# Patient Record
Sex: Female | Born: 1945 | Race: White | Hispanic: No | Marital: Married | Smoking: Former smoker
Health system: Southern US, Community
[De-identification: ages and names within clinical notes are randomized; demographics above are authoritative.]

## PROBLEM LIST (undated history)

## (undated) DIAGNOSIS — I2510 Atherosclerotic heart disease of native coronary artery without angina pectoris: Secondary | ICD-10-CM

## (undated) DIAGNOSIS — J449 Chronic obstructive pulmonary disease, unspecified: Secondary | ICD-10-CM

## (undated) DIAGNOSIS — M1990 Unspecified osteoarthritis, unspecified site: Secondary | ICD-10-CM

## (undated) DIAGNOSIS — I219 Acute myocardial infarction, unspecified: Secondary | ICD-10-CM

## (undated) DIAGNOSIS — G459 Transient cerebral ischemic attack, unspecified: Secondary | ICD-10-CM

## (undated) DIAGNOSIS — T7840XA Allergy, unspecified, initial encounter: Secondary | ICD-10-CM

## (undated) DIAGNOSIS — F32A Depression, unspecified: Secondary | ICD-10-CM

## (undated) DIAGNOSIS — F329 Major depressive disorder, single episode, unspecified: Secondary | ICD-10-CM

## (undated) DIAGNOSIS — M810 Age-related osteoporosis without current pathological fracture: Secondary | ICD-10-CM

## (undated) HISTORY — DX: Age-related osteoporosis without current pathological fracture: M81.0

## (undated) HISTORY — DX: Transient cerebral ischemic attack, unspecified: G45.9

## (undated) HISTORY — DX: Allergy, unspecified, initial encounter: T78.40XA

## (undated) HISTORY — DX: Depression, unspecified: F32.A

## (undated) HISTORY — DX: Chronic obstructive pulmonary disease, unspecified: J44.9

## (undated) HISTORY — PX: HAND SURGERY: SHX662

## (undated) HISTORY — DX: Atherosclerotic heart disease of native coronary artery without angina pectoris: I25.10

## (undated) HISTORY — PX: FOOT SURGERY: SHX648

## (undated) HISTORY — PX: CATARACT EXTRACTION: SUR2

## (undated) HISTORY — DX: Acute myocardial infarction, unspecified: I21.9

## (undated) HISTORY — PX: NASAL SEPTUM SURGERY: SHX37

## (undated) HISTORY — PX: SHOULDER SURGERY: SHX246

## (undated) HISTORY — PX: THORACOTOMY: SUR1349

## (undated) HISTORY — PX: TONSILLECTOMY: SUR1361

## (undated) HISTORY — DX: Major depressive disorder, single episode, unspecified: F32.9

---
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
## 1993-05-05 DIAGNOSIS — G459 Transient cerebral ischemic attack, unspecified: Secondary | ICD-10-CM

## 1993-05-05 HISTORY — DX: Transient cerebral ischemic attack, unspecified: G45.9

## 2003-11-20 ENCOUNTER — Other Ambulatory Visit: Admission: RE | Admit: 2003-11-20 | Discharge: 2003-11-20 | Payer: Self-pay | Admitting: Obstetrics and Gynecology

## 2004-12-19 ENCOUNTER — Other Ambulatory Visit: Admission: RE | Admit: 2004-12-19 | Discharge: 2004-12-19 | Payer: Self-pay | Admitting: Obstetrics and Gynecology

## 2005-01-29 ENCOUNTER — Encounter: Admission: RE | Admit: 2005-01-29 | Discharge: 2005-02-11 | Payer: Self-pay | Admitting: Rheumatology

## 2005-05-23 ENCOUNTER — Ambulatory Visit: Payer: Self-pay | Admitting: Emergency Medicine

## 2005-05-26 ENCOUNTER — Ambulatory Visit: Payer: Self-pay | Admitting: Emergency Medicine

## 2005-06-23 ENCOUNTER — Ambulatory Visit: Payer: Self-pay | Admitting: Emergency Medicine

## 2005-10-29 ENCOUNTER — Ambulatory Visit: Payer: Self-pay | Admitting: Internal Medicine

## 2005-11-14 ENCOUNTER — Encounter (INDEPENDENT_AMBULATORY_CARE_PROVIDER_SITE_OTHER): Payer: Self-pay | Admitting: *Deleted

## 2005-11-14 ENCOUNTER — Ambulatory Visit: Payer: Self-pay | Admitting: Internal Medicine

## 2005-12-22 ENCOUNTER — Other Ambulatory Visit: Admission: RE | Admit: 2005-12-22 | Discharge: 2005-12-22 | Payer: Self-pay | Admitting: Obstetrics and Gynecology

## 2006-10-29 ENCOUNTER — Ambulatory Visit: Payer: Self-pay | Admitting: Emergency Medicine

## 2006-11-30 ENCOUNTER — Ambulatory Visit: Payer: Self-pay | Admitting: Emergency Medicine

## 2007-01-08 DIAGNOSIS — J930 Spontaneous tension pneumothorax: Secondary | ICD-10-CM

## 2007-01-08 DIAGNOSIS — E7800 Pure hypercholesterolemia, unspecified: Secondary | ICD-10-CM

## 2007-01-08 DIAGNOSIS — J939 Pneumothorax, unspecified: Secondary | ICD-10-CM | POA: Insufficient documentation

## 2007-01-08 DIAGNOSIS — R05 Cough: Secondary | ICD-10-CM

## 2007-01-08 DIAGNOSIS — J309 Allergic rhinitis, unspecified: Secondary | ICD-10-CM

## 2007-01-08 DIAGNOSIS — G43909 Migraine, unspecified, not intractable, without status migrainosus: Secondary | ICD-10-CM | POA: Insufficient documentation

## 2007-01-08 DIAGNOSIS — J438 Other emphysema: Secondary | ICD-10-CM | POA: Insufficient documentation

## 2007-01-11 ENCOUNTER — Ambulatory Visit: Payer: Self-pay | Admitting: Emergency Medicine

## 2007-12-22 ENCOUNTER — Ambulatory Visit: Payer: Self-pay | Admitting: Emergency Medicine

## 2008-03-07 ENCOUNTER — Ambulatory Visit: Payer: Self-pay | Admitting: Emergency Medicine

## 2008-03-07 DIAGNOSIS — J449 Chronic obstructive pulmonary disease, unspecified: Secondary | ICD-10-CM

## 2008-09-11 ENCOUNTER — Emergency Department (HOSPITAL_BASED_OUTPATIENT_CLINIC_OR_DEPARTMENT_OTHER): Admission: EM | Admit: 2008-09-11 | Discharge: 2008-09-11 | Payer: Self-pay | Admitting: Emergency Medicine

## 2008-09-21 ENCOUNTER — Encounter: Admission: RE | Admit: 2008-09-21 | Discharge: 2008-09-21 | Payer: Self-pay | Admitting: Family Medicine

## 2009-04-10 ENCOUNTER — Ambulatory Visit: Payer: Self-pay | Admitting: Emergency Medicine

## 2009-04-10 DIAGNOSIS — R079 Chest pain, unspecified: Secondary | ICD-10-CM

## 2009-04-10 LAB — CONVERTED CEMR LAB
BUN: 12 mg/dL (ref 6–23)
CO2: 28 meq/L (ref 19–32)
Calcium: 9.6 mg/dL (ref 8.4–10.5)
GFR calc non Af Amer: 59.46 mL/min (ref >60)
Glucose, Bld: 72 mg/dL (ref 70–99)
Potassium: 3.8 meq/L (ref 3.5–5.1)

## 2009-04-11 ENCOUNTER — Ambulatory Visit: Payer: Self-pay | Admitting: Cardiology

## 2009-04-25 ENCOUNTER — Ambulatory Visit: Payer: Self-pay | Admitting: Emergency Medicine

## 2010-04-15 ENCOUNTER — Ambulatory Visit: Payer: Self-pay | Admitting: Emergency Medicine

## 2010-05-05 HISTORY — PX: CORONARY ANGIOPLASTY WITH STENT PLACEMENT: SHX49

## 2010-05-14 ENCOUNTER — Encounter: Payer: Self-pay | Admitting: Emergency Medicine

## 2010-05-27 ENCOUNTER — Encounter: Payer: Self-pay | Admitting: Family Medicine

## 2010-06-06 NOTE — Assessment & Plan Note (Signed)
Summary: COPD, chronic cough   Visit Type:  Follow-up  CC:  COPD...pt c/o increased dry and hacky cough...no breathing changes.  History of Present Illness: Ms. Deweese is a 65 year old woman with mild COPD and hyperinflation per her pulmonary function testing.  Also with chronic cough, has been treated for GERD and with loratadine/nasal steroid. The GERD therapy did not seem to change the cough, but she may have benefitted from the allergy rx.   ROS: Still with daily cough - paroxysms. Non-productive. Has been rx for allergies before - immunotherapy.  No GERD sx.   ROV 04/10/09 -- has been doing well for the last yr on Spiriva until about 3 -4 weeks ago. At that time she developed R sided CP that feels "deep", worse with inspiration. Some associated SOB, also with more cough. She has been having persistant dry cough, was better for a while without intervention (no longer on an allergy regimen).   ROV 04/25/09 -- returns for f/u cough. CT scan of the chest reassuring, no evidence PE. Still with dry cough. Has been treated for GERD and allergies in the past without great change in the cough.   ROV 04/15/10 -- F/u for her chronic cough, mild COPD. She still has daily cough, cycles of 3-7 days o fterrible paroxysmal cough, then back to her usual cough pattern. Has been treated in past for both GERD and allergies. Taking Spiriva in the am, never needs ProAir, breathing is stable. Not currently on loratadine/nasal steroid. No PND, no GERD signs.   Current Medications (verified): 1)  Topamax 100 Mg  Tabs (Topiramate) .... Take 1 Tablet By Mouth Once A Day 2)  Zoloft 50 Mg  Tabs (Sertraline Hcl) .... Take 1 Tablet By Mouth Once A Day 3)  Boniva 150 Mg  Tabs (Ibandronate Sodium) .... Take 1 Tablet Once Monthly 4)  Multivitamins   Tabs (Multiple Vitamin) .... Take 1 Tablet By Mouth Once A Day 5)  Calcium 600 1500 Mg  Tabs (Calcium Carbonate) .... Take 1 Tablet By Mouth Once A Day 6)  Spiriva Handihaler  18 Mcg  Caps (Tiotropium Bromide Monohydrate) .... Inhale One Capule Using Inhaler Once A Day 7)  Fish Oil 1000 Mg Caps (Omega-3 Fatty Acids) .... 2 By Mouth Daily 8)  Proventil Hfa 108 (90 Base) Mcg/act Aers (Albuterol Sulfate) .... 2 Puffs Four Times Daily As Needed 9)  Vitamin D 2000 Unit Tabs (Cholecalciferol) .Marland Kitchen.. 1 By Mouth Daily  Allergies (verified): 1)  ! Ultram 2)  ! Naprosyn  Vital Signs:  Patient profile:   65 year old female Height:      63 inches (160.02 cm) Weight:      121 pounds (55.00 kg) BMI:     21.51 O2 Sat:      93 % on Room air Temp:     98.0 degrees F (36.67 degrees C) oral Pulse rate:   75 / minute BP sitting:   120 / 76  (right arm) Cuff size:   regular  Vitals Entered By: Michel Bickers CMA (April 15, 2010 2:47 PM)  O2 Sat at Rest %:  93 O2 Flow:  Room air CC: COPD...pt c/o increased dry,  hacky cough...no breathing changes Comments Medications reviewed with patient Michel Bickers CMA  April 15, 2010 2:57 PM   Physical Exam  General:  well developed, well nourished, in no acute distress Head:  normocephalic and atraumatic Eyes:  conjunctiva and sclera clear Nose:  no deformity, discharge, inflammation, or lesions Mouth:  no deformity or lesions Neck:  no masses, thyromegaly, or abnormal cervical nodes Chest Wall:  no deformities noted, not tender to palp Lungs:  distant, clear bilaterally to auscultation and percussion Heart:  regular rate and rhythm, S1, S2 without murmurs, rubs, gallops, or clicks Abdomen:  not examined Msk:  no deformity or scoliosis noted with normal posture Extremities:  no clubbing, cyanosis, edema, or deformity noted; no cords Neurologic:  non-focal Skin:  intact without lesions or rashes Psych:  alert and cooperative; normal mood and affect; normal attention span and concentration   Impression & Recommendations:  Problem # 1:  COPD (ICD-496)  Problem # 2:  COUGH, CHRONIC (ICD-786.2) believe we need to r/o any  anatomical cause or abnormality, will refer for ENT eval consider restarting allergy and GERD regimens.   Medications Added to Medication List This Visit: 1)  Fish Oil 1000 Mg Caps (Omega-3 fatty acids) .... 2 by mouth daily 2)  Vitamin D 2000 Unit Tabs (Cholecalciferol) .Marland Kitchen.. 1 by mouth daily  Other Orders: ENT Referral (ENT) Est. Patient Level IV (27253)  Patient Instructions: 1)  Please continue your Spiriva once daily and Proventil  2)  We will refer you to ENT to evaluate your cough, examine your throat with laryngoscopy.  3)  Follow up with Dr Delton Coombes in 1 yr or as needed  Prescriptions: PROVENTIL HFA 108 (90 BASE) MCG/ACT AERS (ALBUTEROL SULFATE) 2 puffs four times daily as needed  #1 x 5   Entered and Authorized by:   Leslye Peer MD   Signed by:   Leslye Peer MD on 04/15/2010   Method used:   Electronically to        [facility]  [address] [phone]* (retail)       [address] [place], [place]  [address]       Ph: [phone] or [phone]       Fax: [phone]   RxID:   [id number] SPIRIVA HANDIHALER 18 MCG  CAPS (TIOTROPIUM BROMIDE MONOHYDRATE) Inhale one capule using inhaler once a day  #30 x 11   Entered and Authorized by:   Leslye Peer MD   Signed by:   Leslye Peer MD on 04/15/2010   Method used:   Electronically to        [facility]  [address] [phone]* (retail)       [address] [place], [place]  [address]       Ph: [phone] or [phone]       Fax: [phone]   RxID:   [id number]    Immunization History:  Influenza Immunization History:    Influenza:  historical (02/02/2010)

## 2010-06-06 NOTE — Consult Note (Signed)
Summary: [facility]  [facility]   Imported By: Lennie Odor 05/31/2010 14:07:52  _____________________________________________________________________  External Attachment:    Type:   Image     Comment:   External Document

## 2010-07-23 ENCOUNTER — Telehealth: Payer: Self-pay | Admitting: Emergency Medicine

## 2010-07-23 NOTE — Telephone Encounter (Signed)
Pt needs either an OV or to go to the ED to be evaluated.

## 2010-07-23 NOTE — Telephone Encounter (Signed)
Called and spoke with pt and she c/o discomfort on her right side and the middle of her back and some increased SOB x 3-4 weeks. Patient rate pain 4/10. Pt is not taking any medications for the pain. Pt would like Dr. Kavin Leech recommendations. Please advise. Thanks. Carver Fila, MA  Allergies  Allergen Reactions  . Naproxen   . Tramadol Hcl

## 2010-07-23 NOTE — Telephone Encounter (Signed)
Called and spoke with pt and advised her of RB recs. She verbalized understanding and states she will go to the ED if her pain gets worse. Pt made an apt to see TP 07/30/10 at 2:00 [place], [place]

## 2010-07-29 ENCOUNTER — Encounter: Payer: Self-pay | Admitting: Adult Health

## 2010-07-30 ENCOUNTER — Ambulatory Visit (INDEPENDENT_AMBULATORY_CARE_PROVIDER_SITE_OTHER): Payer: BC Managed Care – PPO | Admitting: Adult Health

## 2010-07-30 ENCOUNTER — Ambulatory Visit (INDEPENDENT_AMBULATORY_CARE_PROVIDER_SITE_OTHER)
Admission: RE | Admit: 2010-07-30 | Discharge: 2010-07-30 | Disposition: A | Discharge status: Home / Self Care | Payer: BC Managed Care – PPO | Source: Ambulatory Visit | Attending: Adult Health | Admitting: Adult Health

## 2010-07-30 ENCOUNTER — Encounter: Payer: Self-pay | Admitting: Adult Health

## 2010-07-30 VITALS — BP 112/66 | HR 87 | Temp 98.6°F | Ht 62.5 in | Wt 118.8 lb

## 2010-07-30 DIAGNOSIS — R071 Chest pain on breathing: Secondary | ICD-10-CM

## 2010-07-30 DIAGNOSIS — R0781 Pleurodynia: Secondary | ICD-10-CM | POA: Insufficient documentation

## 2010-07-30 DIAGNOSIS — R05 Cough: Secondary | ICD-10-CM

## 2010-07-30 NOTE — Assessment & Plan Note (Signed)
Controlled at present  No evidence of active infection  cxr pending.   Plan:  Cont on same meds.

## 2010-07-30 NOTE — Assessment & Plan Note (Signed)
Right sided pleuritic chest pain ?etiology  Plan:  Check cxr today  Ibuprofen 200mg  3 tabs Twice daily  With food for 5 days Warm heat to right side Twice daily  As needed   Please contact office for sooner follow up if symptoms do not improve or worsen or seek emergency care   follow up .Dr. Delton Coombes as planned and As needed

## 2010-07-30 NOTE — Progress Notes (Signed)
Subjective:    Patient ID: [name], female    DOB: 07/24/1945, 65 y.o.   MRN: [id number]  HPI Comments: [name] is a 65 year old woman with mild COPD and hyperinflation per her pulmonary function testing.  Also with chronic cough, has been treated for GERD and with loratadine/nasal steroid. The GERD therapy did not seem to change the cough, but she may have benefitted from the allergy rx.     ROS: Still with daily cough - paroxysms. Non-productive. Has been rx for allergies before - immunotherapy.  No GERD sx.     ROV 04/10/09 -- has been doing well for the last yr on Spiriva until about 3 -4 weeks ago. At that time she developed R sided CP that feels "deep", worse with inspiration. Some associated SOB, also with more cough. She has been having persistant dry cough, was better for a while without intervention (no longer on an allergy regimen).     ROV 04/25/09 -- returns for f/u cough. CT scan of the chest reassuring, no evidence PE. Still with dry cough. Has been treated for GERD and allergies in the past without great change in the cough.     ROV 04/15/10 -- F/u for her chronic cough, mild COPD. She still has daily cough, cycles of 3-7 days o fterrible paroxysmal cough, then back to her usual cough pattern. Has been treated in past for both GERD and allergies. Taking Spiriva in the am, never needs ProAir, breathing is stable. Not currently on loratadine/nasal steroid. No PND, no GERD signs.  >>referred to ENT w/ nml laryngoscope, tx w/ PPI and neurontin-unable to tolerate neurontin.   07/30/2010 Acute OV- Presents for an acute office visit. Complains of 4 weeks of right sided chest pain that occurs when she takes deep breath or coughs. Mild in nature but has not resolved. NO meds used. She has hx of chronic cough - it is at baseline. Cough is dry w/ no discolored mucus. NO exertional chest pain, syncope, palpitations, recent travel or calf swelling/pain. Nontender to touch, and does not  reproduce with movement except deep inspiration. Last mammogram 04/2010 nml per pt.       Review of Systems ROS   Constitutional:   No  weight loss, night sweats,  Fevers, chills, fatigue, lassitude. HEENT:   No headaches,  Difficulty swallowing,  Tooth/dental problems,  Sore throat,                No sneezing, itching, ear ache, nasal congestion, post nasal drip,   CV:  No chest pain,  Orthopnea, PND, swelling in lower extremities, anasarca, dizziness, palpitations  GI  No heartburn, indigestion, abdominal pain, nausea, vomiting, diarrhea, change in bowel habits, loss of appetite  Resp: No shortness of breath with exertion or at rest.  No excess mucus, no productive cough,  No non-productive cough,  No coughing up of blood.  No change in color of mucus.  No wheezing.  No chest wall deformity  Skin: no rash or lesions.  GU: no dysuria, change in color of urine, no urgency or frequency.  No flank pain.  MS:  No joint pain or swelling.  No decreased range of motion.  No back pain.  Psych:  No change in mood or affect. No depression or anxiety.  No memory loss.    Objective:   Physical Exam Gen: Pleasant, well-nourished, in no distress,  normal affect  ENT: No lesions,  mouth clear,  oropharynx clear, no postnasal drip  Neck:  No JVD, no TMG, no carotid bruits  Lungs: No use of accessory muscles, no dullness to percussion, clear without rales or rhonchi Chest wall nontender - no eccymosis noted.  No axillary adenopathy.   Cardiovascular: RRR, heart sounds normal, no murmur or gallops, no peripheral edema  Abdomen: soft and NT, no HSM,  BS normal  Musculoskeletal: No deformities, no cyanosis or clubbing Cervical rom nm w/ no radicular symptoms   Neuro: alert, non focal  Skin: Warm, no lesions or rashes          Assessment & Plan:

## 2010-07-30 NOTE — Patient Instructions (Signed)
Ibuprofen 200mg  3 tabs Twice daily  With food for 5 days Warm heat to right side Twice daily  As needed   Please contact office for sooner follow up if symptoms do not improve or worsen or seek emergency care   follow up .Dr. Delton Coombes as planned and As needed

## 2010-08-01 ENCOUNTER — Telehealth: Payer: Self-pay | Admitting: Adult Health

## 2010-08-01 NOTE — Telephone Encounter (Signed)
Spoke with pt and notified of results/recs of cxr per TP.  Pt verbalized understanding.

## 2010-08-01 NOTE — Progress Notes (Signed)
Quick Note:  LMOM TCB x1. ______ 

## 2010-08-13 LAB — URINE MICROSCOPIC-ADD ON

## 2010-08-13 LAB — URINALYSIS, ROUTINE W REFLEX MICROSCOPIC
Bilirubin Urine: NEGATIVE
Nitrite: NEGATIVE
Specific Gravity, Urine: 1.018 (ref 1.005–1.030)
Urobilinogen, UA: 0.2 mg/dL (ref 0.0–1.0)

## 2010-09-17 NOTE — Assessment & Plan Note (Signed)
[name]                             PULMONARY OFFICE NOTE   [name], [name]                        MRN:          [id number]  DATE:11/30/2006                            DOB:          1945-05-18    SUBJECTIVE:  [name] is a 65 year old woman with mild airflow  limitations, consistent with COPD.  She was last seen on October 29, 2006,  at which times we started her on Spiriva, one inhalation daily.  She has  been using the Spiriva for the last 3 weeks.  She believes that it may  have helped her breathing to some small degree.  She had been  complaining of some exertional dyspnea.  Over the last few days, she has  noticed some cough when she takes the Spiriva.  This has been happening  for about the last 4 days.  Initially, she was taking the Spiriva  without difficulty.  She has had no other change in symptoms.   MEDICATIONS:  1. Topamax 100 mg daily.  2. Zoloft 50 mg daily.  3. Actonel 35 mg weekly.  4. Multivitamin once daily.  5. Calcium once daily.  6. Spiriva 1 inhalation daily.   EXAMINATION:  GENERAL:  This is a pleasant, thin woman who is in no  distress.  HEENT:  Benign.  LUNGS:  Distant but clear.  HEART:  Regular without murmur.  ABDOMEN:  Benign, positive bowel sounds.  EXTREMITIES:  Have no cyanosis, clubbing or edema.  NEUROLOGIC:  She has a nonfocal exam.   IMPRESSION:  1. Mild airflow limitation and mild emphysema.  2. Cough.   PLANS:  1. I will continue her Spiriva.  At this time, she will return to see      me in 6 to 8 weeks, and at that visit we will decided whether we      will continue the medication, depending on her clinical      improvement.  2. I will consider empiric therapy for allergic rhinitis, if we      believe that her post nasal drip is a component of her cough.     Cynthia Peer, MD  Electronically Signed    RSB/MedQ  DD: 12/07/2006  DT: 12/08/2006  Job #: [id number]

## 2010-09-17 NOTE — Assessment & Plan Note (Signed)
[facility]                             PULMONARY OFFICE NOTE   [name], [name]                        MRN:          [id number]  DATE:10/29/2006                            DOB:          [birth year]    SUBJECTIVE:  Ms. Mcconahy is a pleasant 65 year old woman seen in February  2007 for mild airflow limitation and some emphysematous changes on CT  scan of the chest. At that time, her breathing was doing fairly well and  we did not start any standing bronchodilators. Since then, she has begun  to experience some exertional dyspnea that is out of proportion to her  usual shortness of breath, in particular her breathing has been more  difficult for the last 3-4 months. She is still able to do her usual  activities, but she becomes short of breath more easily. She also  continues to have dry cough that occurs on a daily basis. She denies any  wheezing. She notes that exertion does occasionally make her cough. She  has had some unintentional weight loss and if she wonders if this may be  due to Topamax. Her weight seems to have stabilized over the last  several months.   CURRENT MEDICATIONS:  1. Topamax 100 mg daily.  2. Zoloft 50 mg daily.  3. Actonel 35 mg weekly.  4. Multivitamin once daily.  5. Calcium once daily.   PHYSICAL EXAMINATION:  GENERAL:  This is a thin pleasant woman in no  distress.  VITAL SIGNS:  Weight 117 pounds, temperature 98.0, blood pressure  132/82, heart rate 71, SPO2 95% on room air.  HEENT:  The oropharynx is benign. There is no posterior pharyngeal  erythema.  NECK:  Without lymphadenopathy or strider.  LUNGS:  Distant, but without any wheezing or crackles.  HEART:  Regular without murmur.  ABDOMEN:  Benign.  EXTREMITIES:  No cyanosis, clubbing, or edema.   IMPRESSION:  1. Increasing shortness of breath in a patient with mild airflow      limitation and changes consistent with emphysema.  2. Chronic cough.   PLAN:  1. I would like to initiate a trial of Spiriva 1 inhalation daily and      albuterol as needed.  2. I will perform a chest x-ray to look for any interval change.  3. I will follow up with Ms. Briddell in one month to assess her      improvement on standing bronchodilator.     Leslye Peer, MD  Electronically Signed    RSB/MedQ  DD: 11/01/2006  DT: 11/02/2006  Job #: [id number]   cc:   [name], M.D.

## 2010-09-17 NOTE — Assessment & Plan Note (Signed)
[facility]                             PULMONARY OFFICE NOTE   [name], [name]                        MRN:          [id number]  DATE:01/11/2007                            DOB:          1946/03/27    SUBJECTIVE:  [name] is a 65 year old woman with mild COPD and  hyperinflation per her pulmonary function testing.  She returns today  for a regularly scheduled follow-up visit.  We have started her on  Spiriva to see if this helps with her mild exertional dyspnea.  She is  tolerating medication well and is no longer having cough at the time  that she takes the Spiriva.  She believes that her breathing is somewhat  improved since she began the medication.  In particular, her exercise  tolerance is better.  She continues to have daily cough that is now  productive.  At times, this can be associated with exercise or when she  becomes overheated.  Some exposures including cigarette smoke also  start her cough.  She denies any significant nasal symptoms or post  nasal drip or throat irritation.   CURRENT MEDICATIONS:  1. Topamax 100 mg daily.  2. Zoloft 50 mg daily.  3. Multivitamin once daily.  4. Calcium once daily.  5. Spiriva one inhalation daily.  6. Actonel 35 mg monthly.   PHYSICAL EXAMINATION:  VITAL SIGNS:  Weight is 117 pounds, temperature  98.1, blood pressure 110/64, heart rate 68,  SpO2 94% on room air.  GENERAL APPEARANCE:  This is a pleasant, thin, well-appearing woman in  no distress.  HEENT:  The oropharynx is moist, benign.  There is no posterior  pharyngeal erythema.  NECK:  Supple without lymphadenopathy or stridor.  LUNGS:  Clear to auscultation bilaterally.  She does not wheeze on a  forced expiration.  CARDIOVASCULAR:  Regular without murmur.  ABDOMEN:  Soft, nontender, nondistended with positive bowel sounds.  EXTREMITIES:  No clubbing, cyanosis, or edema.  NEUROLOGIC:  Nonfocal exam.   IMPRESSION:  1. Mild chronic  obstructive pulmonary disease with hyperinflation.  2. Daily cough.   PLAN:  1. She would like to continue her Spiriva as it has given her some      clinical benefit.  I will also give her albuterol to use on an as      needed basis.  2. She does not want to treat allergic rhinitis at this time as a      possible contributor to her cough but we discussed this possibility      and she is open to treatment in the future if her cough worsens.  3. I will follow up with [name] in one year or sooner should she      have any difficulty in the interim.     Leslye Peer, MD  Electronically Signed    RSB/MedQ  DD: 01/11/2007  DT: 01/11/2007  Job #: [id number]   cc:   [name], M.D.

## 2010-11-28 ENCOUNTER — Telehealth: Payer: Self-pay | Admitting: Emergency Medicine

## 2010-11-28 MED ORDER — TIOTROPIUM BROMIDE MONOHYDRATE 18 MCG IN CAPS: 18.0000 ug | ORAL_CAPSULE | Freq: Every day | RESPIRATORY_TRACT | Status: DC

## 2010-11-28 NOTE — Telephone Encounter (Signed)
Spoke with pt. She states that she will be changing pharmacies to [facility] and needs written rx for spiriva for 90 day supply. I offered to send this electronically, but she wants to come pick up rx as she is unsure when she is going to register with [facility]. I advised that we will print rx and have RB sign when he returns to the office on 7/30 and then call her when ready to pick up. Pt verbalized understanding.

## 2010-12-02 NOTE — Telephone Encounter (Signed)
Signed rx placed in envelope for pt to pick up at her convenience.  LM on named VM informing pt that her rx is ready to be picked up at her convenience.

## 2010-12-02 NOTE — Telephone Encounter (Signed)
Script is signed and ready to be picked up

## 2010-12-04 DIAGNOSIS — I2510 Atherosclerotic heart disease of native coronary artery without angina pectoris: Secondary | ICD-10-CM

## 2010-12-04 DIAGNOSIS — I219 Acute myocardial infarction, unspecified: Secondary | ICD-10-CM

## 2010-12-04 HISTORY — DX: Acute myocardial infarction, unspecified: I21.9

## 2010-12-04 HISTORY — DX: Atherosclerotic heart disease of native coronary artery without angina pectoris: I25.10

## 2010-12-28 ENCOUNTER — Encounter (HOSPITAL_BASED_OUTPATIENT_CLINIC_OR_DEPARTMENT_OTHER): Payer: Self-pay | Admitting: *Deleted

## 2010-12-28 ENCOUNTER — Emergency Department (HOSPITAL_BASED_OUTPATIENT_CLINIC_OR_DEPARTMENT_OTHER)
Admission: EM | Admit: 2010-12-28 | Discharge: 2010-12-28 | Disposition: A | Discharge status: Other Acute Inpatient Hospital | Payer: Medicare Other | Source: Home / Self Care | Attending: Emergency Medicine | Admitting: Emergency Medicine

## 2010-12-28 ENCOUNTER — Other Ambulatory Visit: Payer: Self-pay

## 2010-12-28 ENCOUNTER — Inpatient Hospital Stay (HOSPITAL_COMMUNITY)
Admission: AD | Admit: 2010-12-28 | Discharge: 2010-12-31 | DRG: 247 | Disposition: A | Discharge status: Home / Self Care | Payer: Medicare Other | Source: Other Acute Inpatient Hospital | Attending: Internal Medicine | Admitting: Internal Medicine

## 2010-12-28 ENCOUNTER — Emergency Department (INDEPENDENT_AMBULATORY_CARE_PROVIDER_SITE_OTHER): Payer: Medicare Other

## 2010-12-28 DIAGNOSIS — F329 Major depressive disorder, single episode, unspecified: Secondary | ICD-10-CM | POA: Insufficient documentation

## 2010-12-28 DIAGNOSIS — J438 Other emphysema: Secondary | ICD-10-CM | POA: Insufficient documentation

## 2010-12-28 DIAGNOSIS — R079 Chest pain, unspecified: Secondary | ICD-10-CM | POA: Insufficient documentation

## 2010-12-28 DIAGNOSIS — Z8739 Personal history of other diseases of the musculoskeletal system and connective tissue: Secondary | ICD-10-CM | POA: Insufficient documentation

## 2010-12-28 DIAGNOSIS — I219 Acute myocardial infarction, unspecified: Secondary | ICD-10-CM

## 2010-12-28 DIAGNOSIS — Z8679 Personal history of other diseases of the circulatory system: Secondary | ICD-10-CM | POA: Insufficient documentation

## 2010-12-28 DIAGNOSIS — J449 Chronic obstructive pulmonary disease, unspecified: Secondary | ICD-10-CM | POA: Diagnosis present

## 2010-12-28 DIAGNOSIS — I959 Hypotension, unspecified: Secondary | ICD-10-CM | POA: Diagnosis present

## 2010-12-28 DIAGNOSIS — I2510 Atherosclerotic heart disease of native coronary artery without angina pectoris: Secondary | ICD-10-CM

## 2010-12-28 DIAGNOSIS — G43909 Migraine, unspecified, not intractable, without status migrainosus: Secondary | ICD-10-CM | POA: Diagnosis present

## 2010-12-28 DIAGNOSIS — R110 Nausea: Secondary | ICD-10-CM

## 2010-12-28 DIAGNOSIS — F3289 Other specified depressive episodes: Secondary | ICD-10-CM | POA: Insufficient documentation

## 2010-12-28 DIAGNOSIS — R0789 Other chest pain: Secondary | ICD-10-CM

## 2010-12-28 DIAGNOSIS — M129 Arthropathy, unspecified: Secondary | ICD-10-CM | POA: Diagnosis present

## 2010-12-28 DIAGNOSIS — Z8673 Personal history of transient ischemic attack (TIA), and cerebral infarction without residual deficits: Secondary | ICD-10-CM

## 2010-12-28 DIAGNOSIS — K219 Gastro-esophageal reflux disease without esophagitis: Secondary | ICD-10-CM | POA: Diagnosis present

## 2010-12-28 DIAGNOSIS — I214 Non-ST elevation (NSTEMI) myocardial infarction: Principal | ICD-10-CM | POA: Diagnosis present

## 2010-12-28 DIAGNOSIS — J4489 Other specified chronic obstructive pulmonary disease: Secondary | ICD-10-CM | POA: Diagnosis present

## 2010-12-28 DIAGNOSIS — E785 Hyperlipidemia, unspecified: Secondary | ICD-10-CM | POA: Diagnosis present

## 2010-12-28 HISTORY — DX: Unspecified osteoarthritis, unspecified site: M19.90

## 2010-12-28 LAB — BASIC METABOLIC PANEL
BUN: 18 mg/dL (ref 6–23)
Creatinine, Ser: 0.7 mg/dL (ref 0.50–1.10)
GFR calc non Af Amer: >60 mL/min (ref >60)
Glucose, Bld: 110 mg/dL — ABNORMAL HIGH (ref 70–99)
Potassium: 3.7 mEq/L (ref 3.5–5.1)

## 2010-12-28 LAB — COMPREHENSIVE METABOLIC PANEL
AST: 54 U/L — ABNORMAL HIGH (ref 0–37)
Albumin: 3.4 g/dL — ABNORMAL LOW (ref 3.5–5.2)
Alkaline Phosphatase: 59 U/L (ref 39–117)
Chloride: 114 mEq/L — ABNORMAL HIGH (ref 96–112)
Potassium: 3.3 mEq/L — ABNORMAL LOW (ref 3.5–5.1)
Total Bilirubin: 0.3 mg/dL (ref 0.3–1.2)

## 2010-12-28 LAB — CBC
HCT: 35.5 % — ABNORMAL LOW (ref 36.0–46.0)
Hemoglobin: 13 g/dL (ref 12.0–15.0)
MCH: 29 pg (ref 26.0–34.0)
MCHC: 32.9 g/dL (ref 30.0–36.0)
MCHC: 32.1 g/dL (ref 30.0–36.0)
MCV: 88.8 fL (ref 78.0–100.0)
RDW: 14.7 % (ref 11.5–15.5)

## 2010-12-28 LAB — CARDIAC PANEL(CRET KIN+CKTOT+MB+TROPI)
Relative Index: 8 — ABNORMAL HIGH (ref 0.0–2.5)
Troponin I: 9.48 ng/mL (ref <0.30)

## 2010-12-28 LAB — TROPONIN I: Troponin I: 3.55 ng/mL (ref <0.30)

## 2010-12-28 MED ORDER — HEPARIN (PORCINE) IN NACL 100-0.45 UNIT/ML-% IJ SOLN
14.0000 [IU]/kg/h | INTRAMUSCULAR | Status: DC
  Administered 2010-12-28: 14 [IU]/kg/h via INTRAVENOUS
  Filled 2010-12-28: qty 250

## 2010-12-28 MED ORDER — NITROGLYCERIN IN D5W 200-5 MCG/ML-% IV SOLN
2.0000 ug/min | Freq: Once | INTRAVENOUS | Status: AC
  Administered 2010-12-28: 5 ug/min via INTRAVENOUS

## 2010-12-28 MED ORDER — SODIUM CHLORIDE 0.9 % IV SOLN
Freq: Once | INTRAVENOUS | Status: AC
  Administered 2010-12-28: 14:00:00 via INTRAVENOUS

## 2010-12-28 MED ORDER — HEPARIN BOLUS VIA INFUSION
3000.0000 [IU] | Freq: Once | INTRAVENOUS | Status: AC
  Administered 2010-12-28: 3000 [IU] via INTRAVENOUS
  Filled 2010-12-28: qty 3000

## 2010-12-28 MED ORDER — NITROGLYCERIN IN D5W 200-5 MCG/ML-% IV SOLN
INTRAVENOUS | Status: AC
  Administered 2010-12-28: 5 ug/min via INTRAVENOUS
  Filled 2010-12-28: qty 250

## 2010-12-28 MED ORDER — ASPIRIN 81 MG PO CHEW
324.0000 mg | CHEWABLE_TABLET | Freq: Once | ORAL | Status: AC
  Administered 2010-12-28: 324 mg via ORAL
  Filled 2010-12-28: qty 4

## 2010-12-28 MED ORDER — MORPHINE SULFATE 4 MG/ML IJ SOLN: 4.0000 mg | Freq: Once | INTRAMUSCULAR | Status: DC

## 2010-12-28 MED ORDER — NITROGLYCERIN 0.4 MG SL SUBL
0.4000 mg | SUBLINGUAL_TABLET | SUBLINGUAL | Status: AC | PRN
  Administered 2010-12-28 (×3): 0.4 mg via SUBLINGUAL
  Filled 2010-12-28: qty 25

## 2010-12-28 NOTE — ED Notes (Signed)
Patient states that after lunch yesterday she started having mid chest pressure that continued throughout the night and this morning was experiencing nausea along with chest pressure

## 2010-12-28 NOTE — ED Provider Notes (Addendum)
History     CSN: [id number] Arrival date & time: 12/28/2010 12:01 PM  Chief Complaint  Patient presents with  . Chest Pain   Patient is a 65 y.o. female presenting with chest pain.  Chest Pain Primary symptoms include fatigue.    Complains of chest pain onset yesterday afternoon described as a heaviness radiating to both arms and generalized weakness, feeling "tired" as admits to slight sweatiness this morning no treatment prior to coming here nothing makes symptoms better or worse. Has never had similar discomfort before .also reports nausea but is more Past Medical History  Diagnosis Date  . Emphysema   . Depression   . Migraine   . TIA (transient ischemic attack) 1995  . Arthritis     Past Surgical History  Procedure Date  . Appendectomy   . Tonsillectomy   . Thoracotomy   . Foot surgery   . Nasal septum surgery   . Shoulder surgery   . Hand surgery     Family History  Problem Relation Age of Onset  . Heart disease Paternal Grandmother   . Heart disease Paternal Grandfather   . Rheum arthritis Maternal Grandmother   . Rheum arthritis Maternal Grandfather   . Lung cancer Maternal Uncle   . Diabetes Mother   . Diabetes Maternal Aunt   . Diabetes Maternal Grandmother     History  Substance Use Topics  . Smoking status: Former Smoker -- 1.0 packs/day for 10 years    Types: Cigarettes    Quit date: 05/06/1971  . Smokeless tobacco: Not on file  . Alcohol Use: No   Quit smoking age 16 OB History    Grav Para Term Preterm Abortions TAB SAB Ect Mult Living                  Review of Systems  Constitutional: Positive for fatigue.  Cardiovascular: Positive for chest pain.       Diaphoresis    Physical Exam  BP 138/79  Pulse 74  Temp(Src) 98.4 F (36.9 C) (Oral)  Resp 18  SpO2 96%  Physical Exam  Nursing note and vitals reviewed. Constitutional: She appears well-developed and well-nourished.  HENT:  Head: Normocephalic and atraumatic.  Eyes:  Conjunctivae are normal. Pupils are equal, round, and reactive to light.  Neck: Neck supple. No tracheal deviation present. No thyromegaly present.  Cardiovascular: Normal rate and regular rhythm.   No murmur heard. Pulmonary/Chest: Effort normal and breath sounds normal.  Abdominal: Soft. Bowel sounds are normal. She exhibits no distension. There is no tenderness.  Musculoskeletal: Normal range of motion. She exhibits no edema and no tenderness.  Neurological: She is alert. Coordination normal.  Skin: Skin is warm and dry. No rash noted.  Psychiatric: She has a normal mood and affect.   Date: 12/28/2010  Rate: 75  Rhythm: normal sinus rhythm  QRS Axis: normal  Intervals: normal  ST/T Wave abnormalities: ST depressions inferiorly  Conduction Disutrbances:none  Narrative Interpretation:   Old EKG Reviewed: none available    ED Course  CRITICAL CARE Performed by: Doug Sou Authorized by: Doug Sou Total critical care time: 30 minutes Critical care time was exclusive of separately billable procedures and treating other patients. Critical care was necessary to treat or prevent imminent or life-threatening deterioration of the following conditions: cardiac failure, circulatory failure and shock. Critical care was time spent personally by me on the following activities: discussions with consultants, obtaining history from patient or surrogate, examination of patient and discussions  with primary provider.   Patient received 3 sublingual nitroglycerin tablets with minimal improvement in discomfort headache at 1400 hrs. blood pressure noted to be 98/51 she received a normal saline bolus 500 mL and venously which brought pressure up to 113/60 at 1450 p.m. intravenous nitroglycerin drip started at 5 mcg per minute heparin ordered per pharmacy protocol 3000 unit intravenous bolus and started on a drip of 750 no 750 units per hour. Spoke with Dr. Domingo Madeira ED accepts patient in  transfer to [facility] step down unit. Explained I explained to patient and her spouse that she has suffered a myocardial infarction MDM   Plan transfer to [facility], step down unit heparin and nitroglycerin intravenous drips aspirin    Doug Sou, MD 12/28/10 1514  Doug Sou, MD 12/28/10 [phone]

## 2010-12-28 NOTE — ED Notes (Signed)
Nitro rate increased to 10 mcg/min  -

## 2010-12-29 DIAGNOSIS — I214 Non-ST elevation (NSTEMI) myocardial infarction: Secondary | ICD-10-CM

## 2010-12-29 LAB — BASIC METABOLIC PANEL
CO2: 24 mEq/L (ref 19–32)
CO2: 21 mEq/L (ref 19–32)
Calcium: 8 mg/dL — ABNORMAL LOW (ref 8.4–10.5)
Calcium: 8.6 mg/dL (ref 8.4–10.5)
Creatinine, Ser: 0.63 mg/dL (ref 0.50–1.10)
GFR calc Af Amer: >60 mL/min (ref >60)
GFR calc Af Amer: >60 mL/min (ref >60)
GFR calc non Af Amer: >60 mL/min (ref >60)
Sodium: 139 mEq/L (ref 135–145)

## 2010-12-29 LAB — CARDIAC PANEL(CRET KIN+CKTOT+MB+TROPI)
Relative Index: 4.2 — ABNORMAL HIGH (ref 0.0–2.5)
Total CK: 420 U/L — ABNORMAL HIGH (ref 7–177)
Total CK: 394 U/L — ABNORMAL HIGH (ref 7–177)
Troponin I: 8.07 ng/mL (ref <0.30)

## 2010-12-29 LAB — CBC
MCH: 28.6 pg (ref 26.0–34.0)
MCV: 90.2 fL (ref 78.0–100.0)
Platelets: 229 10*3/uL (ref 150–400)
RDW: 14.8 % (ref 11.5–15.5)

## 2010-12-29 LAB — HEMOGLOBIN A1C: Hgb A1c MFr Bld: 6.1 % — ABNORMAL HIGH (ref <5.7)

## 2010-12-30 LAB — POCT ACTIVATED CLOTTING TIME
Activated Clotting Time: 155 seconds
Activated Clotting Time: 237 seconds
Activated Clotting Time: 270 seconds
Activated Clotting Time: 391 seconds

## 2010-12-30 LAB — CBC
MCV: 89.5 fL (ref 78.0–100.0)
Platelets: 182 10*3/uL (ref 150–400)
RBC: 3.23 MIL/uL — ABNORMAL LOW (ref 3.87–5.11)
WBC: 5.7 10*3/uL (ref 4.0–10.5)

## 2010-12-30 LAB — BASIC METABOLIC PANEL
CO2: 23 mEq/L (ref 19–32)
Chloride: 113 mEq/L — ABNORMAL HIGH (ref 96–112)
Potassium: 3.7 mEq/L (ref 3.5–5.1)
Sodium: 141 mEq/L (ref 135–145)

## 2010-12-31 LAB — BASIC METABOLIC PANEL
BUN: 10 mg/dL (ref 6–23)
Calcium: 8.9 mg/dL (ref 8.4–10.5)
GFR calc Af Amer: >60 mL/min (ref >60)
GFR calc non Af Amer: >60 mL/min (ref >60)
Potassium: 4 mEq/L (ref 3.5–5.1)
Sodium: 144 mEq/L (ref 135–145)

## 2010-12-31 LAB — CBC
MCH: 28.2 pg (ref 26.0–34.0)
MCHC: 31.8 g/dL (ref 30.0–36.0)
RDW: 14.8 % (ref 11.5–15.5)

## 2010-12-31 NOTE — Cardiovascular Report (Signed)
[name], [name]               ACCOUNT NO.:  [id number]  MEDICAL RECORD NO.:  [id number]  LOCATION:  2915                         FACILITY:  [facility]  PHYSICIAN:  Verne Carrow, MDDATE OF BIRTH:  06/13/1945  DATE OF PROCEDURE:  12/28/2010 DATE OF DISCHARGE:                           CARDIAC CATHETERIZATION   PROCEDURES PERFORMED: 1. Left heart catheterization. 2. Selective coronary angiography. 3. Left ventricular angiogram. 4. Percutaneous transluminal coronary angioplasty with placement of a     drug-eluting stent in mid left anterior descending. 5. Placement of an Angio-Seal femoral artery closure device.  OPERATOR:  Verne Carrow, MD  INDICATIONS:  This is a 65 year old Caucasian female with a history of GERD and remote tobacco use who presented to the hospital earlier today with chest pain.  Her EKG showed some ST depression.  The patient's troponin was elevated.  The patient had recurrent chest discomfort and because of this we decided to perform an urgent catheterization.  DETAILS OF PROCEDURE:  The patient was brought to the Main Catheterization Laboratory after signing informed consent for the procedure.  The right groin was prepped and draped in a sterile fashion. 1% lidocaine was used for local anesthesia.  A 6-French sheath was inserted into the right femoral artery without difficulty.  Standard diagnostic catheters were used to perform selective coronary angiography.  A pigtail catheter was used to perform a left ventricular angiogram.  We elected to proceed with intervention of the severe stenosis in the mid LAD.  A 3.0 XB guiding catheter was used to selectively engage the left main artery.  The patient had been on heparin and Integrilin before coming to the Cath Lab.  I decided to use this anticoagulation regimen and antiplatelet regimen.  We did give her 600 mg of Plavix on the cath table.  Additional 3500 units of heparin was given at the  start of the intervention secondary to her ACT being 155.  We then had an error on machine and gave an extra 1500 units of heparin.  At this point, the ACT was greater than 200.  We passed a Cougar intracoronary wire down the LAD into the distal vessel.  This was somewhat difficult given the severe stenosis located just after a large diagonal branch and also involving a septal perforator just at the distal edge of the stenosis.  Ultimately, we were able to get the wire across the lesion.  A 2.5 x 12 mm balloon was inflated in lesion, but the balloon moved.  We then placed a 2.5 x 15 mm balloon in the lesion and it inflated without difficulty.  A 2.5 x 20 mm Promus Element drug- eluting stent was carefully positioned in the midvessel and deployed.  A 2.5 x 15 mm noncompliant balloon was carefully positioned inside the stent and deployed without difficulty.  The stenosis was taken from 99% down to 0%.  There was an excellent angiographic result.  The patient had no complications.  She was stable at the end of the case.  An Angio- Seal femoral artery closure device was placed in the right femoral artery.  The patient was taken to the CCU in stable condition.  HEMODYNAMIC FINDINGS:  Central aortic pressure  122/70.  Left ventricular pressure 112/10/18.  ANGIOGRAPHIC FINDINGS: 1. Left main coronary artery had no evidence of disease. 2. The left anterior descending was a large vessel that coursed to the     apex.  This vessel gave off a large-caliber diagonal branch.  Just     beyond the diagonal branch, there was a 99% subtotal occlusion.     Just beyond the subtotal occlusion, the LAD filled and gave off a     large septal perforator.  The rest of the vessel was not diseased. 3. The circumflex artery was composed mostly of a small-caliber obtuse     marginal branch.  This vessel was approximately a 1.5-mm vessel in     the distal segment where there was a 99% stenosis. 4. The right coronary  artery is a very large dominant artery with an     80% mid stenosis. 5. Left ventricular angiogram was performed in the RAO projection and     showed normal left ventricular systolic function with ejection     fraction of 60%.  IMPRESSION: 1. Triple-vessel coronary artery disease. 2. Successful percutaneous transluminal coronary angioplasty with     placement of a drug-eluting stent in the mid left anterior     descending. 3. Normal left ventricular systolic function.  RECOMMENDATIONS:  The patient should be continued on aspirin, Plavix, beta-blocker, and statin.  We will plan on staged PCI of the mid right coronary artery and possibly the small obtuse marginal branch on Monday, December 30, 2010.     Verne Carrow, MD     CM/MEDQ  D:  12/28/2010  T:  12/29/2010  Job:  [id number]  Electronically Signed by Verne Carrow MD on 12/31/2010 03:16:26 PM

## 2010-12-31 NOTE — H&P (Signed)
NAMEKRISTIA, [name]               ACCOUNT NO.:  [id number]  MEDICAL RECORD NO.:  [id number]  LOCATION:  2915                         FACILITY:  [facility]  PHYSICIAN:  Armanda Magic, M.D.     DATE OF BIRTH:  [birth year]  DATE OF ADMISSION:  12/28/2010 DATE OF DISCHARGE:                             HISTORY & PHYSICAL   REFERRING PHYSICIAN:  Dr. Rennis Chris at [facility].  PRIMARY CARDIOLOGIST:  She is unassigned and will go to Dr. Johney Frame, primary and lung doctor Dr. Delton Coombes.  CHIEF COMPLAINT:  Chest pain.  HISTORY OF PRESENT ILLNESS:  This is a 65 year old female with a history of emphysema, depression, migraine headache, and TIA who presented to [facility] ER with complaint of chest pain.  She states the chest pain started yesterday afternoon and she described it as a heaviness with radiation to both arms and generalized weakness.  Apparently, it was her birthday yesterday and she was over at her daughter's house and said she just did not feel good all afternoon.  She said the main thing that her arms felt extremely heavy.  Last evening, she had pressure in her chest and could not get comfortable during the night.  This morning, she had similar symptoms and decided to go to the ER.  She denied any shortness of breath, but was nauseated but did not vomit.  She also was diaphoretic.  Currently, she complains of 5/10 chest pressure despite IV heparin, IV nitroglycerin, and morphine.  PAST MEDICAL HISTORY: 1. Emphysema. 2. Depression. 3. Migraine headaches. 4. TIA. 5. Arthritis. 6. GERD.  ALLERGIES: 1. ULTRAM. 2. NAPROSYN.  FAMILY HISTORY:  Mother died of diabetes complications.  Father died of ruptured esophageal varices.  PAST SURGICAL HISTORY:  Appendectomy, tonsillectomy, thoracotomy for pneumothorax, foot surgery, nasal septal surgery, and shoulder surgeries.  SOCIAL HISTORY:  Remote history of tobacco use 1 pack per day for 10 years.  She denies any alcohol  use.  She is married.  REVIEW OF SYSTEMS:  Otherwise at stated in the HPI is negative.  MEDICATIONS: 1. Spiriva 18 mcg 1 capsule daily. 2. Topamax 100 mg daily. 3. Proventil p.r.n. 4. Zoloft 50 mg daily. 5. Voltaren 75 mg daily. 6. Vitamin D over-the-counter.  PHYSICAL EXAMINATION:  GENERAL:  This is a well-developed, well- nourished white female in no acute distress. VITAL SIGNS:  BP 107/71.  Heart rate 79. HEENT:  Benign. NECK:  Supple without lymphadenopathy.  Carotid upstrokes are +2 bilaterally.  No bruits. LUNGS:  Clear to auscultation throughout. HEART:  Regular rate and rhythm.  No murmurs, rubs, or gallops.  Normal S1 and S2. ABDOMEN:  Soft, nontender, and nondistended.  Normoactive bowel sounds. No hepatosplenomegaly. EXTREMITIES:  No cyanosis, erythema, or edema.  LABORATORY DATA:  Sodium 142, potassium 3.7, chloride 108, bicarb 22, BUN 18, and creatinine 0.7.  White cell count 7.1, hemoglobin 13, hematocrit 39.8, and platelet count 268.  Troponin 3.55.  EKG shows sinus rhythm with ST depression less than 0.5 mm inferolaterally, anterior infarct age undetermined.  ASSESSMENT: 1. Non-ST-elevation myocardial infarction with ongoing chest pain     despite IV heparin, nitroglycerin, aspirin, and morphine. 2. Chronic obstructive pulmonary disease. 3.  Gastroesophageal reflux disease. 4. Transient ischemic attack in the past.  PLAN:  Admit to CCU.  IV heparin and Integrilin drip.  Aspirin. Continue to titrate IV nitroglycerin drip to keep chest pain free as long as systolic blood pressure is greater than 100.  Check a fasting lipid panel.  If we cannot get the patient's pain improved after starting IV Integrilin and increasing her IV nitro, she may need to go the Cath Lab tonight.  Otherwise, we will plan for cath on Monday, December 30, 2010.     Armanda Magic, M.D.     TT/MEDQ  D:  12/28/2010  T:  12/28/2010  Job:  [id number]  cc:   Leslye Peer,  MD  Electronically Signed by Armanda Magic M.D. on 12/31/2010 08:39:40 AM

## 2011-01-08 NOTE — Cardiovascular Report (Signed)
  NAMEBRITNEE, [name]               ACCOUNT NO.:  [id number]  MEDICAL RECORD NO.:  [id number]  LOCATION:  6529                         FACILITY:  [facility]  PHYSICIAN:  Veverly Fells. Excell Seltzer, MD  DATE OF BIRTH:  November 13, 1945  DATE OF PROCEDURE: DATE OF DISCHARGE:                           CARDIAC CATHETERIZATION   PROCEDURE:  Stenting of the right coronary artery.  PROCEDURAL INDICATIONS:  [name] is a 65 year old woman who presented with non-ST-elevation infarction.  She had cardiac cath demonstrating critical LAD stenosis and she was treated with PCI.  At the time of her diagnostic procedure, she was noted to have severe stenosis of the mid- right coronary artery.  She was referred for staged PCI.  There was an 80% focal stenosis of the mid RCA.  Risks and indications of the procedure were reviewed with the patient. Informed consent was obtained.  The right wrist was prepped, draped, and anesthetized with 1% lidocaine using modified Seldinger technique.  A 6- French sheath was placed in the right radial artery.  Angiomax was used for anticoagulation.  Verapamil was administered through the sheath.  A JR-4 guide catheter was inserted.  Once a therapeutic ACT was achieved, a BMW guidewire was advanced.  The wire markers were used to measure the length of the lesion.  A 3.0 x 12-mm PROMUS Element stent was carefully positioned and deployed at 14 atmospheres.  The stent appeared well expanded.  It did appear somewhat undersized for the vessel.  A 3.5 x 8- mm [place] track balloon was advanced and multiple inflations were done to 14 atmospheres throughout the stented segment so that there was 0% residual stenosis and TIMI 3 flow.  There was an excellent angiographic result. The patient tolerated the procedure well.  There were no immediate complications.  FINAL CONCLUSIONS:  Successful staged percutaneous intervention of severe stenosis in the mid-right coronary artery using a single  drug- eluting stent.  RECOMMENDATIONS:  The patient will continue on dual-antiplatelet therapy for 12 months.     Veverly Fells. Excell Seltzer, MD     MDC/MEDQ  D:  12/30/2010  T:  12/30/2010  Job:  [id number]  cc:   Leslye Peer, MD  Electronically Signed by Tonny Bollman MD on 01/08/2011 11:36:37 PM

## 2011-01-10 ENCOUNTER — Telehealth: Payer: Self-pay | Admitting: Cardiovascular Disease

## 2011-01-10 NOTE — Telephone Encounter (Signed)
Pt has had 2 caths and has a hard knot in her groin.  No pain,redness, just concerning.  Please call her back with instructions as to what to do.

## 2011-01-10 NOTE — Telephone Encounter (Signed)
Reporting a knot the size of a thumbnail in groin post cath.  No redness, no elevated temp, no pus, no pain.  She just noticed the knot and will watch to make sure it doesn't enlarge or develop any of the above.

## 2011-01-16 NOTE — Discharge Summary (Signed)
[name], [name] NO.:  [id number]  MEDICAL RECORD NO.:  [id number]  LOCATION:  6529                         FACILITY:  [facility]  PHYSICIAN:  Verne Carrow, MDDATE OF BIRTH:  [birth year]  DATE OF ADMISSION:  12/28/2010 DATE OF DISCHARGE:  12/31/2010                              DISCHARGE SUMMARY   PRIMARY CARDIOLOGIST:  Verne Carrow, MD  PULMONOLOGIST:  Leslye Peer, MD  DISCHARGE DIAGNOSIS:  Non-ST-segment elevation myocardial infarction.  SECONDARY DIAGNOSES: 1. Coronary artery disease status post percutaneous coronary     intervention and drug-eluting stenting of the left anterior     descending and mid right coronary artery this admission. 2. Hypotension requiring discontinuation of beta blocker. 3. Hyperlipidemia. 4. Transient ischemic attack. 5. Migraine headaches. 6. Depression. 7. Emphysema. 8. Arthritis. 9. Gastroesophageal reflux disease.  ALLERGIES:  ULTRAM and NAPROSYN.  PROCEDURES: 1. Left heart diagnostic cardiac catheterization performed December 28, 2010, revealing 99% stenosis in the mid left anterior descending as     well as a 99% stenosis of the first obtuse marginal (1.5 mm) and     80% stenosis in the mid right coronary artery.  Ejection fraction     was 60%.  The left anterior descending was stented with placement     of a 2.5 x 20-mm PROMUS Element Plus drug-eluting stent. 2. Staged percutaneous intervention and stenting of the mid-right     coronary artery with placement of a 3.0 x 12-mm PROMUS Element Plus     drug-eluting stent.  HISTORY OF PRESENT ILLNESS:  A 65 year old female without prior cardiac history who was in her usual state of health until the day prior to admission when she began to experience bilateral arm heaviness, followed by chest discomfort and restlessness.  On the morning of admission, she presented to the [facility] where she was noted to have 0.5- mm inferolateral  ST-segment depression with prior anterior infarct and a troponin of 3.55.  The patient was transferred to [facility] for further management of non-ST-segment elevation myocardial infarction.  HOSPITAL COURSE:  The patient eventually peaked her CK at 479, MB 38.2, and her troponin I at 12.17.  She was placed on aspirin, beta blocker and statin therapy and underwent diagnostic catheterization on December 28, 2010, revealing a subtotal occlusion of the LAD as well as a 99% stenosis in a small-caliber first obtuse marginal, and 80% stenosis in the mid RCA.  EF was 60%.  The LAD was felt to be the culprit vessel and this was successfully stented with a 2.5 x 20-mm PROMUS Element Plus drug-eluting stent.  The patient tolerated the procedure well and postprocedure had no recurrent chest discomfort.  Given residual disease, decision was made to pursue staged procedure and intervention to the right coronary artery and this was performed on December 30, 2010, with successful placement of a 3.0 x 12-mm PROMUS Element Plus drug-eluting stent.  The patient tolerated this procedure well, however, postprocedure had borderline hypotension requiring discontinuation of beta-blocker therapy.  She has otherwise been maintained on aspirin and statin.  She had been seen by Cardiac Rehab and has been ambulating without difficulty.  She will be discharged home today in good condition.  DISCHARGE LABORATORY DATA:  Hemoglobin 9.6, hematocrit 30.2, WBC 5.8, platelets 201.  Sodium 144, potassium 4.0, chloride 114, CO2 23, BUN 10, creatinine 0.65, glucose 91.  Total bilirubin 0.3, alkaline phosphatase 59, AST 54, ALT 14, total protein 6.4, albumin 3.4, calcium 8.9, hemoglobin A1c 6.1, CK 394, MB 16.4, troponin-I 8.07.  MRSA screen was negative.  DISPOSITION:  The patient will be discharged home today in good condition.  FOLLOWUP PLANS AND APPOINTMENTS:  The patient will follow up with Dr. Clifton James on January 23, 2011, at 9:45 a.m.  DISCHARGE MEDICATIONS: 1. Aspirin 81 mg daily. 2. Plavix 75 mg daily. 3. Lipitor 80 mg at bedtime. 4. Nitroglycerin 0.4 mg sublingual p.r.n. chest pain. 5. Calcium carbonate over-the-counter 1 tablet daily. 6. Fish oil 1000 mg b.i.d. 7. Multivitamin 1 tablet daily. 8. Proventil inhaler 90 mcg 1 puff q. 6 h. P.r.n. 9. Sertraline 50 mg daily. 10.Spiriva 18 mcg daily. 11.Topamax 100 mg at bedtime. 12.Vitamin D 2000 units daily  OUTSTANDING LAB STUDIES:  Followup lipids and LFTs in 6-8 weeks, given new statin therapy.  DURATION OF DISCHARGE ENCOUNTER:  45 minutes including physician time.     Nicolasa Ducking, ANP   ______________________________ Verne Carrow, MD    CB/MEDQ  D:  12/31/2010  T:  12/31/2010  Job:  [id number]  Electronically Signed by Nicolasa Ducking ANP on 01/16/2011 03:16:32 PM Electronically Signed by Verne Carrow MD on 01/16/2011 09:36:13 PM

## 2011-01-23 ENCOUNTER — Encounter: Payer: Self-pay | Admitting: Cardiovascular Disease

## 2011-01-23 ENCOUNTER — Ambulatory Visit (INDEPENDENT_AMBULATORY_CARE_PROVIDER_SITE_OTHER): Payer: Medicare Other | Admitting: Cardiovascular Disease

## 2011-01-23 VITALS — BP 110/64 | HR 68 | Ht 62.0 in | Wt 116.0 lb

## 2011-01-23 DIAGNOSIS — I2510 Atherosclerotic heart disease of native coronary artery without angina pectoris: Secondary | ICD-10-CM | POA: Insufficient documentation

## 2011-01-23 LAB — LIPID PANEL
HDL: 52.6 mg/dL (ref >39.00)
Triglycerides: 53 mg/dL (ref 0.0–149.0)

## 2011-01-23 LAB — BASIC METABOLIC PANEL
Calcium: 9 mg/dL (ref 8.4–10.5)
Creatinine, Ser: 0.6 mg/dL (ref 0.4–1.2)
GFR: 102.65 mL/min (ref >60.00)
Glucose, Bld: 81 mg/dL (ref 70–99)
Sodium: 141 mEq/L (ref 135–145)

## 2011-01-23 LAB — HEPATIC FUNCTION PANEL
ALT: 16 U/L (ref 0–35)
AST: 21 U/L (ref 0–37)
Alkaline Phosphatase: 68 U/L (ref 39–117)
Total Bilirubin: 0.3 mg/dL (ref 0.3–1.2)

## 2011-01-23 MED ORDER — CLOPIDOGREL BISULFATE 75 MG PO TABS: 75.0000 mg | ORAL_TABLET | Freq: Every day | ORAL | Status: DC

## 2011-01-23 MED ORDER — ROSUVASTATIN CALCIUM 10 MG PO TABS: 10.0000 mg | ORAL_TABLET | Freq: Every day | ORAL | Status: DC

## 2011-01-23 NOTE — Assessment & Plan Note (Signed)
Stable. Continue ASA and pLavix for at least one year. She did not tolerate beta blockers because of hypotension. Will change Lipitor to Crestor 10 mg QHS. Check BMET, fasting lipids and LFTs today.

## 2011-01-23 NOTE — Progress Notes (Signed)
History of Present Illness:65 yo female with h/o CAD, COPD, GERD, depression, OA  admitted to [facility] 12/28/10 with a NSTEMI. Cardiac cath with severe disease LAD. A drug eluting stent was placed in the mid LAD on 12/28/10. Staged intervention of mid RCA was performed 12/30/10 with a drug eluting stent placed in the mid RCA by Dr. Excell Seltzer. She is her today for hospital follow up. She tells me that she has been feeling well. No chest pain. She has had SOB at times. She is planning to start cardiac rehab next week. She has been tolerating all medications.   Past Medical History  Diagnosis Date  . Emphysema   . Depression   . Migraine   . TIA (transient ischemic attack) 1995  . Arthritis   . Coronary artery disease August 2012    Drug eluting stent mid LAD, drug eluting stent mid RCA    Past Surgical History  Procedure Date  . Appendectomy   . Tonsillectomy   . Thoracotomy   . Foot surgery   . Nasal septum surgery   . Shoulder surgery   . Hand surgery     Current Outpatient Prescriptions  Medication Sig Dispense Refill  . albuterol (PROVENTIL HFA) 108 (90 BASE) MCG/ACT inhaler Inhale 2 puffs into the lungs every 6 (six) hours as needed.        Marland Kitchen aspirin 81 MG chewable tablet Chew 81 mg by mouth daily.        Marland Kitchen atorvastatin (LIPITOR) 80 MG tablet Take 1 tablet by mouth Daily.      . Cholecalciferol (VITAMIN D) 2000 UNITS CAPS Take by mouth daily.        . clopidogrel (PLAVIX) 75 MG tablet Take 1 tablet by mouth Daily.      Marland Kitchen ESTRACE VAGINAL 0.1 MG/GM vaginal cream as directed.      . Omega-3 Fatty Acids (FISH OIL) 1000 MG CAPS Take 2 capsules by mouth once daily       . sertraline (ZOLOFT) 50 MG tablet Take by mouth daily.        Marland Kitchen tiotropium (SPIRIVA) 18 MCG inhalation capsule Place 1 capsule (18 mcg total) into inhaler and inhale daily.  90 capsule  3  . topiramate (TOPAMAX) 100 MG tablet Take by mouth daily.          Allergies  Allergen Reactions  . Naproxen Hives  .  Tramadol Hcl Nausea And Vomiting    History   Social History  . Marital Status: Married    Spouse Name: N/A    Number of Children: N/A  . Years of Education: N/A   Occupational History  . Not on file.   Social History Main Topics  . Smoking status: Former Smoker -- 1.0 packs/day for 10 years    Types: Cigarettes    Quit date: 05/06/1971  . Smokeless tobacco: Not on file  . Alcohol Use: No  . Drug Use: No  . Sexually Active: Not on file   Other Topics Concern  . Not on file   Social History Narrative  . No narrative on file    Family History  Problem Relation Age of Onset  . Heart disease Paternal Grandmother   . Heart disease Paternal Grandfather   . Rheum arthritis Maternal Grandmother   . Rheum arthritis Maternal Grandfather   . Lung cancer Maternal Uncle   . Diabetes Mother   . Diabetes Maternal Aunt   . Diabetes Maternal Grandmother  Review of Systems:  As stated in the HPI and otherwise negative.   BP 110/64  Pulse 68  Ht 5\' 2"  (1.575 m)  Wt 116 lb (52.617 kg)  BMI 21.22 kg/m2  Physical Examination: General: Well developed, well nourished, NAD HEENT: OP clear, mucus membranes moist SKIN: warm, dry. No rashes. Neuro: No focal deficits Musculoskeletal: Muscle strength 5/5 all ext Psychiatric: Mood and affect normal Neck: No JVD, no carotid bruits, no thyromegaly, no lymphadenopathy. Lungs:Clear bilaterally, no wheezes, rhonci, crackles Cardiovascular: Regular rate and rhythm. No murmurs, gallops or rubs. Abdomen:Soft. Bowel sounds present. Non-tender.  Extremities: No lower extremity edema. Pulses are 2 + in the bilateral DP/PT.  EKG:NSR, rate 66 bpm.   Cardiac Cath 12/28/10:  1. Left main coronary artery had no evidence of disease.   2. The left anterior descending was a large vessel that coursed to the       apex.  This vessel gave off a large-caliber diagonal branch.  Just       beyond the diagonal branch, there was a 99% subtotal  occlusion.       Just beyond the subtotal occlusion, the LAD filled and gave off a       large septal perforator.  The rest of the vessel was not diseased.   3. The circumflex artery was composed mostly of a small-caliber obtuse       marginal branch.  This vessel was approximately a 1.5-mm vessel in       the distal segment where there was a 99% stenosis.   4. The right coronary artery is a very large dominant artery with an       80% mid stenosis.   5. Left ventricular angiogram was performed in the RAO projection and       showed normal left ventricular systolic function with ejection       fraction of 60%.

## 2011-01-23 NOTE — Patient Instructions (Signed)
Your physician wants you to follow-up in: 6 months. You will receive a reminder letter in the mail two months in advance. If you don't receive a letter, please call our office to schedule the follow-up appointment.  Your physician has recommended you make the following change in your medication: Stop Lipitor. Start Crestor 10 mg by mouth daily.

## 2011-02-03 ENCOUNTER — Encounter (HOSPITAL_COMMUNITY)
Admission: RE | Admit: 2011-02-03 | Discharge: 2011-02-03 | Disposition: A | Discharge status: Home / Self Care | Payer: Medicare Other | Source: Ambulatory Visit | Attending: Cardiovascular Disease | Admitting: Cardiovascular Disease

## 2011-02-03 DIAGNOSIS — Z8673 Personal history of transient ischemic attack (TIA), and cerebral infarction without residual deficits: Secondary | ICD-10-CM | POA: Insufficient documentation

## 2011-02-03 DIAGNOSIS — Z5189 Encounter for other specified aftercare: Secondary | ICD-10-CM | POA: Insufficient documentation

## 2011-02-03 DIAGNOSIS — J449 Chronic obstructive pulmonary disease, unspecified: Secondary | ICD-10-CM | POA: Insufficient documentation

## 2011-02-03 DIAGNOSIS — F3289 Other specified depressive episodes: Secondary | ICD-10-CM | POA: Insufficient documentation

## 2011-02-03 DIAGNOSIS — E785 Hyperlipidemia, unspecified: Secondary | ICD-10-CM | POA: Insufficient documentation

## 2011-02-03 DIAGNOSIS — F329 Major depressive disorder, single episode, unspecified: Secondary | ICD-10-CM | POA: Insufficient documentation

## 2011-02-03 DIAGNOSIS — I214 Non-ST elevation (NSTEMI) myocardial infarction: Secondary | ICD-10-CM | POA: Insufficient documentation

## 2011-02-03 DIAGNOSIS — Z9861 Coronary angioplasty status: Secondary | ICD-10-CM | POA: Insufficient documentation

## 2011-02-03 DIAGNOSIS — I2510 Atherosclerotic heart disease of native coronary artery without angina pectoris: Secondary | ICD-10-CM | POA: Insufficient documentation

## 2011-02-03 DIAGNOSIS — J4489 Other specified chronic obstructive pulmonary disease: Secondary | ICD-10-CM | POA: Insufficient documentation

## 2011-02-03 DIAGNOSIS — K219 Gastro-esophageal reflux disease without esophagitis: Secondary | ICD-10-CM | POA: Insufficient documentation

## 2011-02-05 ENCOUNTER — Encounter (HOSPITAL_COMMUNITY): Payer: Medicare Other

## 2011-02-07 ENCOUNTER — Encounter (HOSPITAL_COMMUNITY): Payer: Medicare Other

## 2011-02-10 ENCOUNTER — Encounter (HOSPITAL_COMMUNITY): Payer: Medicare Other

## 2011-02-12 ENCOUNTER — Encounter (HOSPITAL_COMMUNITY): Payer: Medicare Other

## 2011-02-12 ENCOUNTER — Telehealth: Payer: Self-pay | Admitting: Cardiovascular Disease

## 2011-02-12 DIAGNOSIS — I2510 Atherosclerotic heart disease of native coronary artery without angina pectoris: Secondary | ICD-10-CM

## 2011-02-12 DIAGNOSIS — E7800 Pure hypercholesterolemia, unspecified: Secondary | ICD-10-CM

## 2011-02-12 MED ORDER — ROSUVASTATIN CALCIUM 10 MG PO TABS: 10.0000 mg | ORAL_TABLET | Freq: Every day | ORAL | Status: DC

## 2011-02-12 NOTE — Telephone Encounter (Signed)
Prescription sent and pt notified

## 2011-02-12 NOTE — Telephone Encounter (Signed)
Pt called and is ready for crestor to be called into [facility].  Please call her at [phone] when this has been done.

## 2011-02-14 ENCOUNTER — Encounter (HOSPITAL_COMMUNITY): Payer: Medicare Other

## 2011-02-17 ENCOUNTER — Encounter (HOSPITAL_COMMUNITY): Payer: Medicare Other

## 2011-02-19 ENCOUNTER — Telehealth: Payer: Self-pay | Admitting: Cardiovascular Disease

## 2011-02-19 ENCOUNTER — Encounter (HOSPITAL_COMMUNITY): Payer: Medicare Other

## 2011-02-19 NOTE — Telephone Encounter (Signed)
Pt had PVC's and it is something new and she is a-symptomatic [name] will send strips but she wanted someone to look at it and call her back but she will send pt home for now.

## 2011-02-21 ENCOUNTER — Encounter (HOSPITAL_COMMUNITY): Payer: Medicare Other

## 2011-02-24 ENCOUNTER — Encounter (HOSPITAL_COMMUNITY): Payer: Medicare Other

## 2011-02-26 ENCOUNTER — Encounter (HOSPITAL_COMMUNITY): Payer: Medicare Other

## 2011-02-28 ENCOUNTER — Encounter (HOSPITAL_COMMUNITY): Payer: Medicare Other

## 2011-03-03 ENCOUNTER — Encounter (HOSPITAL_COMMUNITY): Payer: Medicare Other

## 2011-03-05 ENCOUNTER — Encounter (HOSPITAL_COMMUNITY): Payer: Medicare Other

## 2011-03-07 ENCOUNTER — Encounter (HOSPITAL_COMMUNITY): Payer: Medicare Other

## 2011-03-07 DIAGNOSIS — F3289 Other specified depressive episodes: Secondary | ICD-10-CM | POA: Insufficient documentation

## 2011-03-07 DIAGNOSIS — K219 Gastro-esophageal reflux disease without esophagitis: Secondary | ICD-10-CM | POA: Insufficient documentation

## 2011-03-07 DIAGNOSIS — Z5189 Encounter for other specified aftercare: Secondary | ICD-10-CM | POA: Insufficient documentation

## 2011-03-07 DIAGNOSIS — Z8673 Personal history of transient ischemic attack (TIA), and cerebral infarction without residual deficits: Secondary | ICD-10-CM | POA: Insufficient documentation

## 2011-03-07 DIAGNOSIS — J449 Chronic obstructive pulmonary disease, unspecified: Secondary | ICD-10-CM | POA: Insufficient documentation

## 2011-03-07 DIAGNOSIS — J4489 Other specified chronic obstructive pulmonary disease: Secondary | ICD-10-CM | POA: Insufficient documentation

## 2011-03-07 DIAGNOSIS — E785 Hyperlipidemia, unspecified: Secondary | ICD-10-CM | POA: Insufficient documentation

## 2011-03-07 DIAGNOSIS — I214 Non-ST elevation (NSTEMI) myocardial infarction: Secondary | ICD-10-CM | POA: Insufficient documentation

## 2011-03-07 DIAGNOSIS — Z9861 Coronary angioplasty status: Secondary | ICD-10-CM | POA: Insufficient documentation

## 2011-03-07 DIAGNOSIS — I2510 Atherosclerotic heart disease of native coronary artery without angina pectoris: Secondary | ICD-10-CM | POA: Insufficient documentation

## 2011-03-07 DIAGNOSIS — F329 Major depressive disorder, single episode, unspecified: Secondary | ICD-10-CM | POA: Insufficient documentation

## 2011-03-10 ENCOUNTER — Encounter (HOSPITAL_COMMUNITY): Payer: Medicare Other

## 2011-03-12 ENCOUNTER — Encounter (HOSPITAL_COMMUNITY): Payer: Medicare Other

## 2011-03-14 ENCOUNTER — Encounter (HOSPITAL_COMMUNITY): Payer: Medicare Other

## 2011-03-17 ENCOUNTER — Encounter (HOSPITAL_COMMUNITY): Payer: Medicare Other

## 2011-03-19 ENCOUNTER — Encounter (HOSPITAL_COMMUNITY): Payer: Medicare Other

## 2011-03-21 ENCOUNTER — Encounter (HOSPITAL_COMMUNITY)
Admission: RE | Admit: 2011-03-21 | Discharge: 2011-03-21 | Disposition: A | Discharge status: Home / Self Care | Payer: Medicare Other | Source: Ambulatory Visit | Attending: Cardiovascular Disease | Admitting: Cardiovascular Disease

## 2011-03-24 ENCOUNTER — Encounter (HOSPITAL_COMMUNITY)
Admission: RE | Admit: 2011-03-24 | Discharge: 2011-03-24 | Disposition: A | Discharge status: Home / Self Care | Payer: Medicare Other | Source: Ambulatory Visit | Attending: Cardiovascular Disease | Admitting: Cardiovascular Disease

## 2011-03-26 ENCOUNTER — Encounter (HOSPITAL_COMMUNITY)
Admission: RE | Admit: 2011-03-26 | Discharge: 2011-03-26 | Disposition: A | Discharge status: Home / Self Care | Payer: Medicare Other | Source: Ambulatory Visit | Attending: Cardiovascular Disease | Admitting: Cardiovascular Disease

## 2011-03-28 ENCOUNTER — Encounter (HOSPITAL_COMMUNITY): Payer: Medicare Other

## 2011-03-31 ENCOUNTER — Encounter (HOSPITAL_COMMUNITY)
Admission: RE | Admit: 2011-03-31 | Discharge: 2011-03-31 | Disposition: A | Discharge status: Home / Self Care | Payer: Medicare Other | Source: Ambulatory Visit | Attending: Cardiovascular Disease | Admitting: Cardiovascular Disease

## 2011-04-02 ENCOUNTER — Encounter (HOSPITAL_COMMUNITY)
Admission: RE | Admit: 2011-04-02 | Discharge: 2011-04-02 | Disposition: A | Discharge status: Home / Self Care | Payer: Medicare Other | Source: Ambulatory Visit | Attending: Cardiovascular Disease | Admitting: Cardiovascular Disease

## 2011-04-04 ENCOUNTER — Encounter (HOSPITAL_COMMUNITY)
Admission: RE | Admit: 2011-04-04 | Discharge: 2011-04-04 | Disposition: A | Discharge status: Home / Self Care | Payer: Medicare Other | Source: Ambulatory Visit | Attending: Cardiovascular Disease | Admitting: Cardiovascular Disease

## 2011-04-07 ENCOUNTER — Encounter (HOSPITAL_COMMUNITY)
Admission: RE | Admit: 2011-04-07 | Discharge: 2011-04-07 | Disposition: A | Discharge status: Home / Self Care | Payer: Medicare Other | Source: Ambulatory Visit | Attending: Cardiovascular Disease | Admitting: Cardiovascular Disease

## 2011-04-07 DIAGNOSIS — Z9861 Coronary angioplasty status: Secondary | ICD-10-CM | POA: Insufficient documentation

## 2011-04-07 DIAGNOSIS — I214 Non-ST elevation (NSTEMI) myocardial infarction: Secondary | ICD-10-CM | POA: Insufficient documentation

## 2011-04-07 DIAGNOSIS — Z8673 Personal history of transient ischemic attack (TIA), and cerebral infarction without residual deficits: Secondary | ICD-10-CM | POA: Insufficient documentation

## 2011-04-07 DIAGNOSIS — J4489 Other specified chronic obstructive pulmonary disease: Secondary | ICD-10-CM | POA: Insufficient documentation

## 2011-04-07 DIAGNOSIS — Z5189 Encounter for other specified aftercare: Secondary | ICD-10-CM | POA: Insufficient documentation

## 2011-04-07 DIAGNOSIS — F3289 Other specified depressive episodes: Secondary | ICD-10-CM | POA: Insufficient documentation

## 2011-04-07 DIAGNOSIS — J449 Chronic obstructive pulmonary disease, unspecified: Secondary | ICD-10-CM | POA: Insufficient documentation

## 2011-04-07 DIAGNOSIS — I2510 Atherosclerotic heart disease of native coronary artery without angina pectoris: Secondary | ICD-10-CM | POA: Insufficient documentation

## 2011-04-07 DIAGNOSIS — E785 Hyperlipidemia, unspecified: Secondary | ICD-10-CM | POA: Insufficient documentation

## 2011-04-07 DIAGNOSIS — K219 Gastro-esophageal reflux disease without esophagitis: Secondary | ICD-10-CM | POA: Insufficient documentation

## 2011-04-07 DIAGNOSIS — F329 Major depressive disorder, single episode, unspecified: Secondary | ICD-10-CM | POA: Insufficient documentation

## 2011-04-08 ENCOUNTER — Ambulatory Visit (INDEPENDENT_AMBULATORY_CARE_PROVIDER_SITE_OTHER): Payer: Medicare Other | Admitting: Emergency Medicine

## 2011-04-08 ENCOUNTER — Encounter: Payer: Self-pay | Admitting: Emergency Medicine

## 2011-04-08 DIAGNOSIS — J4489 Other specified chronic obstructive pulmonary disease: Secondary | ICD-10-CM

## 2011-04-08 DIAGNOSIS — R059 Cough, unspecified: Secondary | ICD-10-CM

## 2011-04-08 DIAGNOSIS — J449 Chronic obstructive pulmonary disease, unspecified: Secondary | ICD-10-CM

## 2011-04-08 DIAGNOSIS — R05 Cough: Secondary | ICD-10-CM

## 2011-04-08 NOTE — Patient Instructions (Signed)
Continue your Spiriva daily Use albuterol inhaler 2 puffs if needed for shortness of breath Follow up with Dr Delton Coombes in 6 months or sooner if you have any problems.

## 2011-04-08 NOTE — Progress Notes (Signed)
  Subjective:    Patient ID: [name], female    DOB: [birth year], 65 y.o.   MRN: [id number]  HPI Comments: [name] is a 65 year old woman with mild COPD and hyperinflation per her pulmonary function testing.  Also with chronic cough, has been treated for GERD and with loratadine/nasal steroid. The GERD therapy did not seem to change the cough, but she may have benefitted from the allergy rx.     ROS: Still with daily cough - paroxysms. Non-productive. Has been rx for allergies before - immunotherapy.  No GERD sx.     ROV 04/10/09 -- has been doing well for the last yr on Spiriva until about 3 -4 weeks ago. At that time she developed R sided CP that feels "deep", worse with inspiration. Some associated SOB, also with more cough. She has been having persistant dry cough, was better for a while without intervention (no longer on an allergy regimen).     ROV 04/25/09 -- returns for f/u cough. CT scan of the chest reassuring, no evidence PE. Still with dry cough. Has been treated for GERD and allergies in the past without great change in the cough.     ROV 04/15/10 -- F/u for her chronic cough, mild COPD. She still has daily cough, cycles of 3-7 days o fterrible paroxysmal cough, then back to her usual cough pattern. Has been treated in past for both GERD and allergies. Taking Spiriva in the am, never needs ProAir, breathing is stable. Not currently on loratadine/nasal steroid. No PND, no GERD signs.  >>referred to ENT w/ nml laryngoscope, tx w/ PPI and neurontin-unable to tolerate neurontin.   07/30/2010 Acute OV- Presents for an acute office visit. Complains of 4 weeks of right sided chest pain that occurs when she takes deep breath or coughs. Mild in nature but has not resolved. NO meds used. She has hx of chronic cough - it is at baseline. Cough is dry w/ no discolored mucus. NO exertional chest pain, syncope, palpitations, recent travel or calf swelling/pain. Nontender to touch, and does not  reproduce with movement except deep inspiration. Last mammogram 04/2010 nml per pt.   ROV 04/08/11 -- hx mild COPD, chronic cough with GERD and PND. Was seen by Dr Jenne Pane for ENT exam 05/2010 - normal pharynx. Since last visit had acute MI, s/p PTCI, following w Dr Dicie Beam. Attending cardiac rehab. Breathing fairly well. Taking Spiriva. Cough is stable. Very rare rescue inhaler use. She had flu shot and pneumovax this year. No exacerbations since last visit.     Objective:   Physical Exam Gen: Pleasant, well-nourished, in no distress,  normal affect  ENT: No lesions,  mouth clear,  oropharynx clear, no postnasal drip  Neck: No JVD, no TMG, no carotid bruits  Lungs: No use of accessory muscles, no dullness to percussion, clear without rales or rhonchi Chest wall nontender - no eccymosis noted.  No axillary adenopathy.   Cardiovascular: RRR, heart sounds normal, no murmur or gallops, no peripheral edema  Abdomen: soft and NT, no HSM,  BS normal  Musculoskeletal: No deformities, no cyanosis or clubbing Cervical rom nm w/ no radicular symptoms   Neuro: alert, non focal  Skin: Warm, no lesions or rashes     Assessment & Plan:  COPD - continue Spiriva + prn SABA - flu and pneumovax UTD - f/u in 6 months or prn    COUGH, CHRONIC Stable, still present - no intervention at this time.

## 2011-04-08 NOTE — Assessment & Plan Note (Signed)
-   continue Spiriva + prn SABA - flu and pneumovax UTD - f/u in 6 months or prn

## 2011-04-08 NOTE — Assessment & Plan Note (Signed)
Stable, still present - no intervention at this time.

## 2011-04-09 ENCOUNTER — Encounter (HOSPITAL_COMMUNITY)
Admission: RE | Admit: 2011-04-09 | Discharge: 2011-04-09 | Disposition: A | Discharge status: Home / Self Care | Payer: Medicare Other | Source: Ambulatory Visit | Attending: Cardiovascular Disease | Admitting: Cardiovascular Disease

## 2011-04-11 ENCOUNTER — Encounter (HOSPITAL_COMMUNITY)
Admission: RE | Admit: 2011-04-11 | Discharge: 2011-04-11 | Disposition: A | Discharge status: Home / Self Care | Payer: Medicare Other | Source: Ambulatory Visit | Attending: Cardiovascular Disease | Admitting: Cardiovascular Disease

## 2011-04-14 ENCOUNTER — Encounter (HOSPITAL_COMMUNITY)
Admission: RE | Admit: 2011-04-14 | Discharge: 2011-04-14 | Disposition: A | Discharge status: Home / Self Care | Payer: Medicare Other | Source: Ambulatory Visit | Attending: Cardiovascular Disease | Admitting: Cardiovascular Disease

## 2011-04-16 ENCOUNTER — Encounter (HOSPITAL_COMMUNITY)
Admission: RE | Admit: 2011-04-16 | Discharge: 2011-04-16 | Disposition: A | Discharge status: Home / Self Care | Payer: Medicare Other | Source: Ambulatory Visit | Attending: Cardiovascular Disease | Admitting: Cardiovascular Disease

## 2011-04-18 ENCOUNTER — Encounter (HOSPITAL_COMMUNITY)
Admission: RE | Admit: 2011-04-18 | Discharge: 2011-04-18 | Disposition: A | Discharge status: Home / Self Care | Payer: Medicare Other | Source: Ambulatory Visit | Attending: Cardiovascular Disease | Admitting: Cardiovascular Disease

## 2011-04-21 ENCOUNTER — Encounter (HOSPITAL_COMMUNITY)
Admission: RE | Admit: 2011-04-21 | Discharge: 2011-04-21 | Disposition: A | Discharge status: Home / Self Care | Payer: Medicare Other | Source: Ambulatory Visit | Attending: Cardiovascular Disease | Admitting: Cardiovascular Disease

## 2011-04-23 ENCOUNTER — Encounter (HOSPITAL_COMMUNITY)
Admission: RE | Admit: 2011-04-23 | Discharge: 2011-04-23 | Disposition: A | Discharge status: Home / Self Care | Payer: Medicare Other | Source: Ambulatory Visit | Attending: Cardiovascular Disease | Admitting: Cardiovascular Disease

## 2011-04-25 ENCOUNTER — Encounter (HOSPITAL_COMMUNITY)
Admission: RE | Admit: 2011-04-25 | Discharge: 2011-04-25 | Disposition: A | Discharge status: Home / Self Care | Payer: Medicare Other | Source: Ambulatory Visit | Attending: Cardiovascular Disease | Admitting: Cardiovascular Disease

## 2011-04-28 ENCOUNTER — Encounter (HOSPITAL_COMMUNITY): Payer: Medicare Other

## 2011-04-30 ENCOUNTER — Encounter (HOSPITAL_COMMUNITY): Payer: Medicare Other

## 2011-05-02 ENCOUNTER — Encounter (HOSPITAL_COMMUNITY): Payer: Medicare Other

## 2011-05-02 ENCOUNTER — Telehealth: Payer: Self-pay | Admitting: Cardiovascular Disease

## 2011-05-02 NOTE — Telephone Encounter (Signed)
PT HAS DIAGNOSED HERSELF WITH THE FLU AND WANTS TO KNOW IF CAN TAKE TAMIFLU AND IF SO WANTS A RX CALLED TO [facility] IN [facility]

## 2011-05-02 NOTE — Telephone Encounter (Signed)
I spoke with the pt and made her aware that she needs to contact her PCP about flu-like symptoms and any treatment. Pt agreed with plan.

## 2011-05-05 ENCOUNTER — Encounter (HOSPITAL_COMMUNITY): Payer: Medicare Other

## 2011-05-07 ENCOUNTER — Encounter (HOSPITAL_COMMUNITY): Payer: Medicare Other | Attending: Cardiovascular Disease

## 2011-05-07 DIAGNOSIS — J4489 Other specified chronic obstructive pulmonary disease: Secondary | ICD-10-CM | POA: Insufficient documentation

## 2011-05-07 DIAGNOSIS — Z8673 Personal history of transient ischemic attack (TIA), and cerebral infarction without residual deficits: Secondary | ICD-10-CM | POA: Insufficient documentation

## 2011-05-07 DIAGNOSIS — E785 Hyperlipidemia, unspecified: Secondary | ICD-10-CM | POA: Insufficient documentation

## 2011-05-07 DIAGNOSIS — K219 Gastro-esophageal reflux disease without esophagitis: Secondary | ICD-10-CM | POA: Insufficient documentation

## 2011-05-07 DIAGNOSIS — Z5189 Encounter for other specified aftercare: Secondary | ICD-10-CM | POA: Insufficient documentation

## 2011-05-07 DIAGNOSIS — I214 Non-ST elevation (NSTEMI) myocardial infarction: Secondary | ICD-10-CM | POA: Insufficient documentation

## 2011-05-07 DIAGNOSIS — J449 Chronic obstructive pulmonary disease, unspecified: Secondary | ICD-10-CM | POA: Insufficient documentation

## 2011-05-07 DIAGNOSIS — Z9861 Coronary angioplasty status: Secondary | ICD-10-CM | POA: Insufficient documentation

## 2011-05-07 DIAGNOSIS — F3289 Other specified depressive episodes: Secondary | ICD-10-CM | POA: Insufficient documentation

## 2011-05-07 DIAGNOSIS — I2510 Atherosclerotic heart disease of native coronary artery without angina pectoris: Secondary | ICD-10-CM | POA: Insufficient documentation

## 2011-05-07 DIAGNOSIS — F329 Major depressive disorder, single episode, unspecified: Secondary | ICD-10-CM | POA: Insufficient documentation

## 2011-05-08 NOTE — Progress Notes (Addendum)
Cardiac Rehabilitation Program Progress Report   Orientation:  01/30/2011 Graduate Date:  04/25/2011 Discharge Date:   # of sessions completed: 35/36  Cardiologist: Clifton James Family MD:  Babette Relic Time:  945  A.  Exercise Program:  Tolerates exercise @ 4.2 METS for 30 minutes, Bike Test Results:  Pre: 0.73 miles and Post: 0.88 miles, Improved functional capacity  20.6 %, Improved  muscular strength  11.1 %, No Change  flexibility 0 (15 in) %, No Change education score 100 % and Discharged to home exercise program.  Anticipated compliance:  excellent  B.  Mental Health:  Good mental attitude and Quality of Life (QOL)  changes:  Overall  1.8 %, Health/Functioning 7.8 %, Socioeconomics -2.5 %, Psych/Spiritual -2.4 %, Family 0 %    C.  Education/Instruction/Skills  Accurately checks own pulse.  Rest:  88  Exercise:  124, Knows THR for exercise, Uses Perceived Exertion Scale and/or Dyspnea Scale and Attended 13/13 education classes  Home exercise given: 02/17/2011  D.  Nutrition/Weight Control/Body Composition:  Adherence to prescribed nutrition program: good , % Body Fat  29.6 and Patient has lost 0.8 kg BMI 20.6, evidence of body fat loss.  *This section completed by Mickle Plumb, M.Ed, RD, LDN, CDE  E.  Blood Lipids    Lab Results  Component Value Date   CHOL 107 01/23/2011     Lab Results  Component Value Date   TRIG 53.0 01/23/2011     Lab Results  Component Value Date   HDL 52.60 01/23/2011     Lab Results  Component Value Date   CHOLHDL 2 01/23/2011    F.  Lifestyle Changes:  Making positive lifestyle changes  G.  Symptoms noted with exercise:  Exaggerated post exercise hypotension 10/22 and 10/19  Report Completed By:  Hazle Nordmann   Comments:  Pt did very well in program, progressing from 3.0 METs to 4.2 METs.  Pt plans to continue to exercise by walking.  At d/c pt rhythm was sinus.  Thanks for the referral. Fabio Pierce,  MA, ACSM RCEP

## 2011-05-09 ENCOUNTER — Encounter (HOSPITAL_COMMUNITY): Payer: Medicare Other

## 2011-05-12 ENCOUNTER — Other Ambulatory Visit: Payer: Self-pay

## 2011-05-12 DIAGNOSIS — E7800 Pure hypercholesterolemia, unspecified: Secondary | ICD-10-CM

## 2011-05-12 DIAGNOSIS — I2510 Atherosclerotic heart disease of native coronary artery without angina pectoris: Secondary | ICD-10-CM

## 2011-05-12 MED ORDER — ROSUVASTATIN CALCIUM 10 MG PO TABS: 10.0000 mg | ORAL_TABLET | Freq: Every day | ORAL | Status: DC

## 2011-05-29 ENCOUNTER — Encounter (HOSPITAL_COMMUNITY): Payer: Self-pay | Admitting: *Deleted

## 2011-05-29 NOTE — Progress Notes (Addendum)
Agree with progress report from cardiac rehab on 05/08/2011 written by Trevor Mace and and Heloise Purpura.  Dr Clifton James was notified of hypotensive events.

## 2011-07-08 ENCOUNTER — Ambulatory Visit (INDEPENDENT_AMBULATORY_CARE_PROVIDER_SITE_OTHER): Payer: Medicare Other | Admitting: Cardiovascular Disease

## 2011-07-08 ENCOUNTER — Encounter: Payer: Self-pay | Admitting: Cardiovascular Disease

## 2011-07-08 VITALS — BP 127/75 | HR 69 | Ht 62.0 in | Wt 111.0 lb

## 2011-07-08 DIAGNOSIS — I2510 Atherosclerotic heart disease of native coronary artery without angina pectoris: Secondary | ICD-10-CM

## 2011-07-08 DIAGNOSIS — R0609 Other forms of dyspnea: Secondary | ICD-10-CM

## 2011-07-08 DIAGNOSIS — R0600 Dyspnea, unspecified: Secondary | ICD-10-CM | POA: Insufficient documentation

## 2011-07-08 NOTE — Patient Instructions (Signed)

## 2011-07-08 NOTE — Assessment & Plan Note (Signed)
Stable. Will continue ASA, plavix for at least one year. Will continue statin. She did not tolerate beta blockers secondary to hypotension.

## 2011-07-08 NOTE — Assessment & Plan Note (Signed)
This is most likely non-cardiac but will get echo to assess LVEF and exclude valvular disease.

## 2011-07-08 NOTE — Progress Notes (Signed)
History of Present Illness: 66 yo female with h/o CAD, COPD, GERD, depression, OA admitted to [facility] 12/28/10 with a NSTEMI. Cardiac cath with severe disease LAD. A drug eluting stent was placed in the mid LAD on 12/28/10. Staged intervention of mid RCA was performed 12/30/10 with a drug eluting stent placed in the mid RCA by Dr. Excell Seltzer.   She is her today for follow up. She tells me that she has been feeling well. No chest pain. She has occasional SOB.  This is mostly with exertion. She has been tolerating all medications. She completed cardiac rehab. She exercises every day.    Primary Care Physician: Marinda Elk  Last Lipid Profile: September 2012: total chol: 107   HDL 53   LDL 44  Past Medical History  Diagnosis Date  . Emphysema   . Depression   . Migraine   . TIA (transient ischemic attack) 1995  . Arthritis   . Coronary artery disease August 2012    Drug eluting stent mid LAD, drug eluting stent mid RCA  . Heart attack August 2012    Past Surgical History  Procedure Date  . Appendectomy   . Tonsillectomy   . Thoracotomy   . Foot surgery   . Nasal septum surgery   . Shoulder surgery   . Hand surgery     Current Outpatient Prescriptions  Medication Sig Dispense Refill  . albuterol (PROVENTIL HFA) 108 (90 BASE) MCG/ACT inhaler Inhale 2 puffs into the lungs every 6 (six) hours as needed.        Marland Kitchen aspirin 81 MG chewable tablet Chew 81 mg by mouth daily.        . Cholecalciferol (VITAMIN D) 2000 UNITS CAPS Take by mouth daily.        . clopidogrel (PLAVIX) 75 MG tablet Take 1 tablet (75 mg total) by mouth daily.  90 tablet  3  . ESTRACE VAGINAL 0.1 MG/GM vaginal cream as directed.      . rosuvastatin (CRESTOR) 10 MG tablet Take 1 tablet (10 mg total) by mouth at bedtime.  90 tablet  3  . sertraline (ZOLOFT) 50 MG tablet Take by mouth daily.        Marland Kitchen tiotropium (SPIRIVA) 18 MCG inhalation capsule Place 1 capsule (18 mcg total) into inhaler and inhale daily.   90 capsule  3  . topiramate (TOPAMAX) 100 MG tablet Take by mouth daily.          Allergies  Allergen Reactions  . Naproxen Hives  . Tramadol Hcl Nausea And Vomiting    History   Social History  . Marital Status: Married    Spouse Name: N/A    Number of Children: N/A  . Years of Education: N/A   Occupational History  . Not on file.   Social History Main Topics  . Smoking status: Former Smoker -- 1.0 packs/day for 10 years    Types: Cigarettes    Quit date: 05/06/1971  . Smokeless tobacco: Not on file  . Alcohol Use: No  . Drug Use: No  . Sexually Active: Not on file   Other Topics Concern  . Not on file   Social History Narrative  . No narrative on file    Family History  Problem Relation Age of Onset  . Heart disease Paternal Grandmother   . Heart disease Paternal Grandfather   . Rheum arthritis Maternal Grandmother   . Rheum arthritis Maternal Grandfather   . Lung cancer Maternal  Uncle   . Diabetes Mother   . Diabetes Maternal Aunt   . Diabetes Maternal Grandmother     Review of Systems:  As stated in the HPI and otherwise negative.   BP 127/75  Pulse 69  Ht 5\' 2"  (1.575 m)  Wt 111 lb (50.349 kg)  BMI 20.30 kg/m2  Physical Examination: General: Well developed, well nourished, NAD HEENT: OP clear, mucus membranes moist SKIN: warm, dry. No rashes. Neuro: No focal deficits Musculoskeletal: Muscle strength 5/5 all ext Psychiatric: Mood and affect normal Neck: No JVD, no carotid bruits, no thyromegaly, no lymphadenopathy. Lungs:Clear bilaterally, no wheezes, rhonci, crackles Cardiovascular: Regular rate and rhythm. No murmurs, gallops or rubs. Abdomen:Soft. Bowel sounds present. Non-tender.  Extremities: No lower extremity edema. Pulses are 2 + in the bilateral DP/PT.

## 2011-07-18 ENCOUNTER — Ambulatory Visit (HOSPITAL_COMMUNITY): Payer: Medicare Other | Attending: Internal Medicine

## 2011-07-18 ENCOUNTER — Other Ambulatory Visit: Payer: Self-pay

## 2011-07-18 DIAGNOSIS — I2510 Atherosclerotic heart disease of native coronary artery without angina pectoris: Secondary | ICD-10-CM | POA: Insufficient documentation

## 2011-07-18 DIAGNOSIS — R0989 Other specified symptoms and signs involving the circulatory and respiratory systems: Secondary | ICD-10-CM | POA: Insufficient documentation

## 2011-07-18 DIAGNOSIS — R0609 Other forms of dyspnea: Secondary | ICD-10-CM | POA: Insufficient documentation

## 2011-07-23 ENCOUNTER — Telehealth: Payer: Self-pay | Admitting: Cardiovascular Disease

## 2011-07-23 NOTE — Telephone Encounter (Signed)
Fu call °Patient returning your call °

## 2011-07-23 NOTE — Telephone Encounter (Signed)
Spoke with pt and gave her echo results

## 2011-11-17 ENCOUNTER — Ambulatory Visit (INDEPENDENT_AMBULATORY_CARE_PROVIDER_SITE_OTHER): Payer: Medicare Other | Admitting: Emergency Medicine

## 2011-11-17 ENCOUNTER — Encounter: Payer: Self-pay | Admitting: Emergency Medicine

## 2011-11-17 VITALS — BP 114/66 | HR 80 | Temp 98.3°F | Ht 62.5 in | Wt 115.2 lb

## 2011-11-17 DIAGNOSIS — R059 Cough, unspecified: Secondary | ICD-10-CM

## 2011-11-17 DIAGNOSIS — R05 Cough: Secondary | ICD-10-CM

## 2011-11-17 MED ORDER — PREDNISONE 20 MG PO TABS: 40.0000 mg | ORAL_TABLET | Freq: Every day | ORAL | Status: AC

## 2011-11-17 NOTE — Progress Notes (Signed)
Subjective:    Patient ID: [name], female    DOB: 1945/08/08, 66 y.o.   MRN: [id number]  HPI Comments: [name] is a 66 year old woman with mild COPD and hyperinflation per her pulmonary function testing.  Also with chronic cough, has been treated for GERD and with loratadine/nasal steroid. The GERD therapy did not seem to change the cough, but she may have benefitted from the allergy rx.     ROS: Still with daily cough - paroxysms. Non-productive. Has been rx for allergies before - immunotherapy.  No GERD sx.     ROV 04/10/09 -- has been doing well for the last yr on Spiriva until about 3 -4 weeks ago. At that time she developed R sided CP that feels "deep", worse with inspiration. Some associated SOB, also with more cough. She has been having persistant dry cough, was better for a while without intervention (no longer on an allergy regimen).     ROV 04/25/09 -- returns for f/u cough. CT scan of the chest reassuring, no evidence PE. Still with dry cough. Has been treated for GERD and allergies in the past without great change in the cough.     ROV 04/15/10 -- F/u for her chronic cough, mild COPD. She still has daily cough, cycles of 3-7 days o fterrible paroxysmal cough, then back to her usual cough pattern. Has been treated in past for both GERD and allergies. Taking Spiriva in the am, never needs ProAir, breathing is stable. Not currently on loratadine/nasal steroid. No PND, no GERD signs.  >>referred to ENT w/ nml laryngoscope, tx w/ PPI and neurontin-unable to tolerate neurontin.   07/30/2010 Acute OV- Presents for an acute office visit. Complains of 4 weeks of right sided chest pain that occurs when she takes deep breath or coughs. Mild in nature but has not resolved. NO meds used. She has hx of chronic cough - it is at baseline. Cough is dry w/ no discolored mucus. NO exertional chest pain, syncope, palpitations, recent travel or calf swelling/pain. Nontender to touch, and does not  reproduce with movement except deep inspiration. Last mammogram 04/2010 nml per pt.   ROV 04/08/11 -- hx mild COPD, chronic cough with GERD and PND. Was seen by Dr Jenne Pane for ENT exam 05/2010 - normal pharynx. Since last visit had acute MI, s/p PTCI, following w Dr Dicie Beam. Attending cardiac rehab. Breathing fairly well. Taking Spiriva. Cough is stable. Very rare rescue inhaler use. She had flu shot and pneumovax this year. No exacerbations since last visit.   ROV 11/17/11 -- hx mild COPD, chronic cough with GERD and PND. Since last time she had done ok, then about 3 weeks ago some increased mucous and cough. Was rarely productive. May be some better, but still not back down to baseline. She is still taking her spiriva.     Objective:   Physical Exam Gen: Pleasant, well-nourished, in no distress,  normal affect  ENT: No lesions,  mouth clear,  oropharynx clear, no postnasal drip  Neck: No JVD, no TMG, no carotid bruits  Lungs: No use of accessory muscles, no dullness to percussion, clear without rales or rhonchi Chest wall nontender - no eccymosis noted.  No axillary adenopathy.   Cardiovascular: RRR, heart sounds normal, no murmur or gallops, no peripheral edema  Abdomen: soft and NT, no HSM,  BS normal  Musculoskeletal: No deformities, no cyanosis or clubbing Cervical rom nm w/ no radicular symptoms   Neuro: alert, non focal  Skin: Warm,  no lesions or rashes     Assessment & Plan:  COUGH, CHRONIC With recent flare and continue cough - voice rest x 3 days with pred burst - rov if cough persists and will w/u further

## 2011-11-17 NOTE — Assessment & Plan Note (Signed)
With recent flare and continue cough - voice rest x 3 days with pred burst - rov if cough persists and will w/u further

## 2011-11-17 NOTE — Patient Instructions (Addendum)
Please continue your Spiriva We will plan for 3 days of voice rest - do not talk, sing or use your voice for the 3 days Take prednisone 40mg  daily for 4 days, starting the day before your voice rest Use Delsym cough syrup as needed to suppress the cough during this time Try to minimize throat clearing  Call our office if the cough isn;t better Otherwise, follow up in 6 months

## 2011-12-15 ENCOUNTER — Encounter: Payer: Self-pay | Admitting: Cardiovascular Disease

## 2012-01-15 ENCOUNTER — Ambulatory Visit (INDEPENDENT_AMBULATORY_CARE_PROVIDER_SITE_OTHER): Payer: Medicare Other | Admitting: Cardiovascular Disease

## 2012-01-15 ENCOUNTER — Encounter: Payer: Self-pay | Admitting: Cardiovascular Disease

## 2012-01-15 VITALS — BP 122/75 | HR 71 | Ht 62.0 in | Wt 114.0 lb

## 2012-01-15 DIAGNOSIS — I2510 Atherosclerotic heart disease of native coronary artery without angina pectoris: Secondary | ICD-10-CM

## 2012-01-15 DIAGNOSIS — I25810 Atherosclerosis of coronary artery bypass graft(s) without angina pectoris: Secondary | ICD-10-CM

## 2012-01-15 DIAGNOSIS — E7800 Pure hypercholesterolemia, unspecified: Secondary | ICD-10-CM

## 2012-01-15 MED ORDER — ROSUVASTATIN CALCIUM 10 MG PO TABS: 10.0000 mg | ORAL_TABLET | Freq: Every day | ORAL | Status: DC

## 2012-01-15 MED ORDER — NITROGLYCERIN 0.4 MG SL SUBL: 0.4000 mg | SUBLINGUAL_TABLET | SUBLINGUAL | Status: DC | PRN

## 2012-01-15 MED ORDER — CLOPIDOGREL BISULFATE 75 MG PO TABS: 75.0000 mg | ORAL_TABLET | Freq: Every day | ORAL | Status: DC

## 2012-01-15 NOTE — Progress Notes (Signed)
History of Present Illness: 66 yo female with h/o CAD, COPD, GERD, depression, OA admitted to [facility] 12/28/10 with a NSTEMI. Cardiac cath with severe disease LAD. A drug eluting stent was placed in the mid LAD on 12/28/10. Staged intervention of mid RCA was performed 12/30/10 with a drug eluting stent placed in the mid RCA by Dr. Excell Seltzer.   She is here today for follow up. She tells me that she has been feeling well. No chest pain.  She has some SOB when exercising but this is unchanged. Echo March 2013 in w/u of dyspnea with normal LV function and no valve issues. She has seen Dr. Delton Coombes with Pulmonology recently and had a good report.  She has been tolerating all medications. She exercises every day. [facility]   Primary Care Physician: Marinda Elk   Last Lipid Profile: Total chol: 153   HDL: 66  LDL: 59   Past Medical History  Diagnosis Date  . Emphysema   . Depression   . Migraine   . TIA (transient ischemic attack) 1995  . Arthritis   . Coronary artery disease August 2012    Drug eluting stent mid LAD, drug eluting stent mid RCA  . Heart attack August 2012    Past Surgical History  Procedure Date  . Appendectomy   . Tonsillectomy   . Thoracotomy   . Foot surgery   . Nasal septum surgery   . Shoulder surgery   . Hand surgery     Current Outpatient Prescriptions  Medication Sig Dispense Refill  . albuterol (PROVENTIL HFA) 108 (90 BASE) MCG/ACT inhaler Inhale 2 puffs into the lungs every 6 (six) hours as needed.        Marland Kitchen aspirin 81 MG chewable tablet Chew 81 mg by mouth daily.        . Cholecalciferol (VITAMIN D) 2000 UNITS CAPS Take by mouth daily.        . clopidogrel (PLAVIX) 75 MG tablet Take 1 tablet (75 mg total) by mouth daily.  90 tablet  3  . rosuvastatin (CRESTOR) 10 MG tablet Take 1 tablet (10 mg total) by mouth at bedtime.  90 tablet  3  . sertraline (ZOLOFT) 50 MG tablet Take by mouth daily.        Marland Kitchen tiotropium (SPIRIVA) 18 MCG inhalation  capsule Place 1 capsule (18 mcg total) into inhaler and inhale daily.  90 capsule  3  . topiramate (TOPAMAX) 100 MG tablet Take by mouth daily.          Allergies  Allergen Reactions  . Naproxen Hives  . Tramadol Hcl Nausea And Vomiting    History   Social History  . Marital Status: Married    Spouse Name: N/A    Number of Children: N/A  . Years of Education: N/A   Occupational History  . Not on file.   Social History Main Topics  . Smoking status: Former Smoker -- 1.0 packs/day for 10 years    Types: Cigarettes    Quit date: 05/06/1971  . Smokeless tobacco: Not on file  . Alcohol Use: No  . Drug Use: No  . Sexually Active: Not on file   Other Topics Concern  . Not on file   Social History Narrative  . No narrative on file    Family History  Problem Relation Age of Onset  . Heart disease Paternal Grandmother   . Heart disease Paternal Grandfather   . Rheum arthritis  Maternal Grandmother   . Rheum arthritis Maternal Grandfather   . Lung cancer Maternal Uncle   . Diabetes Mother   . Diabetes Maternal Aunt   . Diabetes Maternal Grandmother     Review of Systems:  As stated in the HPI and otherwise negative.   BP 122/75  Pulse 71  Ht 5\' 2"  (1.575 m)  Wt 114 lb (51.71 kg)  BMI 20.85 kg/m2  Physical Examination: General: Well developed, well nourished, NAD HEENT: OP clear, mucus membranes moist SKIN: warm, dry. No rashes. Neuro: No focal deficits Musculoskeletal: Muscle strength 5/5 all ext Psychiatric: Mood and affect normal Neck: No JVD, no carotid bruits, no thyromegaly, no lymphadenopathy. Lungs:Clear bilaterally, no wheezes, rhonci, crackles Cardiovascular: Regular rate and rhythm. No murmurs, gallops or rubs. Abdomen:Soft. Bowel sounds present. Non-tender.  Extremities: No lower extremity edema. Pulses are 2 + in the bilateral DP/PT.  EKG: NSR, rate 71 bpm.   Echo March 2013:  Left ventricle: LVEF is approximately 55 th 60% with  mild inferoseptal hypokinesis. False tendon at LV apex. The cavity size was normal. Wall thickness was normal. Systolic function was normal. The estimated ejection fraction was in the range of 55% to 60%. Doppler parameters are consistent with abnormal left ventricular relaxation (grade 1 diastolic dysfunction).   Assessment and Plan:   1. CAD:  Stable. Will continue ASA/Plavix. I suspect that she has dyspnea from small vessel CAD. No angina. She has not been a beta blocker secondary to hypotension and she refuses to start one at this time. She is on a statin and lipids are at goal. Her BP is at goal. No changes today. Continue heart healthy diet and daily exercise.

## 2012-01-15 NOTE — Patient Instructions (Addendum)
Your physician wants you to follow-up in:  12 months.  You will receive a reminder letter in the mail two months in advance. If you don't receive a letter, please call our office to schedule the follow-up appointment.   

## 2012-05-15 IMAGING — CR DG CHEST 2V
2 series · 2 of 2 positions shown · non-contrast
Comparison: Chest x-ray 05/31/2008.

CLINICAL DATA: Right-sided chest pain.

CHEST - 2 VIEW

[view not recorded (1 of 2)]
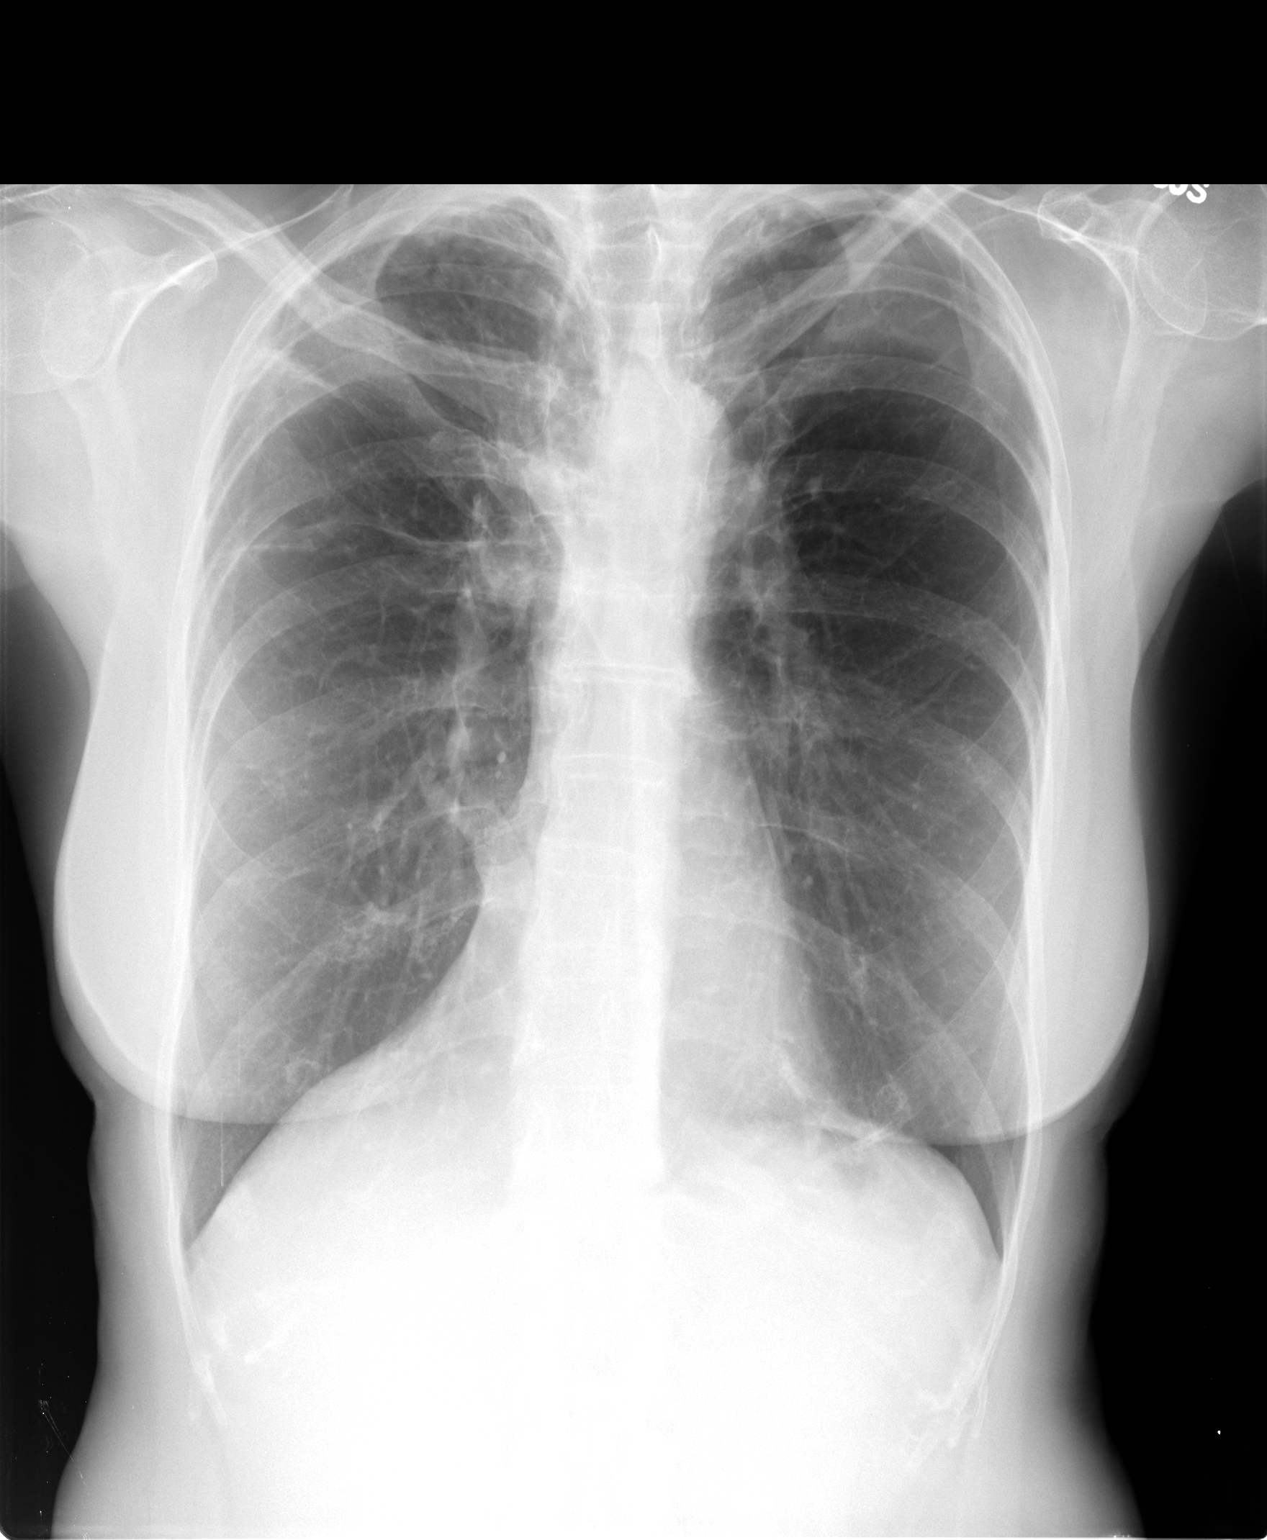

[view not recorded (2 of 2)]
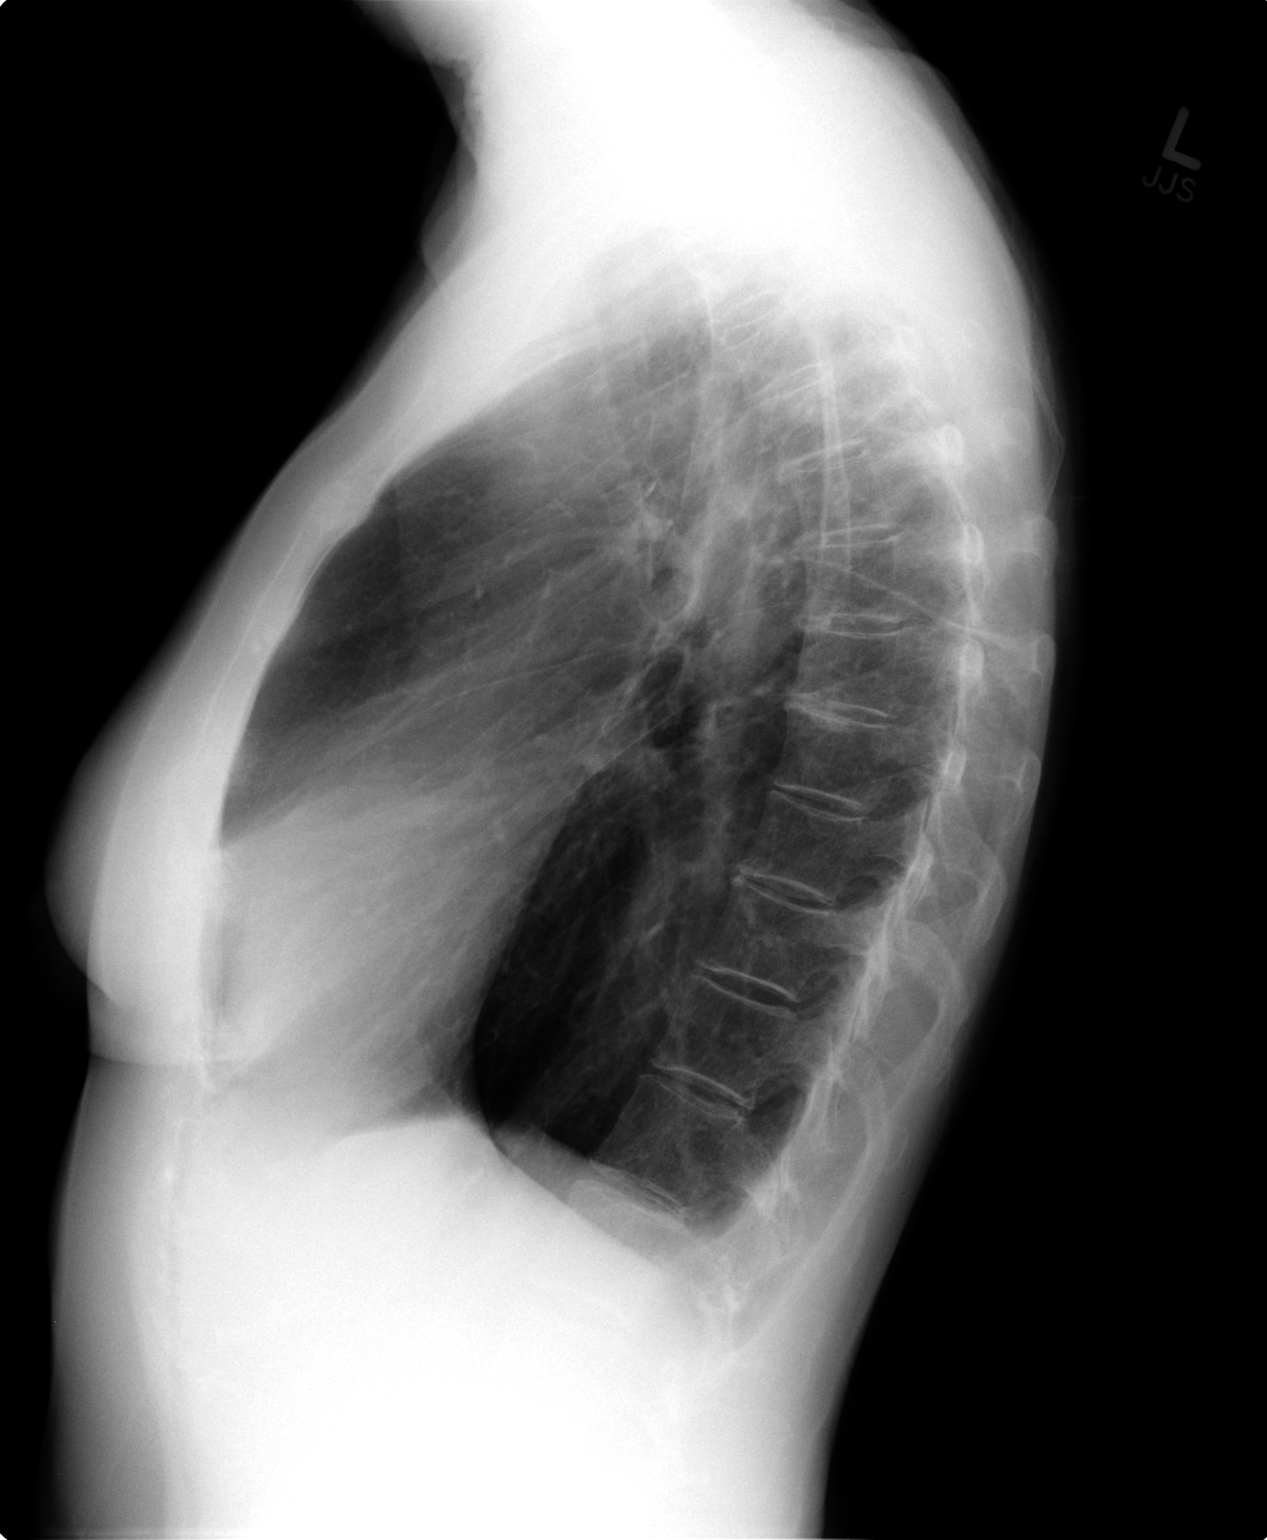

[2 of 2 positions shown; findings below may reference images not displayed]

FINDINGS: The cardiac silhouette, mediastinal and hilar contours
are within normal limits and stable.  Stable emphysematous changes
and biapical pleural and parenchymal scarring, right greater than
left.  No acute overlying pulmonary process is demonstrated.  The
bony thorax is intact. A right lower lobe pulmonary AV fistula is
again demonstrated.
IMPRESSION: Chronic lung changes but no acute pulmonary findings.

## 2012-05-19 ENCOUNTER — Ambulatory Visit (INDEPENDENT_AMBULATORY_CARE_PROVIDER_SITE_OTHER): Payer: Medicare Other | Admitting: Emergency Medicine

## 2012-05-19 ENCOUNTER — Encounter: Payer: Self-pay | Admitting: Emergency Medicine

## 2012-05-19 VITALS — BP 110/70 | HR 78 | Ht 62.5 in | Wt 120.0 lb

## 2012-05-19 DIAGNOSIS — J449 Chronic obstructive pulmonary disease, unspecified: Secondary | ICD-10-CM

## 2012-05-19 DIAGNOSIS — R05 Cough: Secondary | ICD-10-CM

## 2012-05-19 NOTE — Progress Notes (Signed)
Subjective:    Patient ID: [name], female    DOB: 02/25/1946, 67 y.o.   MRN: [id number]  HPI Comments: Ms. Rohlfs is a 67 year old woman with mild COPD and hyperinflation per her pulmonary function testing.  Also with chronic cough, has been treated for GERD and with loratadine/nasal steroid. The GERD therapy did not seem to change the cough, but she may have benefitted from the allergy rx.     ROS: Still with daily cough - paroxysms. Non-productive. Has been rx for allergies before - immunotherapy.  No GERD sx.     ROV 04/10/09 -- has been doing well for the last yr on Spiriva until about 3 -4 weeks ago. At that time she developed R sided CP that feels "deep", worse with inspiration. Some associated SOB, also with more cough. She has been having persistant dry cough, was better for a while without intervention (no longer on an allergy regimen).     ROV 04/25/09 -- returns for f/u cough. CT scan of the chest reassuring, no evidence PE. Still with dry cough. Has been treated for GERD and allergies in the past without great change in the cough.     ROV 04/15/10 -- F/u for her chronic cough, mild COPD. She still has daily cough, cycles of 3-7 days o fterrible paroxysmal cough, then back to her usual cough pattern. Has been treated in past for both GERD and allergies. Taking Spiriva in the am, never needs ProAir, breathing is stable. Not currently on loratadine/nasal steroid. No PND, no GERD signs.  >>referred to ENT w/ nml laryngoscope, tx w/ PPI and neurontin-unable to tolerate neurontin.   07/30/2010 Acute OV- Presents for an acute office visit. Complains of 4 weeks of right sided chest pain that occurs when she takes deep breath or coughs. Mild in nature but has not resolved. NO meds used. She has hx of chronic cough - it is at baseline. Cough is dry w/ no discolored mucus. NO exertional chest pain, syncope, palpitations, recent travel or calf swelling/pain. Nontender to touch, and does not  reproduce with movement except deep inspiration. Last mammogram 04/2010 nml per pt.   ROV 04/08/11 -- hx mild COPD, chronic cough with GERD and PND. Was seen by Dr Jenne Pane for ENT exam 05/2010 - normal pharynx. Since last visit had acute MI, s/p PTCI, following w Dr Dicie Beam. Attending cardiac rehab. Breathing fairly well. Taking Spiriva. Cough is stable. Very rare rescue inhaler use. She had flu shot and pneumovax this year. No exacerbations since last visit.   ROV 11/17/11 -- hx mild COPD, chronic cough with GERD and PND. Since last time she had done ok, then about 3 weeks ago some increased mucous and cough. Was rarely productive. May be some better, but still not back down to baseline. She is still taking her spiriva.   ROV 05/19/12 -- hx mild COPD, chronic cough with GERD and PND. Regular f/u. Her cough is stable, not really worse or better. Likely due to her grass and tree exposure. Occasional exertional SOB. Remains on Spiriva + SABA prn.    Objective:   Physical Exam Filed Vitals:   05/19/12 1437  BP: 110/70  Pulse: 78   Gen: Pleasant, well-nourished, in no distress,  normal affect  ENT: No lesions,  mouth clear,  oropharynx clear, no postnasal drip  Neck: No JVD, no TMG, no carotid bruits  Lungs: No use of accessory muscles, clear without rales or rhonchi Chest wall nontender - no eccymosis noted.  No axillary adenopathy.   Cardiovascular: RRR, heart sounds normal, no murmur or gallops, no peripheral edema  Musculoskeletal: No deformities, no cyanosis or clubbing Cervical rom nm w/ no radicular symptoms   Neuro: alert, non focal  Skin: Warm, no lesions or rashes     Assessment & Plan:  COPD - continue spiriva + SABA - rov 1 yr  COUGH, CHRONIC Due to allergies, but not bothersome enough for her to do meds. She will let me know if it becomes bothersome enough to start anything

## 2012-05-19 NOTE — Assessment & Plan Note (Signed)
Due to allergies, but not bothersome enough for her to do meds. She will let me know if it becomes bothersome enough to start anything

## 2012-05-19 NOTE — Patient Instructions (Addendum)
Please continue your Spiriva daily Use albuterol 2 puffs if needed Follow with Dr Delton Coombes in 12 months or sooner if you have any problems

## 2012-05-19 NOTE — Assessment & Plan Note (Signed)
-   continue spiriva + SABA - rov 1 yr

## 2012-10-22 ENCOUNTER — Telehealth: Payer: Self-pay | Admitting: Emergency Medicine

## 2012-10-22 MED ORDER — TIOTROPIUM BROMIDE MONOHYDRATE 18 MCG IN CAPS: 18.0000 ug | ORAL_CAPSULE | Freq: Every day | RESPIRATORY_TRACT | Status: DC

## 2012-10-22 NOTE — Telephone Encounter (Signed)
Pt aware.

## 2012-10-22 NOTE — Telephone Encounter (Signed)
lmomtcb x1 rx sent 

## 2012-11-15 ENCOUNTER — Telehealth: Payer: Self-pay | Admitting: Emergency Medicine

## 2012-11-15 MED ORDER — TIOTROPIUM BROMIDE MONOHYDRATE 18 MCG IN CAPS
18.0000 ug | ORAL_CAPSULE | Freq: Every day | RESPIRATORY_TRACT | Status: DC
Start: 2012-11-15 — End: 2014-07-06

## 2012-11-15 NOTE — Telephone Encounter (Signed)
Spoke to pt. When she called a few weeks ago, it was not sent to [facility]. The rx was "printed."  Rx has been sent in to [facility]. Pt is aware. Nothing further was needed.

## 2013-01-17 ENCOUNTER — Ambulatory Visit (INDEPENDENT_AMBULATORY_CARE_PROVIDER_SITE_OTHER): Payer: Medicare Other | Admitting: Cardiovascular Disease

## 2013-01-17 ENCOUNTER — Encounter: Payer: Self-pay | Admitting: Cardiovascular Disease

## 2013-01-17 VITALS — BP 124/74 | HR 70 | Ht 62.5 in | Wt 114.8 lb

## 2013-01-17 DIAGNOSIS — I2510 Atherosclerotic heart disease of native coronary artery without angina pectoris: Secondary | ICD-10-CM

## 2013-01-17 DIAGNOSIS — I25810 Atherosclerosis of coronary artery bypass graft(s) without angina pectoris: Secondary | ICD-10-CM

## 2013-01-17 MED ORDER — CLOPIDOGREL BISULFATE 75 MG PO TABS: 75.0000 mg | ORAL_TABLET | Freq: Every day | ORAL | Status: DC

## 2013-01-17 MED ORDER — NITROGLYCERIN 0.4 MG SL SUBL: 0.4000 mg | SUBLINGUAL_TABLET | SUBLINGUAL | Status: DC | PRN

## 2013-01-17 NOTE — Patient Instructions (Addendum)
Your physician wants you to follow-up in: 6 months. You will receive a reminder letter in the mail two months in advance. If you don't receive a letter, please call our office to schedule the follow-up appointment.   Your physician has requested that you have en exercise stress myoview. For further information please visit [email or web address] Please follow instruction sheet, as given.

## 2013-01-17 NOTE — Progress Notes (Signed)
History of Present Illness: 67 yo female with h/o CAD, COPD, GERD, depression, OA admitted to [facility] 12/28/10 with a NSTEMI. Cardiac cath with severe disease LAD. A 2.5 x 20 mm Promus drug eluting stent was placed in the mid LAD on 12/28/10. Staged intervention of mid RCA on 12/30/10 with a 3.0 x 12 mm Promus drug eluting stent was placed in the mid RCA. Echo March 2013 in w/u of dyspnea with normal LV function and no valve issues.   She is here today for follow up. She tells me that she has been feeling well. No exertional chest pain.  She does have resting chest pains. She continues to have has SOB when exercising. She has been tolerating all medications. She exercises every day[facility]   Primary Care Physician: Marinda Elk   Last Lipid Profile:  01/05/13:  Total chol: 140  HDL: 67  LDL: 57   Past Medical History  Diagnosis Date  . Emphysema   . Depression   . Migraine   . TIA (transient ischemic attack) 1995  . Arthritis   . Coronary artery disease August 2012    Drug eluting stent mid LAD, drug eluting stent mid RCA  . Heart attack August 2012    Past Surgical History  Procedure Laterality Date  . Appendectomy    . Tonsillectomy    . Thoracotomy    . Foot surgery    . Nasal septum surgery    . Shoulder surgery    . Hand surgery      Current Outpatient Prescriptions  Medication Sig Dispense Refill  . albuterol (PROVENTIL HFA) 108 (90 BASE) MCG/ACT inhaler Inhale 2 puffs into the lungs every 6 (six) hours as needed.        Marland Kitchen aspirin 81 MG chewable tablet Chew 81 mg by mouth daily.        . CELEBREX 200 MG capsule Take 200 mg by mouth daily.       . Cholecalciferol (VITAMIN D) 2000 UNITS CAPS Take by mouth daily.        . clopidogrel (PLAVIX) 75 MG tablet Take 1 tablet (75 mg total) by mouth daily.  90 tablet  3  . nitroGLYCERIN (NITROSTAT) 0.4 MG SL tablet Place 1 tablet (0.4 mg total) under the tongue every 5 (five) minutes as needed for chest pain.   25 tablet  6  . rosuvastatin (CRESTOR) 10 MG tablet Take 1 tablet (10 mg total) by mouth at bedtime.  90 tablet  3  . sertraline (ZOLOFT) 50 MG tablet Take by mouth daily.        Marland Kitchen tiotropium (SPIRIVA) 18 MCG inhalation capsule Place 1 capsule (18 mcg total) into inhaler and inhale daily.  90 capsule  3  . topiramate (TOPAMAX) 100 MG tablet Take by mouth daily.         No current facility-administered medications for this visit.    Allergies  Allergen Reactions  . Naproxen Hives  . Tramadol Hcl Nausea And Vomiting    History   Social History  . Marital Status: Married    Spouse Name: N/A    Number of Children: N/A  . Years of Education: N/A   Occupational History  . Not on file.   Social History Main Topics  . Smoking status: Former Smoker -- 1.00 packs/day for 10 years    Types: Cigarettes    Quit date: 05/06/1971  . Smokeless tobacco: Never Used  . Alcohol  Use: No  . Drug Use: No  . Sexual Activity: Not on file   Other Topics Concern  . Not on file   Social History Narrative  . No narrative on file    Family History  Problem Relation Age of Onset  . Heart disease Paternal Grandmother   . Heart disease Paternal Grandfather   . Rheum arthritis Maternal Grandmother   . Rheum arthritis Maternal Grandfather   . Lung cancer Maternal Uncle   . Diabetes Mother   . Diabetes Maternal Aunt   . Diabetes Maternal Grandmother     Review of Systems:  As stated in the HPI and otherwise negative.   BP 124/74  Pulse 70  Ht 5' 2.5" (1.588 m)  Wt 114 lb 12.8 oz (52.073 kg)  BMI 20.65 kg/m2  SpO2 92%  Physical Examination: General: Well developed, well nourished, NAD HEENT: OP clear, mucus membranes moist SKIN: warm, dry. No rashes. Neuro: No focal deficits Musculoskeletal: Muscle strength 5/5 all ext Psychiatric: Mood and affect normal Neck: No JVD, no carotid bruits, no thyromegaly, no lymphadenopathy. Lungs:Clear bilaterally, no wheezes, rhonci,  crackles Cardiovascular: Regular rate and rhythm. No murmurs, gallops or rubs. Abdomen:Soft. Bowel sounds present. Non-tender.  Extremities: No lower extremity edema. Pulses are 2 + in the bilateral DP/PT.  EKG: NSR, rate 69 bpm. Normal EKG  Echo March 2013:  Left ventricle: LVEF is approximately 55 th 60% with mild inferoseptal hypokinesis. False tendon at LV apex. The cavity size was normal. Wall thickness was normal. Systolic function was normal. The estimated ejection fraction was in the range of 55% to 60%. Doppler parameters are consistent with abnormal left ventricular relaxation (grade 1 diastolic dysfunction).  Assessment and Plan:   1. CAD:  Stable. Will continue ASA/Plavix. I suspect that she has dyspnea from small vessel CAD. No angina. She has not been on a beta blocker secondary to hypotension and she does not wish to start one. She is on a statin and lipids are at goal. Her BP is at goal.  Continue heart healthy diet and daily exercise. Will arrange exercise stress myoview to exclude ischemia with dyspnea and chest pain.

## 2013-02-22 ENCOUNTER — Encounter: Payer: Medicare Other | Admitting: Nurse Practitioner

## 2013-02-24 ENCOUNTER — Ambulatory Visit (HOSPITAL_COMMUNITY): Payer: Medicare Other | Attending: Internal Medicine | Admitting: Radiology

## 2013-02-24 VITALS — BP 155/82 | HR 66 | Ht 62.5 in | Wt 116.0 lb

## 2013-02-24 DIAGNOSIS — I2510 Atherosclerotic heart disease of native coronary artery without angina pectoris: Secondary | ICD-10-CM

## 2013-02-24 DIAGNOSIS — R079 Chest pain, unspecified: Secondary | ICD-10-CM

## 2013-02-24 DIAGNOSIS — R0602 Shortness of breath: Secondary | ICD-10-CM

## 2013-02-24 MED ORDER — TECHNETIUM TC 99M SESTAMIBI GENERIC - CARDIOLITE
33.0000 | Freq: Once | INTRAVENOUS | Status: AC | PRN
  Administered 2013-02-24: 33 via INTRAVENOUS

## 2013-02-24 MED ORDER — TECHNETIUM TC 99M SESTAMIBI GENERIC - CARDIOLITE
11.0000 | Freq: Once | INTRAVENOUS | Status: AC | PRN
  Administered 2013-02-24: 11 via INTRAVENOUS

## 2013-02-24 NOTE — Progress Notes (Signed)
[facility] SITE 3 NUCLEAR MED [address] [place], [place] [address] [phone]    Cardiology Nuclear Med Study  [name] is a 67 y.o. female     MRN : [id number]     DOB: Feb 17, 1946  Procedure Date: 02/24/2013  Nuclear Med Background Indication for Stress Test:  Evaluation for Ischemia and Stent Patency History:  COPD Cardiac Risk Factors: History of Smoking, Lipids and TIA  Symptoms:  Chest Pain (last chest discomfort was one week ago), DOE, Fatigue, Palpitations and SOB   Nuclear Pre-Procedure Caffeine/Decaff Intake:  None NPO After: 7:00am   Lungs:  clear O2 Sat: 92% on room air. IV 0.9% NS with Angio Cath:  22g  IV Site: R Antecubital  IV Started by:  Bonnita Levan, RN  Chest Size (in):  34 Cup Size: A  Height: 5' 2.5" (1.588 m)  Weight:  116 lb (52.617 kg)  BMI:  Body mass index is 20.87 kg/(m^2). Tech Comments:  N/A    Nuclear Med Study 1 or 2 day study: 1 day  Stress Test Type:  Stress  Reading MD: Dietrich Pates, MD  Order Authorizing Provider:  Verne Carrow, MD  Resting Radionuclide: Technetium 30m Sestamibi  Resting Radionuclide Dose: 11.0 mCi   Stress Radionuclide:  Technetium 83m Sestamibi  Stress Radionuclide Dose: 33.0 mCi           Stress Protocol Rest HR: 66 Stress HR: 139  Rest BP: 155/82 Stress BP: 153/89  Exercise Time (min): 6:00 METS: 7   Predicted Max HR: 153 bpm % Max HR: 90.85 bpm Rate Pressure Product: 21308   Dose of Adenosine (mg):  n/a Dose of Lexiscan: n/a mg  Dose of Atropine (mg): n/a Dose of Dobutamine: n/a mcg/kg/min (at max HR)  Stress Test Technologist: Nelson Chimes, BS-ES  Nuclear Technologist:  Doyne Keel, CNMT     Rest Procedure:  Myocardial perfusion imaging was performed at rest 45 minutes following the intravenous administration of Technetium 46m Sestamibi. Rest ECG: NSR - Normal EKG  Stress Procedure:  The patient exercised on the treadmill utilizing the Bruce Protocol for 6:00 minutes. The  patient stopped due to SOB, fatigue and denied any chest pain.  Technetium 21m Sestamibi was injected at peak exercise and myocardial perfusion imaging was performed after a brief delay. Stress ECG: No significant change from baseline ECG  QPS Raw Data Images:  Soft tissue (diaphragm) underlies the heart.   Stress Images: Thinning with decreased counts in the inferoseptal region (base, mid)  Otherwise normal perfusioin.   Rest Images:  No significant change from the rest images  Subtraction (SDS):  No significant ischemia Transient Ischemic Dilatation (Normal <1.22):  1.03 Lung/Heart Ratio (Normal <0.45):  0.28  Quantitative Gated Spect Images QGS EDV:  47 ml QGS ESV:  10 ml  Impression Exercise Capacity:  Good exercise capacity. BP Response:  Normal blood pressure response. Clinical Symptoms:  No significant symptoms noted. ECG Impression:  No significant ST segment change suggestive of ischemia. Comparison with Prior Nuclear Study: No images to compare  Overall Impression:  Normal stress nuclear study.  LV Ejection Fraction: 78%.  LV Wall Motion:  NL LV Function; NL Wall Motion  Dietrich Pates

## 2013-07-12 ENCOUNTER — Ambulatory Visit (INDEPENDENT_AMBULATORY_CARE_PROVIDER_SITE_OTHER): Payer: Medicare Other | Admitting: Cardiovascular Disease

## 2013-07-12 ENCOUNTER — Encounter: Payer: Self-pay | Admitting: Cardiovascular Disease

## 2013-07-12 VITALS — BP 120/70 | HR 74 | Ht 62.5 in | Wt 116.0 lb

## 2013-07-12 DIAGNOSIS — I2510 Atherosclerotic heart disease of native coronary artery without angina pectoris: Secondary | ICD-10-CM

## 2013-07-12 NOTE — Progress Notes (Signed)
History of Present Illness: 68 yo female with h/o CAD, COPD, GERD, depression, OA admitted to [facility] 12/28/10 with a NSTEMI. Cardiac cath with severe disease LAD. A 2.5 x 20 mm Promus drug eluting stent was placed in the mid LAD on 12/28/10. Staged intervention of mid RCA on 12/30/10 with a 3.0 x 12 mm Promus drug eluting stent was placed in the mid RCA. Echo March 2013 in w/u of dyspnea with normal LV function and no valve issues.   She is here today for follow up. She tells me that she has been feeling well. No exertional chest pain.  She continues to have has SOB when exercising. She has been tolerating all medications. She exercises every day. She goes to the [facility] to exercise. She does have symptoms of burning occasionally after meals. She thinks this is heartburn.   Primary Care Physician: Briscoe Deutscher   Last Lipid Profile:  01/05/13:  Total chol: 140  HDL: 67  LDL: 57  Past Medical History  Diagnosis Date  . Emphysema   . Depression   . Migraine   . TIA (transient ischemic attack) 1995  . Arthritis   . Coronary artery disease August 2012    Drug eluting stent mid LAD, drug eluting stent mid RCA  . Heart attack August 2012    Past Surgical History  Procedure Laterality Date  . Appendectomy    . Tonsillectomy    . Thoracotomy    . Foot surgery    . Nasal septum surgery    . Shoulder surgery    . Hand surgery      Current Outpatient Prescriptions  Medication Sig Dispense Refill  . albuterol (PROVENTIL HFA) 108 (90 BASE) MCG/ACT inhaler Inhale 2 puffs into the lungs every 6 (six) hours as needed.        Marland Kitchen aspirin 81 MG chewable tablet Chew 81 mg by mouth daily.        . CELEBREX 200 MG capsule Take 200 mg by mouth daily.       . Cholecalciferol (VITAMIN D) 2000 UNITS CAPS Take by mouth daily.        . clopidogrel (PLAVIX) 75 MG tablet Take 1 tablet (75 mg total) by mouth daily.  90 tablet  3  . nitroGLYCERIN (NITROSTAT) 0.4 MG SL tablet Place 1 tablet (0.4 mg  total) under the tongue every 5 (five) minutes as needed for chest pain.  25 tablet  6  . rosuvastatin (CRESTOR) 10 MG tablet Take 1 tablet (10 mg total) by mouth at bedtime.  90 tablet  3  . sertraline (ZOLOFT) 50 MG tablet Take by mouth daily.        Marland Kitchen tiotropium (SPIRIVA) 18 MCG inhalation capsule Place 1 capsule (18 mcg total) into inhaler and inhale daily.  90 capsule  3  . topiramate (TOPAMAX) 100 MG tablet Take by mouth daily.         No current facility-administered medications for this visit.    Allergies  Allergen Reactions  . Naproxen Hives  . Tramadol Hcl Nausea And Vomiting    History   Social History  . Marital Status: Married    Spouse Name: N/A    Number of Children: N/A  . Years of Education: N/A   Occupational History  . Not on file.   Social History Main Topics  . Smoking status: Former Smoker -- 1.00 packs/day for 10 years    Types: Cigarettes    Quit date:  05/06/1971  . Smokeless tobacco: Never Used  . Alcohol Use: No  . Drug Use: No  . Sexual Activity: Not on file   Other Topics Concern  . Not on file   Social History Narrative  . No narrative on file    Family History  Problem Relation Age of Onset  . Heart disease Paternal Grandmother   . Heart disease Paternal Grandfather   . Rheum arthritis Maternal Grandmother   . Rheum arthritis Maternal Grandfather   . Lung cancer Maternal Uncle   . Diabetes Mother   . Diabetes Maternal Aunt   . Diabetes Maternal Grandmother     Review of Systems:  As stated in the HPI and otherwise negative.   BP 120/70  Pulse 74  Ht 5' 2.5" (1.588 m)  Wt 116 lb (52.617 kg)  BMI 20.87 kg/m2  Physical Examination: General: Well developed, well nourished, NAD HEENT: OP clear, mucus membranes moist SKIN: warm, dry. No rashes. Neuro: No focal deficits Musculoskeletal: Muscle strength 5/5 all ext Psychiatric: Mood and affect normal Neck: No JVD, no carotid bruits, no thyromegaly, no  lymphadenopathy. Lungs:Clear bilaterally, no wheezes, rhonci, crackles Cardiovascular: Regular rate and rhythm. No murmurs, gallops or rubs. Abdomen:Soft. Bowel sounds present. Non-tender.  Extremities: No lower extremity edema. Pulses are 2 + in the bilateral DP/PT.  Echo March 2013:  Left ventricle: LVEF is approximately 55 th 60% with mild inferoseptal hypokinesis. False tendon at LV apex. The cavity size was normal. Wall thickness was normal. Systolic function was normal. The estimated ejection fraction was in the range of 55% to 60%. Doppler parameters are consistent with abnormal left ventricular relaxation (grade 1 diastolic Dysfunction).  Stress myoview 02/25/13: Stress Procedure: The patient exercised on the treadmill utilizing the Bruce Protocol for 6:00 minutes. The patient stopped due to SOB, fatigue and denied any chest pain. Technetium 96m Sestamibi was injected at peak exercise and myocardial perfusion imaging was performed after a brief delay.  Stress ECG: No significant change from baseline ECG  QPS  Raw Data Images: Soft tissue (diaphragm) underlies the heart.  Stress Images: Thinning with decreased counts in the inferoseptal region (base, mid) Otherwise normal perfusioin.  Rest Images: No significant change from the rest images  Subtraction (SDS): No significant ischemia  Transient Ischemic Dilatation (Normal <1.22): 1.03  Lung/Heart Ratio (Normal <0.45): 0.28  Quantitative Gated Spect Images  QGS EDV: 47 ml  QGS ESV: 10 ml  Impression  Exercise Capacity: Good exercise capacity.  BP Response: Normal blood pressure response.  Clinical Symptoms: No significant symptoms noted.  ECG Impression: No significant ST segment change suggestive of ischemia.  Comparison with Prior Nuclear Study: No images to compare  Overall Impression: Normal stress nuclear study.  LV Ejection Fraction: 78%. LV Wall Motion: NL LV Function; NL Wall Motion  Assessment and Plan:   1. CAD:   Stable. Stress test without ischemia October 2014. Will continue ASA/Plavix. I suspect that she has dyspnea from small vessel CAD. No angina. She has not been on a beta blocker secondary to hypotension and she does not wish to start one. She is on a statin and lipids are at goal. Her BP is at goal.  Continue heart healthy diet and daily exercise. She will try Pepcid for possible GERD.

## 2013-07-12 NOTE — Patient Instructions (Signed)
Your physician wants you to follow-up in:  6 months. You will receive a reminder letter in the mail two months in advance. If you don't receive a letter, please call our office to schedule the follow-up appointment.   

## 2014-01-13 ENCOUNTER — Encounter: Payer: Self-pay | Admitting: Cardiovascular Disease

## 2014-01-31 ENCOUNTER — Ambulatory Visit (INDEPENDENT_AMBULATORY_CARE_PROVIDER_SITE_OTHER): Payer: Medicare Other | Admitting: Cardiovascular Disease

## 2014-01-31 ENCOUNTER — Encounter: Payer: Self-pay | Admitting: Cardiovascular Disease

## 2014-01-31 VITALS — BP 122/80 | HR 71 | Ht 62.5 in | Wt 117.0 lb

## 2014-01-31 DIAGNOSIS — I2510 Atherosclerotic heart disease of native coronary artery without angina pectoris: Secondary | ICD-10-CM

## 2014-01-31 MED ORDER — CLOPIDOGREL BISULFATE 75 MG PO TABS: 75.0000 mg | ORAL_TABLET | Freq: Every day | ORAL | Status: DC

## 2014-01-31 NOTE — Progress Notes (Signed)
History of Present Illness: 68 yo female with h/o CAD, COPD, GERD, depression, OA admitted to [facility] 12/28/10 with a NSTEMI. Cardiac cath with severe disease LAD. A 2.5 x 20 mm Promus drug eluting stent was placed in the mid LAD on 12/28/10. Staged intervention of mid RCA on 12/30/10 with a 3.0 x 12 mm Promus drug eluting stent was placed in the mid RCA. Echo March 2013 in w/u of dyspnea with normal LV function and no valve issues.   She is here today for follow up. She tells me that she has been feeling well. No exertional chest pain.  She continues to have has SOB when exercising. She has been tolerating all medications.    Primary Care Physician: Briscoe Deutscher   Last Lipid Profile:    Past Medical History  Diagnosis Date  . Emphysema   . Depression   . Migraine   . TIA (transient ischemic attack) 1995  . Arthritis   . Coronary artery disease August 2012    Drug eluting stent mid LAD, drug eluting stent mid RCA  . Heart attack August 2012    Past Surgical History  Procedure Laterality Date  . Appendectomy    . Tonsillectomy    . Thoracotomy    . Foot surgery    . Nasal septum surgery    . Shoulder surgery    . Hand surgery      Current Outpatient Prescriptions  Medication Sig Dispense Refill  . albuterol (PROVENTIL HFA) 108 (90 BASE) MCG/ACT inhaler Inhale 2 puffs into the lungs every 6 (six) hours as needed.        Marland Kitchen aspirin 81 MG chewable tablet Chew 81 mg by mouth daily.        . CELEBREX 200 MG capsule Take 200 mg by mouth daily.       . Cholecalciferol (VITAMIN D) 2000 UNITS CAPS Take by mouth daily.        . clopidogrel (PLAVIX) 75 MG tablet Take 1 tablet (75 mg total) by mouth daily.  90 tablet  3  . sertraline (ZOLOFT) 50 MG tablet Take by mouth daily.        Marland Kitchen tiotropium (SPIRIVA) 18 MCG inhalation capsule Place 1 capsule (18 mcg total) into inhaler and inhale daily.  90 capsule  3  . topiramate (TOPAMAX) 100 MG tablet Take by mouth daily.        .  nitroGLYCERIN (NITROSTAT) 0.4 MG SL tablet Place 1 tablet (0.4 mg total) under the tongue every 5 (five) minutes as needed for chest pain.  25 tablet  6  . rosuvastatin (CRESTOR) 10 MG tablet Take 1 tablet (10 mg total) by mouth at bedtime.  90 tablet  3   No current facility-administered medications for this visit.    Allergies  Allergen Reactions  . Naproxen Hives  . Tramadol Hcl Nausea And Vomiting    History   Social History  . Marital Status: Married    Spouse Name: N/A    Number of Children: N/A  . Years of Education: N/A   Occupational History  . Not on file.   Social History Main Topics  . Smoking status: Former Smoker -- 1.00 packs/day for 10 years    Types: Cigarettes    Quit date: 05/06/1971  . Smokeless tobacco: Never Used  . Alcohol Use: No  . Drug Use: No  . Sexual Activity: Not on file   Other Topics Concern  . Not on file  Social History Narrative  . No narrative on file    Family History  Problem Relation Age of Onset  . Heart disease Paternal Grandmother   . Heart disease Paternal Grandfather   . Rheum arthritis Maternal Grandmother   . Rheum arthritis Maternal Grandfather   . Lung cancer Maternal Uncle   . Diabetes Mother   . Diabetes Maternal Aunt   . Diabetes Maternal Grandmother     Review of Systems:  As stated in the HPI and otherwise negative.   BP 122/80  Pulse 71  Ht 5' 2.5" (1.588 m)  Wt 117 lb (53.071 kg)  BMI 21.05 kg/m2  Physical Examination: General: Well developed, well nourished, NAD HEENT: OP clear, mucus membranes moist SKIN: warm, dry. No rashes. Neuro: No focal deficits Musculoskeletal: Muscle strength 5/5 all ext Psychiatric: Mood and affect normal Neck: No JVD, no carotid bruits, no thyromegaly, no lymphadenopathy. Lungs:Clear bilaterally, no wheezes, rhonci, crackles Cardiovascular: Regular rate and rhythm. No murmurs, gallops or rubs. Abdomen:Soft. Bowel sounds present. Non-tender.  Extremities: No lower  extremity edema. Pulses are 2 + in the bilateral DP/PT.  Echo March 2013:  Left ventricle: LVEF is approximately 55 th 60% with mild inferoseptal hypokinesis. False tendon at LV apex. The cavity size was normal. Wall thickness was normal. Systolic function was normal. The estimated ejection fraction was in the range of 55% to 60%. Doppler parameters are consistent with abnormal left ventricular relaxation (grade 1 diastolic Dysfunction).  Stress myoview 02/25/13: Stress Procedure: The patient exercised on the treadmill utilizing the Bruce Protocol for 6:00 minutes. The patient stopped due to SOB, fatigue and denied any chest pain. Technetium 42m Sestamibi was injected at peak exercise and myocardial perfusion imaging was performed after a brief delay.  Stress ECG: No significant change from baseline ECG  QPS  Raw Data Images: Soft tissue (diaphragm) underlies the heart.  Stress Images: Thinning with decreased counts in the inferoseptal region (base, mid) Otherwise normal perfusioin.  Rest Images: No significant change from the rest images  Subtraction (SDS): No significant ischemia  Transient Ischemic Dilatation (Normal <1.22): 1.03  Lung/Heart Ratio (Normal <0.45): 0.28  Quantitative Gated Spect Images  QGS EDV: 47 ml  QGS ESV: 10 ml  Impression  Exercise Capacity: Good exercise capacity.  BP Response: Normal blood pressure response.  Clinical Symptoms: No significant symptoms noted.  ECG Impression: No significant ST segment change suggestive of ischemia.  Comparison with Prior Nuclear Study: No images to compare  Overall Impression: Normal stress nuclear study.  LV Ejection Fraction: 78%. LV Wall Motion: NL LV Function; NL Wall Motion  EKG: NSR, rate 71 bpm.   Assessment and Plan:   1. CAD:  Stable. Stress test without ischemia October 2014. Will continue ASA/Plavix. I suspect that she has dyspnea from small vessel CAD. No angina. She has not been on a beta blocker secondary  to hypotension and she does not wish to start one. She is on a statin and lipids are at goal. Her BP is at goal.  Continue heart healthy diet and daily exercise.

## 2014-01-31 NOTE — Patient Instructions (Signed)
Your physician wants you to follow-up in:  6 months. You will receive a reminder letter in the mail two months in advance. If you don't receive a letter, please call our office to schedule the follow-up appointment.   

## 2014-03-09 ENCOUNTER — Ambulatory Visit (INDEPENDENT_AMBULATORY_CARE_PROVIDER_SITE_OTHER): Payer: Medicare Other | Admitting: Emergency Medicine

## 2014-03-09 ENCOUNTER — Encounter: Payer: Self-pay | Admitting: Emergency Medicine

## 2014-03-09 VITALS — BP 118/74 | HR 77 | Ht 62.0 in | Wt 122.8 lb

## 2014-03-09 DIAGNOSIS — J449 Chronic obstructive pulmonary disease, unspecified: Secondary | ICD-10-CM

## 2014-03-09 MED ORDER — ALBUTEROL SULFATE HFA 108 (90 BASE) MCG/ACT IN AERS: 2.0000 | INHALATION_SPRAY | Freq: Four times a day (QID) | RESPIRATORY_TRACT | Status: DC | PRN

## 2014-03-09 NOTE — Patient Instructions (Signed)
We will try stopping Spiriva to see if you lose ground off of it. You may find that your cough improves without it If you do miss the spiriva, call our office. We will try the other delivery system of this medication to see if you prefer it  Keep albuterol available to use as needed.  Follow with Dr Lamonte Sakai in 1 month

## 2014-03-09 NOTE — Addendum Note (Signed)
Addended by: Mathis Dad on: 03/09/2014 02:20 PM   Modules accepted: Orders

## 2014-03-09 NOTE — Assessment & Plan Note (Signed)
We will try stopping Spiriva to see if you lose ground off of it. You may find that your cough improves without it If you do miss the spiriva, call our office. We will try the other delivery system of this medication to see if you prefer it  Keep albuterol available to use as needed.  Follow with Dr Lamonte Sakai in 1 month

## 2014-03-09 NOTE — Progress Notes (Signed)
Subjective:    Patient ID: [name], female    DOB: 1945-11-26, 68 y.o.   MRN: [id number]  HPI Comments: [name] is a 68 year old woman with mild COPD and hyperinflation per her pulmonary function testing.  Also with chronic cough, has been treated for GERD and with loratadine/nasal steroid. The GERD therapy did not seem to change the cough, but she may have benefitted from the allergy rx.     ROS: Still with daily cough - paroxysms. Non-productive. Has been rx for allergies before - immunotherapy.  No GERD sx.     ROV 04/10/09 -- has been doing well for the last yr on Spiriva until about 3 -4 weeks ago. At that time she developed R sided CP that feels "deep", worse with inspiration. Some associated SOB, also with more cough. She has been having persistant dry cough, was better for a while without intervention (no longer on an allergy regimen).     ROV 04/25/09 -- returns for f/u cough. CT scan of the chest reassuring, no evidence PE. Still with dry cough. Has been treated for GERD and allergies in the past without great change in the cough.     ROV 04/15/10 -- F/u for her chronic cough, mild COPD. She still has daily cough, cycles of 3-7 days o fterrible paroxysmal cough, then back to her usual cough pattern. Has been treated in past for both GERD and allergies. Taking Spiriva in the am, never needs ProAir, breathing is stable. Not currently on loratadine/nasal steroid. No PND, no GERD signs.  >>referred to ENT w/ nml laryngoscope, tx w/ PPI and neurontin-unable to tolerate neurontin.   07/30/2010 Acute OV- Presents for an acute office visit. Complains of 68 weeks of right sided chest pain that occurs when she takes deep breath or coughs. Mild in nature but has not resolved. NO meds used. She has hx of chronic cough - it is at baseline. Cough is dry w/ no discolored mucus. NO exertional chest pain, syncope, palpitations, recent travel or calf swelling/pain. Nontender to touch, and does not  reproduce with movement except deep inspiration. Last mammogram 04/2010 nml per pt.   ROV 04/08/11 -- hx mild COPD, chronic cough with GERD and PND. Was seen by Dr Redmond Baseman for ENT exam 05/2010 - normal pharynx. Since last visit had acute MI, s/p PTCI, following w Dr Glennie Hawk. Attending cardiac rehab. Breathing fairly well. Taking Spiriva. Cough is stable. Very rare rescue inhaler use. She had flu shot and pneumovax this year. No exacerbations since last visit.   ROV 11/17/11 -- hx mild COPD, chronic cough with GERD and PND. Since last time she had done ok, then about 3 weeks ago some increased mucous and cough. Was rarely productive. May be some better, but still not back down to baseline. She is still taking her spiriva.   ROV 05/19/12 -- hx mild COPD, chronic cough with GERD and PND. Regular f/u. Her cough is stable, not really worse or better. Likely due to her grass and tree exposure. Occasional exertional SOB. Remains on Spiriva + SABA prn.   ROV 03/09/14 -- follow up visit for COPD and allergies. She has been treated for reflux before without effect.  She is on spiriva, has been doing well on it. She still coughs every day. No flares. Flu shot up to date.    Objective:   Physical Exam Filed Vitals:   03/09/14 1353  BP: 118/74  Pulse: 77   Gen: Pleasant, well-nourished, in no distress,  normal affect  ENT: No lesions,  mouth clear,  oropharynx clear, no postnasal drip  Neck: No JVD, no TMG, no carotid bruits  Lungs: No use of accessory muscles, clear without rales or rhonchi  Chest wall nontender - no eccymosis noted.   No axillary adenopathy.   Cardiovascular: RRR, heart sounds normal, no murmur or gallops, no peripheral edema  Musculoskeletal: No deformities, no cyanosis or clubbing Cervical rom nm w/ no radicular symptoms   Neuro: alert, non focal  Skin: Warm, no lesions or rashes     Assessment & Plan:  COPD (chronic obstructive pulmonary disease) We will try stopping  Spiriva to see if you lose ground off of it. You may find that your cough improves without it If you do miss the spiriva, call our office. We will try the other delivery system of this medication to see if you prefer it  Keep albuterol available to use as needed.  Follow with Dr Lamonte Sakai in 1 month

## 2014-03-20 ENCOUNTER — Telehealth: Payer: Self-pay | Admitting: Emergency Medicine

## 2014-03-20 MED ORDER — ALBUTEROL SULFATE HFA 108 (90 BASE) MCG/ACT IN AERS: 2.0000 | INHALATION_SPRAY | RESPIRATORY_TRACT | Status: DC | PRN

## 2014-03-20 NOTE — Telephone Encounter (Signed)
RX sent for ProAir.  Discussed with patient at last OV that if Proventil was not covered, we would send in ProAir.  Nothing further needed at this time.

## 2014-07-06 ENCOUNTER — Ambulatory Visit (INDEPENDENT_AMBULATORY_CARE_PROVIDER_SITE_OTHER): Payer: Medicare Other | Admitting: Cardiovascular Disease

## 2014-07-06 ENCOUNTER — Encounter: Payer: Self-pay | Admitting: Cardiovascular Disease

## 2014-07-06 VITALS — BP 110/62 | HR 73 | Ht 62.5 in | Wt 117.0 lb

## 2014-07-06 DIAGNOSIS — I2510 Atherosclerotic heart disease of native coronary artery without angina pectoris: Secondary | ICD-10-CM

## 2014-07-06 NOTE — Progress Notes (Signed)
History of Present Illness: 69 yo female with h/o CAD, COPD, GERD, depression, OA admitted to [facility] 12/28/10 with a NSTEMI. Cardiac cath with severe disease LAD. A 2.5 x 20 mm Promus drug eluting stent was placed in the mid LAD on 12/28/10. Staged intervention of mid RCA on 12/30/10 with a 3.0 x 12 mm Promus drug eluting stent was placed in the mid RCA. Echo March 2013 in w/u of dyspnea with normal LV function and no valve issues. Stress myoview October 2014 without ischemia.   She is here today for follow up. She tells me that she has been feeling well. No exertional chest pain.  She continues to have has SOB when exercising. She has been tolerating all medications.    Primary Care Physician: Briscoe Deutscher   Last Lipid Profile: Followed in primary care.  Past Medical History  Diagnosis Date  . Emphysema   . Depression   . Migraine   . TIA (transient ischemic attack) 1995  . Arthritis   . Coronary artery disease August 2012    Drug eluting stent mid LAD, drug eluting stent mid RCA  . Heart attack August 2012    Past Surgical History  Procedure Laterality Date  . Appendectomy    . Tonsillectomy    . Thoracotomy    . Foot surgery    . Nasal septum surgery    . Shoulder surgery    . Hand surgery      Current Outpatient Prescriptions  Medication Sig Dispense Refill  . albuterol (PROAIR HFA) 108 (90 BASE) MCG/ACT inhaler Inhale 2 puffs into the lungs every 4 (four) hours as needed for wheezing or shortness of breath. 3 Inhaler 3  . aspirin 81 MG chewable tablet Chew 81 mg by mouth daily.      . CELEBREX 200 MG capsule Take 200 mg by mouth daily.     . Cholecalciferol (VITAMIN D) 2000 UNITS CAPS Take by mouth daily.      . clopidogrel (PLAVIX) 75 MG tablet Take 1 tablet (75 mg total) by mouth daily. 90 tablet 3  . nitroGLYCERIN (NITROSTAT) 0.4 MG SL tablet Place 1 tablet (0.4 mg total) under the tongue every 5 (five) minutes as needed for chest pain. 25 tablet 6  .  rosuvastatin (CRESTOR) 10 MG tablet Take 1 tablet (10 mg total) by mouth at bedtime. 90 tablet 3  . sertraline (ZOLOFT) 50 MG tablet Take by mouth daily.      Marland Kitchen topiramate (TOPAMAX) 100 MG tablet Take by mouth daily.       No current facility-administered medications for this visit.    Allergies  Allergen Reactions  . Naproxen Hives  . Tramadol Hcl Nausea And Vomiting    History   Social History  . Marital Status: Married    Spouse Name: N/A  . Number of Children: N/A  . Years of Education: N/A   Occupational History  . Not on file.   Social History Main Topics  . Smoking status: Former Smoker -- 1.00 packs/day for 10 years    Types: Cigarettes    Quit date: 05/06/1971  . Smokeless tobacco: Never Used  . Alcohol Use: No  . Drug Use: No  . Sexual Activity: Not on file   Other Topics Concern  . Not on file   Social History Narrative    Family History  Problem Relation Age of Onset  . Heart disease Paternal Grandmother   . Heart disease Paternal Grandfather   .  Rheum arthritis Maternal Grandmother   . Rheum arthritis Maternal Grandfather   . Lung cancer Maternal Uncle   . Diabetes Mother   . Diabetes Maternal Aunt   . Diabetes Maternal Grandmother     Review of Systems:  As stated in the HPI and otherwise negative.   BP 110/62 mmHg  Pulse 73  Ht 5' 2.5" (1.588 m)  Wt 117 lb (53.071 kg)  BMI 21.05 kg/m2  SpO2 93%  Physical Examination: General: Well developed, well nourished, NAD HEENT: OP clear, mucus membranes moist SKIN: warm, dry. No rashes. Neuro: No focal deficits Musculoskeletal: Muscle strength 5/5 all ext Psychiatric: Mood and affect normal Neck: No JVD, no carotid bruits, no thyromegaly, no lymphadenopathy. Lungs:Clear bilaterally, no wheezes, rhonci, crackles Cardiovascular: Regular rate and rhythm. No murmurs, gallops or rubs. Abdomen:Soft. Bowel sounds present. Non-tender.  Extremities: No lower extremity edema. Pulses are 2 + in the  bilateral DP/PT.  Echo March 2013:  Left ventricle: LVEF is approximately 55 th 60% with mild inferoseptal hypokinesis. False tendon at LV apex. The cavity size was normal. Wall thickness was normal. Systolic function was normal. The estimated ejection fraction was in the range of 55% to 60%. Doppler parameters are consistent with abnormal left ventricular relaxation (grade 1 diastolic Dysfunction).  Stress myoview 02/25/13: Stress Procedure: The patient exercised on the treadmill utilizing the Bruce Protocol for 6:00 minutes. The patient stopped due to SOB, fatigue and denied any chest pain. Technetium 43m Sestamibi was injected at peak exercise and myocardial perfusion imaging was performed after a brief delay.  Stress ECG: No significant change from baseline ECG  QPS  Raw Data Images: Soft tissue (diaphragm) underlies the heart.  Stress Images: Thinning with decreased counts in the inferoseptal region (base, mid) Otherwise normal perfusioin.  Rest Images: No significant change from the rest images  Subtraction (SDS): No significant ischemia  Transient Ischemic Dilatation (Normal <1.22): 1.03  Lung/Heart Ratio (Normal <0.45): 0.28  Quantitative Gated Spect Images  QGS EDV: 47 ml  QGS ESV: 10 ml  Impression  Exercise Capacity: Good exercise capacity.  BP Response: Normal blood pressure response.  Clinical Symptoms: No significant symptoms noted.  ECG Impression: No significant ST segment change suggestive of ischemia.  Comparison with Prior Nuclear Study: No images to compare  Overall Impression: Normal stress nuclear study.  LV Ejection Fraction: 78%. LV Wall Motion: NL LV Function; NL Wall Motion   Assessment and Plan:   1. CAD:  Stable. Stress test without ischemia October 2014. Will continue ASA/Plavix. No angina. She has not been on a beta blocker secondary to hypotension and she does not wish to start one. She is on a statin and lipids are at goal. Her BP is at goal.   Continue heart healthy diet and daily exercise.

## 2014-07-06 NOTE — Patient Instructions (Signed)
Your physician wants you to follow-up in:  6 months. You will receive a reminder letter in the mail two months in advance. If you don't receive a letter, please call our office to schedule the follow-up appointment.   

## 2014-08-07 ENCOUNTER — Other Ambulatory Visit: Payer: Self-pay | Admitting: Dermatology

## 2015-01-11 ENCOUNTER — Encounter: Payer: Self-pay | Admitting: Cardiovascular Disease

## 2015-02-16 ENCOUNTER — Encounter: Payer: Self-pay | Admitting: Cardiovascular Disease

## 2015-02-16 ENCOUNTER — Ambulatory Visit (INDEPENDENT_AMBULATORY_CARE_PROVIDER_SITE_OTHER): Payer: Medicare Other | Admitting: Cardiovascular Disease

## 2015-02-16 VITALS — BP 110/62 | HR 67 | Wt 115.1 lb

## 2015-02-16 DIAGNOSIS — I2510 Atherosclerotic heart disease of native coronary artery without angina pectoris: Secondary | ICD-10-CM

## 2015-02-16 MED ORDER — CLOPIDOGREL BISULFATE 75 MG PO TABS: 75.0000 mg | ORAL_TABLET | Freq: Every day | ORAL | Status: DC

## 2015-02-16 NOTE — Patient Instructions (Addendum)
Medication Instructions:  Your physician recommends that you continue on your current medications as directed. Please refer to the Current Medication list given to you today.   Labwork: none  Testing/Procedures: none  Follow-Up: Your physician wants you to follow-up in:  12 months.  You will receive a reminder letter in the mail two months in advance. If you don't receive a letter, please call our office to schedule the follow-up appointment.        

## 2015-02-16 NOTE — Progress Notes (Signed)
Chief Complaint  Patient presents with  . Follow-up    CAD    History of Present Illness: 69 yo female with h/o CAD, COPD, GERD, depression, OA admitted to [facility] 12/28/10 with a NSTEMI. Cardiac cath with severe disease LAD. A 2.5 x 20 mm Promus drug eluting stent was placed in the mid LAD on 12/28/10. Staged intervention of mid RCA on 12/30/10 with a 3.0 x 12 mm Promus drug eluting stent was placed in the mid RCA. Echo March 2013 in w/u of dyspnea with normal LV function and no valve issues. Stress myoview October 2014 without ischemia.   She is here today for follow up. She tells me that she has been feeling well. No exertional chest pain. Baseline SOB. She has been tolerating all medications. She exercises several times per week.   Primary Care Physician: Briscoe Deutscher   Last Lipid Profile: Followed in primary care.  Past Medical History  Diagnosis Date  . Emphysema   . Depression   . Migraine   . TIA (transient ischemic attack) 1995  . Arthritis   . Coronary artery disease August 2012    Drug eluting stent mid LAD, drug eluting stent mid RCA  . Heart attack [facility]) August 2012    Past Surgical History  Procedure Laterality Date  . Appendectomy    . Tonsillectomy    . Thoracotomy    . Foot surgery    . Nasal septum surgery    . Shoulder surgery    . Hand surgery      Current Outpatient Prescriptions  Medication Sig Dispense Refill  . albuterol (PROAIR HFA) 108 (90 BASE) MCG/ACT inhaler Inhale 2 puffs into the lungs every 4 (four) hours as needed for wheezing or shortness of breath. 3 Inhaler 3  . aspirin 81 MG chewable tablet Chew 81 mg by mouth daily.      . Cholecalciferol (VITAMIN D) 2000 UNITS CAPS Take by mouth daily.      . clopidogrel (PLAVIX) 75 MG tablet Take 1 tablet (75 mg total) by mouth daily. 90 tablet 3  . rosuvastatin (CRESTOR) 10 MG tablet Take 10 mg by mouth daily.  3  . sertraline (ZOLOFT) 50 MG tablet Take by mouth daily.      Marland Kitchen  topiramate (TOPAMAX) 100 MG tablet Take by mouth daily.      . nitroGLYCERIN (NITROSTAT) 0.4 MG SL tablet Place 1 tablet (0.4 mg total) under the tongue every 5 (five) minutes as needed for chest pain. 25 tablet 6  . rosuvastatin (CRESTOR) 10 MG tablet Take 1 tablet (10 mg total) by mouth at bedtime. 90 tablet 3   No current facility-administered medications for this visit.    Allergies  Allergen Reactions  . Naproxen Hives  . Tramadol Hcl Nausea And Vomiting    Social History   Social History  . Marital Status: Married    Spouse Name: N/A  . Number of Children: N/A  . Years of Education: N/A   Occupational History  . Not on file.   Social History Main Topics  . Smoking status: Former Smoker -- 1.00 packs/day for 10 years    Types: Cigarettes    Quit date: 05/06/1971  . Smokeless tobacco: Never Used  . Alcohol Use: No  . Drug Use: No  . Sexual Activity: Not on file   Other Topics Concern  . Not on file   Social History Narrative    Family History  Problem Relation Age of  Onset  . Heart disease Paternal Grandmother   . Heart disease Paternal Grandfather   . Rheum arthritis Maternal Grandmother   . Rheum arthritis Maternal Grandfather   . Lung cancer Maternal Uncle   . Diabetes Mother   . Diabetes Maternal Aunt   . Diabetes Maternal Grandmother     Review of Systems:  As stated in the HPI and otherwise negative.   BP 110/62 mmHg  Pulse 67  Wt 115 lb 1.9 oz (52.218 kg)  SpO2 92%  Physical Examination: General: Well developed, well nourished, NAD HEENT: OP clear, mucus membranes moist SKIN: warm, dry. No rashes. Neuro: No focal deficits Musculoskeletal: Muscle strength 5/5 all ext Psychiatric: Mood and affect normal Neck: No JVD, no carotid bruits, no thyromegaly, no lymphadenopathy. Lungs:Clear bilaterally, no wheezes, rhonci, crackles Cardiovascular: Regular rate and rhythm. No murmurs, gallops or rubs. Abdomen:Soft. Bowel sounds present. Non-tender.    Extremities: No lower extremity edema. Pulses are 2 + in the bilateral DP/PT.  Echo March 2013:  Left ventricle: LVEF is approximately 55 th 60% with mild inferoseptal hypokinesis. False tendon at LV apex. The cavity size was normal. Wall thickness was normal. Systolic function was normal. The estimated ejection fraction was in the range of 55% to 60%. Doppler parameters are consistent with abnormal left ventricular relaxation (grade 1 diastolic Dysfunction).  Stress myoview 02/25/13: Stress Procedure: The patient exercised on the treadmill utilizing the Bruce Protocol for 6:00 minutes. The patient stopped due to SOB, fatigue and denied any chest pain. Technetium 52m Sestamibi was injected at peak exercise and myocardial perfusion imaging was performed after a brief delay.  Stress ECG: No significant change from baseline ECG  QPS  Raw Data Images: Soft tissue (diaphragm) underlies the heart.  Stress Images: Thinning with decreased counts in the inferoseptal region (base, mid) Otherwise normal perfusioin.  Rest Images: No significant change from the rest images  Subtraction (SDS): No significant ischemia  Transient Ischemic Dilatation (Normal <1.22): 1.03  Lung/Heart Ratio (Normal <0.45): 0.28  Quantitative Gated Spect Images  QGS EDV: 47 ml  QGS ESV: 10 ml  Impression  Exercise Capacity: Good exercise capacity.  BP Response: Normal blood pressure response.  Clinical Symptoms: No significant symptoms noted.  ECG Impression: No significant ST segment change suggestive of ischemia.  Comparison with Prior Nuclear Study: No images to compare  Overall Impression: Normal stress nuclear study.  LV Ejection Fraction: 78%. LV Wall Motion: NL LV Function; NL Wall Motion  EKG:  EKG is ordered today. The ekg ordered today demonstrates NSR, rate 67 bpm. Non-specific T wave abn, unchanged.   Recent Labs: No results found for requested labs within last 365 days.   Lipid Panel Primary care:  HDL 76, LDL 60 Sept 2016   Wt Readings from Last 3 Encounters:  02/16/15 115 lb 1.9 oz (52.218 kg)  07/06/14 117 lb (53.071 kg)  03/09/14 122 lb 12.8 oz (55.702 kg)     Other studies Reviewed: Additional studies/ records that were reviewed today include: . Review of the above records demonstrates:    Assessment and Plan:   1. CAD:  Stable. No angina. Stress test without ischemia October 2014. Will continue ASA/Plavix. She has not been on a beta blocker secondary to hypotension and she does not wish to start one. She is on a statin and lipids are at goal in primary care (LDL 60 sept 2016). Her BP is at goal.  Continue heart healthy diet and daily exercise.   Current medicines are  reviewed at length with the patient today.  The patient does not have concerns regarding medicines.  The following changes have been made:  no change  Labs/ tests ordered today include:  No orders of the defined types were placed in this encounter.    Disposition:   FU with me in 12  months  Signed, Lauree Chandler, MD 02/16/2015 8:57 AM    [facility] [address], [place], [place]  [address] Phone: [phone]; Fax: [phone]

## 2015-03-22 ENCOUNTER — Ambulatory Visit (INDEPENDENT_AMBULATORY_CARE_PROVIDER_SITE_OTHER): Payer: Medicare Other | Admitting: Emergency Medicine

## 2015-03-22 ENCOUNTER — Ambulatory Visit (INDEPENDENT_AMBULATORY_CARE_PROVIDER_SITE_OTHER)
Admission: RE | Admit: 2015-03-22 | Discharge: 2015-03-22 | Disposition: A | Discharge status: Home / Self Care | Payer: Medicare Other | Source: Ambulatory Visit | Attending: Emergency Medicine | Admitting: Emergency Medicine

## 2015-03-22 ENCOUNTER — Encounter: Payer: Self-pay | Admitting: Emergency Medicine

## 2015-03-22 VITALS — BP 126/76 | HR 79 | Ht 62.5 in | Wt 110.0 lb

## 2015-03-22 DIAGNOSIS — R05 Cough: Secondary | ICD-10-CM | POA: Diagnosis not present

## 2015-03-22 DIAGNOSIS — J449 Chronic obstructive pulmonary disease, unspecified: Secondary | ICD-10-CM | POA: Diagnosis not present

## 2015-03-22 DIAGNOSIS — R059 Cough, unspecified: Secondary | ICD-10-CM

## 2015-03-22 MED ORDER — ALBUTEROL SULFATE HFA 108 (90 BASE) MCG/ACT IN AERS: 2.0000 | INHALATION_SPRAY | RESPIRATORY_TRACT | Status: DC | PRN

## 2015-03-22 NOTE — Assessment & Plan Note (Signed)
She stopped her Spiriva. May have some slight increase in her exertional dyspnea although she is not convinced that the cost benefit ratio favors restarting the medication. She does have albuterol available that she uses primarily for paroxysmal cough. At this time we will hold off on scheduled bronchodilators, reconsider depending on her symptoms

## 2015-03-22 NOTE — Assessment & Plan Note (Signed)
Not currently on a GERD regimen or an allergy regimen. Her cough has continued despite GERD therapy. Her anatomy is normal based on ENT evaluation in 2012. I'll check a chest x-ray today and try Tessalon Perles when necessary

## 2015-03-22 NOTE — Patient Instructions (Addendum)
We will not restart any scheduled inhaled medication at this time. We will reconsider if your symptoms change.  Try using tessalon perles up to every 6 hours if needed for cough CXR today  Follow with Dr Lamonte Sakai in 12 months or sooner if you have any problems

## 2015-03-22 NOTE — Progress Notes (Signed)
Subjective:    Patient ID: [name], female    DOB: [birth year], 69 y.o.   MRN: [id number]  HPI Comments: Ms. Bohlken is a 69 year old woman with mild COPD and hyperinflation per her pulmonary function testing.  Also with chronic cough, has been treated for GERD and with loratadine/nasal steroid. The GERD therapy did not seem to change the cough, but she may have benefitted from the allergy rx.     ROS: Still with daily cough - paroxysms. Non-productive. Has been rx for allergies before - immunotherapy.  No GERD sx.     ROV 04/10/09 -- has been doing well for the last yr on Spiriva until about 3 -4 weeks ago. At that time she developed R sided CP that feels "deep", worse with inspiration. Some associated SOB, also with more cough. She has been having persistant dry cough, was better for a while without intervention (no longer on an allergy regimen).     ROV 04/25/09 -- returns for f/u cough. CT scan of the chest reassuring, no evidence PE. Still with dry cough. Has been treated for GERD and allergies in the past without great change in the cough.     ROV 04/15/10 -- F/u for her chronic cough, mild COPD. She still has daily cough, cycles of 3-7 days o fterrible paroxysmal cough, then back to her usual cough pattern. Has been treated in past for both GERD and allergies. Taking Spiriva in the am, never needs ProAir, breathing is stable. Not currently on loratadine/nasal steroid. No PND, no GERD signs.  >>referred to ENT w/ nml laryngoscope, tx w/ PPI and neurontin-unable to tolerate neurontin.   07/30/2010 Acute OV- Presents for an acute office visit. Complains of 4 weeks of right sided chest pain that occurs when she takes deep breath or coughs. Mild in nature but has not resolved. NO meds used. She has hx of chronic cough - it is at baseline. Cough is dry w/ no discolored mucus. NO exertional chest pain, syncope, palpitations, recent travel or calf swelling/pain. Nontender to touch, and does not  reproduce with movement except deep inspiration. Last mammogram 04/2010 nml per pt.   ROV 04/08/11 -- hx mild COPD, chronic cough with GERD and PND. Was seen by Dr Redmond Baseman for ENT exam 05/2010 - normal pharynx. Since last visit had acute MI, s/p PTCI, following w Dr Glennie Hawk. Attending cardiac rehab. Breathing fairly well. Taking Spiriva. Cough is stable. Very rare rescue inhaler use. She had flu shot and pneumovax this year. No exacerbations since last visit.   ROV 11/17/11 -- hx mild COPD, chronic cough with GERD and PND. Since last time she had done ok, then about 3 weeks ago some increased mucous and cough. Was rarely productive. May be some better, but still not back down to baseline. She is still taking her spiriva.   ROV 05/19/12 -- hx mild COPD, chronic cough with GERD and PND. Regular f/u. Her cough is stable, not really worse or better. Likely due to her grass and tree exposure. Occasional exertional SOB. Remains on Spiriva + SABA prn.   ROV 03/09/14 -- follow up visit for COPD and allergies. She has been treated for reflux before without effect.  She is on spiriva, has been doing well on it. She still coughs every day. No flares. Flu shot up to date.   ROV 03/22/15 -- follow-up visit for COPD, allergic rhinitis, chronic cough. Last time we stopped her Spiriva, she seemed to tolerate but in retrospect now she  does recognize some increased dyspnea in the last 4-5 months. Bending, doing chores more difficult. She can still walk indefinitely. She uses albuterol when she has coughing spells, seems to help her. Not on an allergy regimen.      Objective:   Physical Exam Filed Vitals:   03/22/15 1428  BP: 126/76  Pulse: 79   Filed Vitals:   03/22/15 1428  BP: 126/76  Pulse: 79  Height: 5' 2.5" (1.588 m)  Weight: 110 lb (49.896 kg)  SpO2: 96%    Gen: Pleasant, well-nourished, in no distress,  normal affect  ENT: No lesions,  mouth clear,  oropharynx clear, no postnasal drip  Neck: No  JVD, no TMG, no carotid bruits  Lungs: No use of accessory muscles, clear without rales or rhonchi  Chest wall nontender - no eccymosis noted.   No axillary adenopathy.   Cardiovascular: RRR, heart sounds normal, no murmur or gallops, no peripheral edema  Musculoskeletal: No deformities, no cyanosis or clubbing Cervical rom nm w/ no radicular symptoms   Neuro: alert, non focal  Skin: Warm, no lesions or rashes     Assessment & Plan:  COPD (chronic obstructive pulmonary disease) She stopped her Spiriva. May have some slight increase in her exertional dyspnea although she is not convinced that the cost benefit ratio favors restarting the medication. She does have albuterol available that she uses primarily for paroxysmal cough. At this time we will hold off on scheduled bronchodilators, reconsider depending on her symptoms  COUGH, CHRONIC Not currently on a GERD regimen or an allergy regimen. Her cough has continued despite GERD therapy. Her anatomy is normal based on ENT evaluation in 2012. I'll check a chest x-ray today and try Tessalon Perles when necessary

## 2015-05-22 DIAGNOSIS — M810 Age-related osteoporosis without current pathological fracture: Secondary | ICD-10-CM | POA: Diagnosis not present

## 2015-08-06 DIAGNOSIS — Z961 Presence of intraocular lens: Secondary | ICD-10-CM | POA: Diagnosis not present

## 2015-08-06 DIAGNOSIS — H26492 Other secondary cataract, left eye: Secondary | ICD-10-CM | POA: Diagnosis not present

## 2015-08-06 DIAGNOSIS — H524 Presbyopia: Secondary | ICD-10-CM | POA: Diagnosis not present

## 2015-08-09 DIAGNOSIS — D1801 Hemangioma of skin and subcutaneous tissue: Secondary | ICD-10-CM | POA: Diagnosis not present

## 2015-08-09 DIAGNOSIS — L570 Actinic keratosis: Secondary | ICD-10-CM | POA: Diagnosis not present

## 2015-08-09 DIAGNOSIS — Z85828 Personal history of other malignant neoplasm of skin: Secondary | ICD-10-CM | POA: Diagnosis not present

## 2015-08-14 DIAGNOSIS — H26492 Other secondary cataract, left eye: Secondary | ICD-10-CM | POA: Diagnosis not present

## 2015-09-14 ENCOUNTER — Other Ambulatory Visit: Payer: Self-pay | Admitting: *Deleted

## 2015-09-14 DIAGNOSIS — I2510 Atherosclerotic heart disease of native coronary artery without angina pectoris: Secondary | ICD-10-CM

## 2015-09-14 MED ORDER — CLOPIDOGREL BISULFATE 75 MG PO TABS: 75.0000 mg | ORAL_TABLET | Freq: Every day | ORAL | Status: DC

## 2015-09-21 ENCOUNTER — Other Ambulatory Visit: Payer: Self-pay | Admitting: *Deleted

## 2015-09-21 DIAGNOSIS — I2510 Atherosclerotic heart disease of native coronary artery without angina pectoris: Secondary | ICD-10-CM

## 2015-09-21 MED ORDER — CLOPIDOGREL BISULFATE 75 MG PO TABS: 75.0000 mg | ORAL_TABLET | Freq: Every day | ORAL | Status: DC

## 2015-10-09 ENCOUNTER — Other Ambulatory Visit: Payer: Self-pay | Admitting: Cardiovascular Disease

## 2015-10-16 ENCOUNTER — Encounter: Payer: Self-pay | Admitting: Internal Medicine

## 2015-10-16 DIAGNOSIS — Z1231 Encounter for screening mammogram for malignant neoplasm of breast: Secondary | ICD-10-CM | POA: Diagnosis not present

## 2015-10-16 DIAGNOSIS — Z6822 Body mass index (BMI) 22.0-22.9, adult: Secondary | ICD-10-CM | POA: Diagnosis not present

## 2015-10-16 DIAGNOSIS — Z01419 Encounter for gynecological examination (general) (routine) without abnormal findings: Secondary | ICD-10-CM | POA: Diagnosis not present

## 2015-10-16 DIAGNOSIS — Z124 Encounter for screening for malignant neoplasm of cervix: Secondary | ICD-10-CM | POA: Diagnosis not present

## 2015-10-16 DIAGNOSIS — Z1389 Encounter for screening for other disorder: Secondary | ICD-10-CM | POA: Diagnosis not present

## 2015-10-16 DIAGNOSIS — F320 Major depressive disorder, single episode, mild: Secondary | ICD-10-CM | POA: Diagnosis not present

## 2015-11-15 DIAGNOSIS — M810 Age-related osteoporosis without current pathological fracture: Secondary | ICD-10-CM | POA: Diagnosis not present

## 2015-11-20 ENCOUNTER — Encounter: Payer: Self-pay | Admitting: Cardiovascular Disease

## 2015-11-22 DIAGNOSIS — M25511 Pain in right shoulder: Secondary | ICD-10-CM | POA: Diagnosis not present

## 2015-11-22 DIAGNOSIS — M7541 Impingement syndrome of right shoulder: Secondary | ICD-10-CM | POA: Diagnosis not present

## 2015-11-26 DIAGNOSIS — M810 Age-related osteoporosis without current pathological fracture: Secondary | ICD-10-CM | POA: Diagnosis not present

## 2015-12-17 ENCOUNTER — Encounter: Payer: Self-pay | Admitting: Gastroenterology

## 2015-12-17 ENCOUNTER — Telehealth: Payer: Self-pay | Admitting: *Deleted

## 2015-12-17 ENCOUNTER — Ambulatory Visit: Payer: Medicare Other | Admitting: Internal Medicine

## 2015-12-17 ENCOUNTER — Ambulatory Visit (INDEPENDENT_AMBULATORY_CARE_PROVIDER_SITE_OTHER): Payer: Medicare Other | Admitting: Gastroenterology

## 2015-12-17 VITALS — BP 100/70 | HR 72 | Ht 62.75 in | Wt 119.0 lb

## 2015-12-17 DIAGNOSIS — I2510 Atherosclerotic heart disease of native coronary artery without angina pectoris: Secondary | ICD-10-CM | POA: Diagnosis not present

## 2015-12-17 DIAGNOSIS — Z1211 Encounter for screening for malignant neoplasm of colon: Secondary | ICD-10-CM | POA: Insufficient documentation

## 2015-12-17 DIAGNOSIS — Z7902 Long term (current) use of antithrombotics/antiplatelets: Secondary | ICD-10-CM | POA: Diagnosis not present

## 2015-12-17 NOTE — Telephone Encounter (Signed)
Called the patient to advise that we heard from Dr. Angelena Form and she can stop her Plavix on 9-15 and resume it on 01-24-2016.  Patient verbalized understanding the Plavix instructions.

## 2015-12-17 NOTE — Progress Notes (Signed)
Agree with initial assessment and plans as outlined 

## 2015-12-17 NOTE — Progress Notes (Signed)
12/17/2015 [name] [id number] 07/16/1945   HISTORY OF PRESENT ILLNESS:  This is a 70 year old female who is previously known to Dr. Henrene Pastor for colonoscopy 10 years ago. She is here today to schedule another screening procedure. She has no GI complaints. She is on Plavix for history of coronary artery disease with stents placed 5 years ago, which is the reason for her visit today. She follows with Dr. Angelena Form yearly and has had no new issues.  Past Medical History:  Diagnosis Date  . Arthritis   . COPD (chronic obstructive pulmonary disease) ([place])   . Coronary artery disease August 2012   Drug eluting stent mid LAD, drug eluting stent mid RCA  . Depression   . Emphysema   . Heart attack [facility]) August 2012  . Migraine   . TIA (transient ischemic attack) 1995   Past Surgical History:  Procedure Laterality Date  . APPENDECTOMY  01/1956  . CORONARY ANGIOPLASTY WITH STENT PLACEMENT  2012  . FOOT SURGERY Bilateral   . HAND SURGERY Left   . NASAL SEPTUM SURGERY    . SHOULDER SURGERY Left    frozen shoulder  . THORACOTOMY    . TONSILLECTOMY      reports that she quit smoking about 44 years ago. Her smoking use included Cigarettes. She has a 10.00 pack-year smoking history. She has never used smokeless tobacco. She reports that she does not drink alcohol or use drugs. family history includes Diabetes in her maternal aunt, maternal grandmother, and mother; Heart disease in her paternal grandfather and paternal grandmother; Lung cancer in her maternal uncle; Rheum arthritis in her maternal grandfather and maternal grandmother. Allergies  Allergen Reactions  . Naproxen Hives  . Tramadol Hcl Nausea And Vomiting      Outpatient Encounter Prescriptions as of 12/17/2015  Medication Sig  . albuterol (PROAIR HFA) 108 (90 BASE) MCG/ACT inhaler Inhale 2 puffs into the lungs every 4 (four) hours as needed for wheezing or shortness of breath.  Marland Kitchen aspirin 81 MG chewable tablet Chew 81  mg by mouth daily.    . Cholecalciferol (VITAMIN D) 2000 UNITS CAPS Take by mouth daily.    . clopidogrel (PLAVIX) 75 MG tablet Take 1 tablet (75 mg total) by mouth daily.  Marland Kitchen denosumab (PROLIA) 60 MG/ML SOLN injection Inject 60 mg into the skin every 6 (six) months. Administer in upper arm, thigh, or abdomen  . NITROSTAT 0.4 MG SL tablet PLACE 1 TABLET (0.4 MG TOTAL) UNDER THE TONGUE EVERY 5 (FIVE) MINUTES AS NEEDED FOR CHEST PAIN.  . rosuvastatin (CRESTOR) 10 MG tablet Take 1 tablet (10 mg total) by mouth at bedtime.  . sertraline (ZOLOFT) 50 MG tablet Take by mouth daily.    Marland Kitchen topiramate (TOPAMAX) 100 MG tablet Take by mouth daily.     No facility-administered encounter medications on file as of 12/17/2015.      REVIEW OF SYSTEMS  : All other systems reviewed and negative except where noted in the History of Present Illness.   PHYSICAL EXAM: BP 100/70 (BP Location: Left Arm, Patient Position: Sitting, Cuff Size: Normal)   Pulse 72   Ht 5' 2.75" (1.594 m) Comment: height measured without shoes  Wt 119 lb (54 kg)   BMI 21.25 kg/m  General: Well developed white female in no acute distress Head: Normocephalic and atraumatic Eyes:  Dclerae anicteric, conjunctiva pink. Ears: Normal auditory acuity Lungs: Clear throughout to auscultation Heart: Regular rate and rhythm Abdomen: Soft, non-distended.  Normal bowel sounds.  Non-tender. Rectal:  Will be done at the time of colonoscopy. Musculoskeletal: Symmetrical with no gross deformities  Skin: No lesions on visible extremities Extremities: No edema  Neurological: Alert oriented x 4, grossly non-focal Psychological:  Alert and cooperative. Normal mood and affect  ASSESSMENT AND PLAN: -Screening colonoscopy:  Last was 10 years ago.  Will schedule with Dr. Henrene Pastor. -CAD with history of stents 5 year ago, on Plavix:  Hold Plavix for 5 days before procedure - will instruct when and how to resume after procedure. Risks and benefits of  procedure including bleeding, perforation, infection, missed lesions, medication reactions and possible hospitalization or surgery if complications occur explained. Additional rare but real risk of cardiovascular event such as heart attack or ischemia/infarct of other organs off of Plavix explained and need to seek urgent help if this occurs. Will communicate by phone or EMR with patient's prescribing provider, Dr. Angelena Form, that to confirm holding Plavix is reasonable in this case   CC:  Briscoe Deutscher, MD

## 2015-12-17 NOTE — Telephone Encounter (Signed)
  12/17/2015   RE: [name] DOB: November 08, 1945 MRN: [id number]   Dear Dr.Christopher Angelena Form,    We have scheduled the above patient for an endoscopic procedure. Our records show that she is on anticoagulation therapy.   Please advise as to how long the patient may come off her therapy of Plavix prior to the procedure, which is scheduled for 01-23-2016.  Please fax back/ or route the completed form to [facility] at [phone].   Sincerely,    Alonza Bogus PA-C

## 2015-12-17 NOTE — Patient Instructions (Signed)
You have been scheduled for a colonoscopy. Please follow written instructions given to you at your visit today.  Please pick up your prep supplies at the pharmacy within the next 1-3 days. If you use inhalers (even only as needed), please bring them with you on the day of your procedure.   

## 2015-12-17 NOTE — Telephone Encounter (Signed)
No recent coronary interventions. She can hold her Plavix 5 days before the planned procedure.   [name] 12/17/2015 3:45 PM

## 2015-12-26 DIAGNOSIS — M25511 Pain in right shoulder: Secondary | ICD-10-CM | POA: Diagnosis not present

## 2015-12-26 DIAGNOSIS — M7541 Impingement syndrome of right shoulder: Secondary | ICD-10-CM | POA: Diagnosis not present

## 2016-01-02 ENCOUNTER — Encounter: Payer: Medicare Other | Admitting: Internal Medicine

## 2016-01-14 DIAGNOSIS — M810 Age-related osteoporosis without current pathological fracture: Secondary | ICD-10-CM | POA: Diagnosis not present

## 2016-01-14 DIAGNOSIS — Z23 Encounter for immunization: Secondary | ICD-10-CM | POA: Diagnosis not present

## 2016-01-14 DIAGNOSIS — I2510 Atherosclerotic heart disease of native coronary artery without angina pectoris: Secondary | ICD-10-CM | POA: Diagnosis not present

## 2016-01-14 DIAGNOSIS — I252 Old myocardial infarction: Secondary | ICD-10-CM | POA: Diagnosis not present

## 2016-01-14 DIAGNOSIS — G43109 Migraine with aura, not intractable, without status migrainosus: Secondary | ICD-10-CM | POA: Diagnosis not present

## 2016-01-14 DIAGNOSIS — Z0000 Encounter for general adult medical examination without abnormal findings: Secondary | ICD-10-CM | POA: Diagnosis not present

## 2016-01-14 DIAGNOSIS — Z131 Encounter for screening for diabetes mellitus: Secondary | ICD-10-CM | POA: Diagnosis not present

## 2016-01-16 ENCOUNTER — Telehealth: Payer: Self-pay | Admitting: Gastroenterology

## 2016-01-16 ENCOUNTER — Other Ambulatory Visit: Payer: Self-pay | Admitting: *Deleted

## 2016-01-16 MED ORDER — NA SULFATE-K SULFATE-MG SULF 17.5-3.13-1.6 GM/177ML PO SOLN: 1.0000 | Freq: Once | ORAL | 0 refills | Status: AC

## 2016-01-23 ENCOUNTER — Encounter: Payer: Medicare Other | Admitting: Internal Medicine

## 2016-01-23 ENCOUNTER — Encounter: Payer: Self-pay | Admitting: Internal Medicine

## 2016-01-23 ENCOUNTER — Ambulatory Visit (AMBULATORY_SURGERY_CENTER): Payer: Medicare Other | Admitting: Internal Medicine

## 2016-01-23 VITALS — BP 123/75 | HR 65 | Temp 98.0°F | Resp 20 | Ht 62.0 in | Wt 119.0 lb

## 2016-01-23 DIAGNOSIS — Z1211 Encounter for screening for malignant neoplasm of colon: Secondary | ICD-10-CM

## 2016-01-23 DIAGNOSIS — J449 Chronic obstructive pulmonary disease, unspecified: Secondary | ICD-10-CM | POA: Diagnosis not present

## 2016-01-23 DIAGNOSIS — D124 Benign neoplasm of descending colon: Secondary | ICD-10-CM

## 2016-01-23 DIAGNOSIS — R0600 Dyspnea, unspecified: Secondary | ICD-10-CM | POA: Diagnosis not present

## 2016-01-23 DIAGNOSIS — I2510 Atherosclerotic heart disease of native coronary artery without angina pectoris: Secondary | ICD-10-CM | POA: Diagnosis not present

## 2016-01-23 DIAGNOSIS — Z8673 Personal history of transient ischemic attack (TIA), and cerebral infarction without residual deficits: Secondary | ICD-10-CM | POA: Diagnosis not present

## 2016-01-23 DIAGNOSIS — I252 Old myocardial infarction: Secondary | ICD-10-CM | POA: Diagnosis not present

## 2016-01-23 MED ORDER — SODIUM CHLORIDE 0.9 % IV SOLN: 500.0000 mL | INTRAVENOUS | Status: DC

## 2016-01-23 NOTE — Progress Notes (Signed)
To PACU, vss patent aw report to rn 

## 2016-01-23 NOTE — Progress Notes (Signed)
Called to room to assist during endoscopic procedure.  Patient ID and intended procedure confirmed with present staff. Received instructions for my participation in the procedure from the performing physician.  

## 2016-01-23 NOTE — Patient Instructions (Signed)
Discharge instructions given. Handouts on polyps and diverticulosis. Resume previous medications. YOU HAD AN ENDOSCOPIC PROCEDURE TODAY AT THE [facility]:   Refer to the procedure report that was given to you for any specific questions about what was found during the examination.  If the procedure report does not answer your questions, please call your gastroenterologist to clarify.  If you requested that your care partner not be given the details of your procedure findings, then the procedure report has been included in a sealed envelope for you to review at your convenience later.  YOU SHOULD EXPECT: Some feelings of bloating in the abdomen. Passage of more gas than usual.  Walking can help get rid of the air that was put into your GI tract during the procedure and reduce the bloating. If you had a lower endoscopy (such as a colonoscopy or flexible sigmoidoscopy) you may notice spotting of blood in your stool or on the toilet paper. If you underwent a bowel prep for your procedure, you may not have a normal bowel movement for a few days.  Please Note:  You might notice some irritation and congestion in your nose or some drainage.  This is from the oxygen used during your procedure.  There is no need for concern and it should clear up in a day or so.  SYMPTOMS TO REPORT IMMEDIATELY:   Following lower endoscopy (colonoscopy or flexible sigmoidoscopy):  Excessive amounts of blood in the stool  Significant tenderness or worsening of abdominal pains  Swelling of the abdomen that is new, acute  Fever of 100F or higher   For urgent or emergent issues, a gastroenterologist can be reached at any hour by calling [phone].   DIET:  We do recommend a small meal at first, but then you may proceed to your regular diet.  Drink plenty of fluids but you should avoid alcoholic beverages for 24 hours.  ACTIVITY:  You should plan to take it easy for the rest of today and you should NOT  DRIVE or use heavy machinery until tomorrow (because of the sedation medicines used during the test).    FOLLOW UP: Our staff will call the number listed on your records the next business day following your procedure to check on you and address any questions or concerns that you may have regarding the information given to you following your procedure. If we do not reach you, we will leave a message.  However, if you are feeling well and you are not experiencing any problems, there is no need to return our call.  We will assume that you have returned to your regular daily activities without incident.  If any biopsies were taken you will be contacted by phone or by letter within the next 1-3 weeks.  Please call us at [phone] if you have not heard about the biopsies in 3 weeks.    SIGNATURES/CONFIDENTIALITY: You and/or your care partner have signed paperwork which will be entered into your electronic medical record.  These signatures attest to the fact that that the information above on your After Visit Summary has been reviewed and is understood.  Full responsibility of the confidentiality of this discharge information lies with you and/or your care-partner. 

## 2016-01-23 NOTE — Op Note (Signed)
[facility] Patient Name: [name] Procedure Date: 01/23/2016 1:40 PM MRN: [id number] Endoscopist: Docia Chuck. Henrene Pastor , MD Age: 70 Referring MD:  Date of Birth: 12/20/1945 Gender: Female Account #: [id number] Procedure:                Colonoscopy , with cold snare polypectomy x 1 Indications:              Screening for colorectal malignant neoplasm.                            Previous exam 2007 negative for neoplasia Medicines:                Monitored Anesthesia Care Procedure:                Pre-Anesthesia Assessment:                           - Prior to the procedure, a History and Physical                            was performed, and patient medications and                            allergies were reviewed. The patient's tolerance of                            previous anesthesia was also reviewed. The risks                            and benefits of the procedure and the sedation                            options and risks were discussed with the patient.                            All questions were answered, and informed consent                            was obtained. Prior Anticoagulants: The patient has                            taken Plavix (clopidogrel), last dose was 5 days                            prior to procedure. ASA Grade Assessment: III - A                            patient with severe systemic disease. After                            reviewing the risks and benefits, the patient was                            deemed in satisfactory condition to undergo the  procedure.                           After obtaining informed consent, the colonoscope                            was passed under direct vision. Throughout the                            procedure, the patient's blood pressure, pulse, and                            oxygen saturations were monitored continuously. The                            Model CF-HQ190L  [id number]) scope was introduced                            through the anus and advanced to the the cecum,                            identified by appendiceal orifice and ileocecal                            valve. The ileocecal valve, appendiceal orifice,                            and rectum were photographed. The quality of the                            bowel preparation was excellent. The colonoscopy                            was performed with difficulty secondary to                            diverticulosis with stenosis and fixed colon in the                            rectosigmoid region. this was traversed with the                            pediatric colonoscope with difficulty. The patient                            tolerated the procedure well. The bowel preparation                            used was SUPREP. Scope In: 2:01:54 PM Scope Out: 2:23:15 PM Scope Withdrawal Time: 0 hours 6 minutes 18 seconds  Total Procedure Duration: 0 hours 21 minutes 21 seconds  Findings:                 A 4 mm polyp was found in the descending colon. The  polyp was removed with a cold snare. Resection and                            retrieval were complete.                           Multiple diverticula were found in the sigmoid                            colon , with fixed stenosis.                           The exam was otherwise without abnormality on                            direct and retroflexion views. Complications:            No immediate complications. Estimated blood loss:                            None. Estimated Blood Loss:     Estimated blood loss: none. Impression:               - One 4 mm polyp in the descending colon, removed                            with a cold snare. Resected and retrieved.                           - Diverticulosis with fixed stenosis in the sigmoid                            colon.                           - The  examination was otherwise normal on direct                            and retroflexion views. Recommendation:           - Repeat colonoscopy is not recommended for                            surveillance , given the favorable findings today                            and your current age.                           - Resume Plavix (clopidogrel) today at prior dose.                           - Patient has a contact number available for                            emergencies. The signs and symptoms of potential  delayed complications were discussed with the                            patient. Return to normal activities tomorrow.                            Written discharge instructions were provided to the                            patient.                           - Resume previous diet.                           - Continue present medications.                           - Await pathology results. Docia Chuck. Henrene Pastor, MD 01/23/2016 2:29:32 PM This report has been signed electronically.

## 2016-01-24 ENCOUNTER — Telehealth: Payer: Self-pay | Admitting: *Deleted

## 2016-01-24 NOTE — Telephone Encounter (Signed)
  Follow up Call-  Call back number 01/23/2016  Post procedure Call Back phone  # [phone]  Permission to leave phone message Yes  Some recent data might be hidden     Patient questions:  Do you have a fever, pain , or abdominal swelling? No. Pain Score  0 *  Have you tolerated food without any problems? Yes.    Have you been able to return to your normal activities? Yes.    Do you have any questions about your discharge instructions: Diet   No. Medications  No. Follow up visit  No.  Do you have questions or concerns about your Care? No.  Actions: * If pain score is 4 or above: No action needed, pain <4.

## 2016-01-28 ENCOUNTER — Encounter: Payer: Self-pay | Admitting: Internal Medicine

## 2016-03-19 ENCOUNTER — Other Ambulatory Visit: Payer: Self-pay | Admitting: Cardiovascular Disease

## 2016-03-19 DIAGNOSIS — I2510 Atherosclerotic heart disease of native coronary artery without angina pectoris: Secondary | ICD-10-CM

## 2016-05-30 ENCOUNTER — Ambulatory Visit (INDEPENDENT_AMBULATORY_CARE_PROVIDER_SITE_OTHER): Payer: Medicare Other | Admitting: Cardiovascular Disease

## 2016-05-30 VITALS — BP 126/60 | HR 65 | Ht 62.5 in | Wt 115.0 lb

## 2016-05-30 DIAGNOSIS — M810 Age-related osteoporosis without current pathological fracture: Secondary | ICD-10-CM | POA: Diagnosis not present

## 2016-05-30 DIAGNOSIS — I2510 Atherosclerotic heart disease of native coronary artery without angina pectoris: Secondary | ICD-10-CM | POA: Diagnosis not present

## 2016-05-30 MED ORDER — CLOPIDOGREL BISULFATE 75 MG PO TABS: 75.0000 mg | ORAL_TABLET | Freq: Every day | ORAL | 3 refills | Status: DC

## 2016-05-30 NOTE — Progress Notes (Signed)
Chief Complaint  Patient presents with  . Follow-up    1 year f/u    History of Present Illness: 71 yo female with h/o CAD, COPD, GERD, depression, OA admitted to [facility] 12/28/10 with a NSTEMI. Cardiac cath with severe disease LAD and RCA. A 2.5 x 20 mm Promus drug eluting stent was placed in the mid LAD and a 3.0 x 12 mm Promus drug eluting stent was placed in the mid RCA in a staged procedure. Echo March 2013 in w/u of dyspnea with normal LV function and no valve issues. Stress myoview October 2014 without ischemia.   She is here today for follow up. She tells me that she has been feeling well. No exertional chest pain. Baseline SOB. She has been tolerating all medications. She exercises several times per week.   Primary Care Physician: Judge Stall, FNP  Past Medical History:  Diagnosis Date  . Allergy   . Arthritis   . COPD (chronic obstructive pulmonary disease) ([place])   . Coronary artery disease August 2012   Drug eluting stent mid LAD, drug eluting stent mid RCA  . Depression   . Emphysema   . Heart attack August 2012  . Migraine   . Osteoporosis   . TIA (transient ischemic attack) 1995    Past Surgical History:  Procedure Laterality Date  . APPENDECTOMY  01/1956  . CATARACT EXTRACTION    . CORONARY ANGIOPLASTY WITH STENT PLACEMENT  2012  . FOOT SURGERY Bilateral   . HAND SURGERY Left   . NASAL SEPTUM SURGERY    . SHOULDER SURGERY Left    frozen shoulder  . THORACOTOMY    . TONSILLECTOMY      Current Outpatient Prescriptions  Medication Sig Dispense Refill  . albuterol (PROAIR HFA) 108 (90 BASE) MCG/ACT inhaler Inhale 2 puffs into the lungs every 4 (four) hours as needed for wheezing or shortness of breath. 3 Inhaler 3  . aspirin 81 MG chewable tablet Chew 81 mg by mouth daily.      . Cholecalciferol (VITAMIN D) 2000 UNITS CAPS Take 2,000 Units by mouth daily.     . clopidogrel (PLAVIX) 75 MG tablet Take 1 tablet (75 mg total) by mouth daily. 90  tablet 3  . denosumab (PROLIA) 60 MG/ML SOLN injection Inject 60 mg into the skin every 6 (six) months. Administer in upper arm, thigh, or abdomen    . NITROSTAT 0.4 MG SL tablet PLACE 1 TABLET (0.4 MG TOTAL) UNDER THE TONGUE EVERY 5 (FIVE) MINUTES AS NEEDED FOR CHEST PAIN. 25 tablet 3  . sertraline (ZOLOFT) 100 MG tablet Take 100 mg by mouth daily.    Marland Kitchen topiramate (TOPAMAX) 100 MG tablet Take 100 mg by mouth daily.     . rosuvastatin (CRESTOR) 10 MG tablet Take 1 tablet (10 mg total) by mouth at bedtime. 90 tablet 3   Current Facility-Administered Medications  Medication Dose Route Frequency Provider Last Rate Last Dose  . 0.9 %  sodium chloride infusion  500 mL Intravenous Continuous Irene Shipper, MD        Allergies  Allergen Reactions  . Naproxen Hives  . Tramadol Hcl Nausea And Vomiting    Social History   Social History  . Marital status: Married    Spouse name: N/A  . Number of children: 2  . Years of education: N/A   Occupational History  . retired    Social History Main Topics  . Smoking status: Former Smoker  Packs/day: 1.00    Years: 10.00    Types: Cigarettes    Quit date: 05/06/1971  . Smokeless tobacco: Never Used  . Alcohol use No  . Drug use: No  . Sexual activity: Not on file   Other Topics Concern  . Not on file   Social History Narrative  . No narrative on file    Family History  Problem Relation Age of Onset  . Heart disease Paternal Grandmother   . Heart disease Paternal Grandfather   . Rheum arthritis Maternal Grandmother   . Diabetes Maternal Grandmother   . Rheum arthritis Maternal Grandfather   . Lung cancer Maternal Uncle   . Diabetes Mother   . Diabetes Maternal Aunt     Review of Systems:  As stated in the HPI and otherwise negative.   BP 126/60   Pulse 65   Ht 5' 2.5" (1.588 m)   Wt 115 lb (52.2 kg)   BMI 20.70 kg/m   Physical Examination: General: Well developed, well nourished, NAD  HEENT: OP clear, mucus membranes  moist  SKIN: warm, dry. No rashes. Neuro: No focal deficits  Musculoskeletal: Muscle strength 5/5 all ext  Psychiatric: Mood and affect normal  Neck: No JVD, no carotid bruits, no thyromegaly, no lymphadenopathy.  Lungs:Clear bilaterally, no wheezes, rhonci, crackles Cardiovascular: Regular rate and rhythm. No murmurs, gallops or rubs. Abdomen:Soft. Bowel sounds present. Non-tender.  Extremities: No lower extremity edema. Pulses are 2 + in the bilateral DP/PT.  Echo March 2013:  Left ventricle: LVEF is approximately 55 th 60% with mild inferoseptal hypokinesis. False tendon at LV apex. The cavity size was normal. Wall thickness was normal. Systolic function was normal. The estimated ejection fraction was in the range of 55% to 60%. Doppler parameters are consistent with abnormal left ventricular relaxation (grade 1 diastolic Dysfunction).  Stress myoview 02/25/13: Stress Procedure: The patient exercised on the treadmill utilizing the Bruce Protocol for 6:00 minutes. The patient stopped due to SOB, fatigue and denied any chest pain. Technetium 67m Sestamibi was injected at peak exercise and myocardial perfusion imaging was performed after a brief delay.  Stress ECG: No significant change from baseline ECG  QPS  Raw Data Images: Soft tissue (diaphragm) underlies the heart.  Stress Images: Thinning with decreased counts in the inferoseptal region (base, mid) Otherwise normal perfusioin.  Rest Images: No significant change from the rest images  Subtraction (SDS): No significant ischemia  Transient Ischemic Dilatation (Normal <1.22): 1.03  Lung/Heart Ratio (Normal <0.45): 0.28  Quantitative Gated Spect Images  QGS EDV: 47 ml  QGS ESV: 10 ml  Impression  Exercise Capacity: Good exercise capacity.  BP Response: Normal blood pressure response.  Clinical Symptoms: No significant symptoms noted.  ECG Impression: No significant ST segment change suggestive of ischemia.  Comparison with  Prior Nuclear Study: No images to compare  Overall Impression: Normal stress nuclear study.  LV Ejection Fraction: 78%. LV Wall Motion: NL LV Function; NL Wall Motion  EKG:  EKG is ordered today. The ekg ordered today demonstrates NSR, rate 65 bpm. PACs. Non-specific T wave abn, unchanged.   Recent Labs: No results found for requested labs within last 8760 hours.   Lipid Panel Primary care:    Wt Readings from Last 3 Encounters:  05/30/16 115 lb (52.2 kg)  01/23/16 119 lb (54 kg)  12/17/15 119 lb (54 kg)     Other studies Reviewed: Additional studies/ records that were reviewed today include: . Review of the above records  demonstrates:    Assessment and Plan:   1. CAD without angina:  She has no chest pain suggestive of angina. Stress test without ischemia October 2014. Will continue ASA/Plavix. She has not been on a beta blocker secondary to hypotension and she does not wish to start one. She is on a statin and lipids are at goal in primary care. Her BP is at goal.  Continue heart healthy diet and daily exercise. Will arrange echo to assess LV funciton.   Current medicines are reviewed at length with the patient today.  The patient does not have concerns regarding medicines.  The following changes have been made:  no change  Labs/ tests ordered today include:   Orders Placed This Encounter  Procedures  . EKG 12-Lead  . ECHOCARDIOGRAM COMPLETE    Disposition:   FU with me in 12  months  Signed, Lauree Chandler, MD 05/30/2016 11:15 AM    [facility] [address], [place], [place]  [address] Phone: [phone]; Fax: [phone]

## 2016-05-30 NOTE — Patient Instructions (Signed)

## 2016-06-13 ENCOUNTER — Other Ambulatory Visit: Payer: Self-pay

## 2016-06-13 ENCOUNTER — Ambulatory Visit (HOSPITAL_COMMUNITY): Payer: Medicare Other | Attending: Internal Medicine

## 2016-06-13 DIAGNOSIS — I5189 Other ill-defined heart diseases: Secondary | ICD-10-CM | POA: Insufficient documentation

## 2016-06-13 DIAGNOSIS — I2510 Atherosclerotic heart disease of native coronary artery without angina pectoris: Secondary | ICD-10-CM | POA: Insufficient documentation

## 2016-06-17 DIAGNOSIS — M810 Age-related osteoporosis without current pathological fracture: Secondary | ICD-10-CM | POA: Diagnosis not present

## 2016-06-20 DIAGNOSIS — R509 Fever, unspecified: Secondary | ICD-10-CM | POA: Diagnosis not present

## 2016-08-15 DIAGNOSIS — L812 Freckles: Secondary | ICD-10-CM | POA: Diagnosis not present

## 2016-08-15 DIAGNOSIS — L821 Other seborrheic keratosis: Secondary | ICD-10-CM | POA: Diagnosis not present

## 2016-08-15 DIAGNOSIS — L570 Actinic keratosis: Secondary | ICD-10-CM | POA: Diagnosis not present

## 2016-08-15 DIAGNOSIS — Z85828 Personal history of other malignant neoplasm of skin: Secondary | ICD-10-CM | POA: Diagnosis not present

## 2016-10-07 ENCOUNTER — Telehealth: Payer: Self-pay | Admitting: Cardiovascular Disease

## 2016-10-07 DIAGNOSIS — R002 Palpitations: Secondary | ICD-10-CM

## 2016-10-07 NOTE — Telephone Encounter (Signed)
I spoke with pt who reports for the last few days her heart feels like it is skipping. Is not continuous. Happens off and on.  Rate feels a little faster than normal at times.  No chest pain or shortness of breath.  Will forward to Dr. Angelena Form for review /recommendations.

## 2016-10-07 NOTE — Telephone Encounter (Signed)
New message    Pt is calling asking for a call back from RN. She states she is having episodes of afib. She is asking for a call back.

## 2016-10-08 ENCOUNTER — Telehealth: Payer: Self-pay | Admitting: Cardiovascular Disease

## 2016-10-08 NOTE — Telephone Encounter (Signed)
I discussed with [name] and she can see pt tomorrow at 4:30 to place monitor.  I spoke with pt and she will here tomorrow for this appointment.

## 2016-10-08 NOTE — Telephone Encounter (Signed)
I spoke with pt and scheduled appt for holter today at 4:30.  Follow up appt with Dr. Angelena Form scheduled for June 25,2018 at 10:40

## 2016-10-08 NOTE — Telephone Encounter (Signed)
Can we have her come by today for a 48 hour monitor. I can see her back in a few weeks. chris

## 2016-10-08 NOTE — Telephone Encounter (Signed)
New message    Patient calling has appt for monitor today @ 4:15 pm in emergency room with her Husband. - office next available on  6.19 pt refused

## 2016-10-09 ENCOUNTER — Ambulatory Visit (INDEPENDENT_AMBULATORY_CARE_PROVIDER_SITE_OTHER): Payer: Medicare Other

## 2016-10-09 DIAGNOSIS — R002 Palpitations: Secondary | ICD-10-CM

## 2016-10-26 NOTE — Progress Notes (Signed)
Chief Complaint  Patient presents with  . Follow-up    PT HAD  MONITOR DONE   History of Present Illness: 71 yo female with history of CAD, COPD, GERD here today for cardiac follow up. She had a NSTEMI in 2012 and was found to have severe disease in the mid LAD and mid RCA. A drug eluting stent was placed in the mid LAD and a drug eluting stent was placed in the mid RCA. Stress myoview October 2014 without ischemia. Echo February 2018 with normal LV size and function. She called our office with c/o palpitations 10/07/16. Cardiac monitor June 2018 with sinus rhythm, rare PVCs, frequent PACs with several 6 beat runs of SVT.   She is here today for follow up. She has been having palpitations every day, worse at night. No dizziness, near syncope or syncope. No chest pain. She has some dyspnea.    Primary Care Physician: Judge Stall, FNP  Past Medical History:  Diagnosis Date  . Allergy   . Arthritis   . COPD (chronic obstructive pulmonary disease) ([place])   . Coronary artery disease August 2012   Drug eluting stent mid LAD, drug eluting stent mid RCA  . Depression   . Emphysema   . Heart attack August 2012  . Migraine   . Osteoporosis   . TIA (transient ischemic attack) 1995    Past Surgical History:  Procedure Laterality Date  . APPENDECTOMY  01/1956  . CATARACT EXTRACTION    . CORONARY ANGIOPLASTY WITH STENT PLACEMENT  2012  . FOOT SURGERY Bilateral   . HAND SURGERY Left   . NASAL SEPTUM SURGERY    . SHOULDER SURGERY Left    frozen shoulder  . THORACOTOMY    . TONSILLECTOMY      Current Outpatient Prescriptions  Medication Sig Dispense Refill  . albuterol (PROAIR HFA) 108 (90 BASE) MCG/ACT inhaler Inhale 2 puffs into the lungs every 4 (four) hours as needed for wheezing or shortness of breath. 3 Inhaler 3  . aspirin EC 81 MG tablet Take 81 mg by mouth daily.    . Cholecalciferol (VITAMIN D) 2000 UNITS CAPS Take 2,000 Units by mouth daily.     . clopidogrel (PLAVIX) 75  MG tablet Take 1 tablet (75 mg total) by mouth daily. 90 tablet 3  . denosumab (PROLIA) 60 MG/ML SOLN injection Inject 60 mg into the skin every 6 (six) months. Administer in upper arm, thigh, or abdomen    . NITROSTAT 0.4 MG SL tablet PLACE 1 TABLET (0.4 MG TOTAL) UNDER THE TONGUE EVERY 5 (FIVE) MINUTES AS NEEDED FOR CHEST PAIN. 25 tablet 3  . rosuvastatin (CRESTOR) 10 MG tablet Take 1 tablet (10 mg total) by mouth at bedtime. 90 tablet 3  . sertraline (ZOLOFT) 100 MG tablet Take 100 mg by mouth daily.    Marland Kitchen topiramate (TOPAMAX) 100 MG tablet Take 100 mg by mouth daily.      Current Facility-Administered Medications  Medication Dose Route Frequency Provider Last Rate Last Dose  . 0.9 %  sodium chloride infusion  500 mL Intravenous Continuous Irene Shipper, MD        Allergies  Allergen Reactions  . Naproxen Hives  . Parabens     PT IS NOT SURE OF REACTION  . Estradiol Hives  . Tramadol Hcl Nausea And Vomiting    Social History   Social History  . Marital status: Married    Spouse name: N/A  . Number of children:  2  . Years of education: N/A   Occupational History  . retired    Social History Main Topics  . Smoking status: Former Smoker    Packs/day: 1.00    Years: 10.00    Types: Cigarettes    Quit date: 05/06/1971  . Smokeless tobacco: Never Used  . Alcohol use No  . Drug use: No  . Sexual activity: Not on file   Other Topics Concern  . Not on file   Social History Narrative  . No narrative on file    Family History  Problem Relation Age of Onset  . Heart disease Paternal Grandmother   . Heart disease Paternal Grandfather   . Rheum arthritis Maternal Grandmother   . Diabetes Maternal Grandmother   . Rheum arthritis Maternal Grandfather   . Lung cancer Maternal Uncle   . Diabetes Mother   . Diabetes Maternal Aunt     Review of Systems:  As stated in the HPI and otherwise negative.   BP 130/62   Pulse 72   Ht 5' 2.5" (1.588 m)   Wt 117 lb (53.1 kg)    BMI 21.06 kg/m   Physical Examination: General: Well developed, well nourished, NAD  HEENT: OP clear, mucus membranes moist  SKIN: warm, dry. No rashes. Neuro: No focal deficits  Musculoskeletal: Muscle strength 5/5 all ext  Psychiatric: Mood and affect normal  Neck: No JVD, no carotid bruits, no thyromegaly, no lymphadenopathy.  Lungs:Clear bilaterally, no wheezes, rhonci, crackles Cardiovascular: Regular rate and rhythm. No murmurs, gallops or rubs. Abdomen:Soft. Bowel sounds present. Non-tender.  Extremities: No lower extremity edema. Pulses are 2 + in the bilateral DP/PT.  Echo February 2018: Left ventricle: The cavity size was normal. Wall thickness was   normal. Systolic function was normal. The estimated ejection   fraction was in the range of 55% to 60%. Doppler parameters are   consistent with abnormal left ventricular relaxation (grade 1   diastolic dysfunction).  EKG:  EKG is not ordered today. The ekg ordered today demonstrates   Recent Labs: No results found for requested labs within last 8760 hours.   Lipid Panel Primary care:    Wt Readings from Last 3 Encounters:  10/27/16 117 lb (53.1 kg)  05/30/16 115 lb (52.2 kg)  01/23/16 119 lb (54 kg)     Other studies Reviewed: Additional studies/ records that were reviewed today include: . Review of the above records demonstrates:    Assessment and Plan:   1. CAD without angina:  She has no chest pain worrisome for angina. LV function normal by echo 2018. Will continue ASA and Plavix. She is on a statin. She has not been on a beta blocker due to hypotension.  LDL 60 01/14/16 in primary care  2. Premature atrial contractions/SVT: She is having daily palpitations. No atrial fib on monitor. We discussed medical therapy but will hold off for now. If she has worsened symptoms, will start Cardizem CD 120 mg daily. Will check BMET, CBC and TSH.   3. Fatigue: Likely not due to Premature beats. Check cbc, TSH  Current  medicines are reviewed at length with the patient today.  The patient does not have concerns regarding medicines.  The following changes have been made:  no change  Labs/ tests ordered today include:   Orders Placed This Encounter  Procedures  . Basic Metabolic Panel (BMET)  . CBC  . TSH    Disposition:   FU with me in 6  months  Signed, Lauree Chandler, MD 10/27/2016 11:27 AM    [address], [place], [place]  [address] Phone: [phone]; Fax: [phone]

## 2016-10-27 ENCOUNTER — Ambulatory Visit (INDEPENDENT_AMBULATORY_CARE_PROVIDER_SITE_OTHER): Payer: Medicare Other | Admitting: Cardiovascular Disease

## 2016-10-27 VITALS — BP 130/62 | HR 72 | Ht 62.5 in | Wt 117.0 lb

## 2016-10-27 DIAGNOSIS — R5383 Other fatigue: Secondary | ICD-10-CM | POA: Diagnosis not present

## 2016-10-27 DIAGNOSIS — I2510 Atherosclerotic heart disease of native coronary artery without angina pectoris: Secondary | ICD-10-CM

## 2016-10-27 DIAGNOSIS — I471 Supraventricular tachycardia: Secondary | ICD-10-CM | POA: Diagnosis not present

## 2016-10-27 LAB — BASIC METABOLIC PANEL
BUN / CREAT RATIO: 20 (ref 12–28)
BUN: 17 mg/dL (ref 8–27)
CALCIUM: 9.4 mg/dL (ref 8.7–10.3)
CO2: 20 mmol/L (ref 20–29)
CREATININE: 0.83 mg/dL (ref 0.57–1.00)
Chloride: 107 mmol/L — ABNORMAL HIGH (ref 96–106)
GFR calc non Af Amer: 72 mL/min/{1.73_m2} (ref >59)
GFR, EST AFRICAN AMERICAN: 83 mL/min/{1.73_m2} (ref >59)
Glucose: 83 mg/dL (ref 65–99)
Potassium: 4.1 mmol/L (ref 3.5–5.2)
Sodium: 140 mmol/L (ref 134–144)

## 2016-10-27 LAB — CBC
HEMATOCRIT: 41.7 % (ref 34.0–46.6)
HEMOGLOBIN: 13.6 g/dL (ref 11.1–15.9)
MCH: 29.1 pg (ref 26.6–33.0)
MCHC: 32.6 g/dL (ref 31.5–35.7)
MCV: 89 fL (ref 79–97)
Platelets: 279 10*3/uL (ref 150–379)
RBC: 4.68 x10E6/uL (ref 3.77–5.28)
RDW: 14.7 % (ref 12.3–15.4)
WBC: 6.3 10*3/uL (ref 3.4–10.8)

## 2016-10-27 LAB — TSH: TSH: 3.33 u[IU]/mL (ref 0.450–4.500)

## 2016-10-27 NOTE — Patient Instructions (Signed)
Medication Instructions:  Your physician recommends that you continue on your current medications as directed. Please refer to the Current Medication list given to you today.   Labwork: Lab work to be done today--BMP, CBC, TSH  Testing/Procedures: none  Follow-Up: Your physician recommends that you schedule a follow-up appointment in: 6 months. Please call our office in about 3 months to schedule this appointment.     Any Other Special Instructions Will Be Listed Below (If Applicable).     If you need a refill on your cardiac medications before your next appointment, please call your pharmacy.

## 2016-10-28 ENCOUNTER — Telehealth: Payer: Self-pay | Admitting: *Deleted

## 2016-10-28 NOTE — Telephone Encounter (Signed)
Patient informed. 

## 2016-10-28 NOTE — Telephone Encounter (Signed)
-----   Message from Burnell Blanks, MD sent at 10/28/2016 12:26 PM EDT ----- BMET and CBC are ok. TSH is normal. Can we let her know? Thanks, chris

## 2016-11-06 DIAGNOSIS — Z01419 Encounter for gynecological examination (general) (routine) without abnormal findings: Secondary | ICD-10-CM | POA: Diagnosis not present

## 2016-11-06 DIAGNOSIS — Z6821 Body mass index (BMI) 21.0-21.9, adult: Secondary | ICD-10-CM | POA: Diagnosis not present

## 2016-11-06 DIAGNOSIS — F320 Major depressive disorder, single episode, mild: Secondary | ICD-10-CM | POA: Diagnosis not present

## 2016-11-06 DIAGNOSIS — Z1231 Encounter for screening mammogram for malignant neoplasm of breast: Secondary | ICD-10-CM | POA: Diagnosis not present

## 2016-11-06 DIAGNOSIS — Z1389 Encounter for screening for other disorder: Secondary | ICD-10-CM | POA: Diagnosis not present

## 2016-11-06 DIAGNOSIS — N952 Postmenopausal atrophic vaginitis: Secondary | ICD-10-CM | POA: Diagnosis not present

## 2016-11-17 ENCOUNTER — Other Ambulatory Visit: Payer: Self-pay | Admitting: Cardiovascular Disease

## 2016-12-10 DIAGNOSIS — I2510 Atherosclerotic heart disease of native coronary artery without angina pectoris: Secondary | ICD-10-CM | POA: Diagnosis not present

## 2016-12-10 DIAGNOSIS — Z5181 Encounter for therapeutic drug level monitoring: Secondary | ICD-10-CM | POA: Diagnosis not present

## 2016-12-10 DIAGNOSIS — M810 Age-related osteoporosis without current pathological fracture: Secondary | ICD-10-CM | POA: Diagnosis not present

## 2016-12-10 DIAGNOSIS — I252 Old myocardial infarction: Secondary | ICD-10-CM | POA: Diagnosis not present

## 2017-01-05 IMAGING — DX DG CHEST 2V
2 series · 2 of 2 positions shown · non-contrast
Comparison: Radiographs 12/28/2010 and 07/30/2010.

CLINICAL DATA: Chronic cough. History of COPD, coronary artery
disease and myocardial infarction. Initial encounter.

EXAM:
CHEST  2 VIEW

[chest pa]
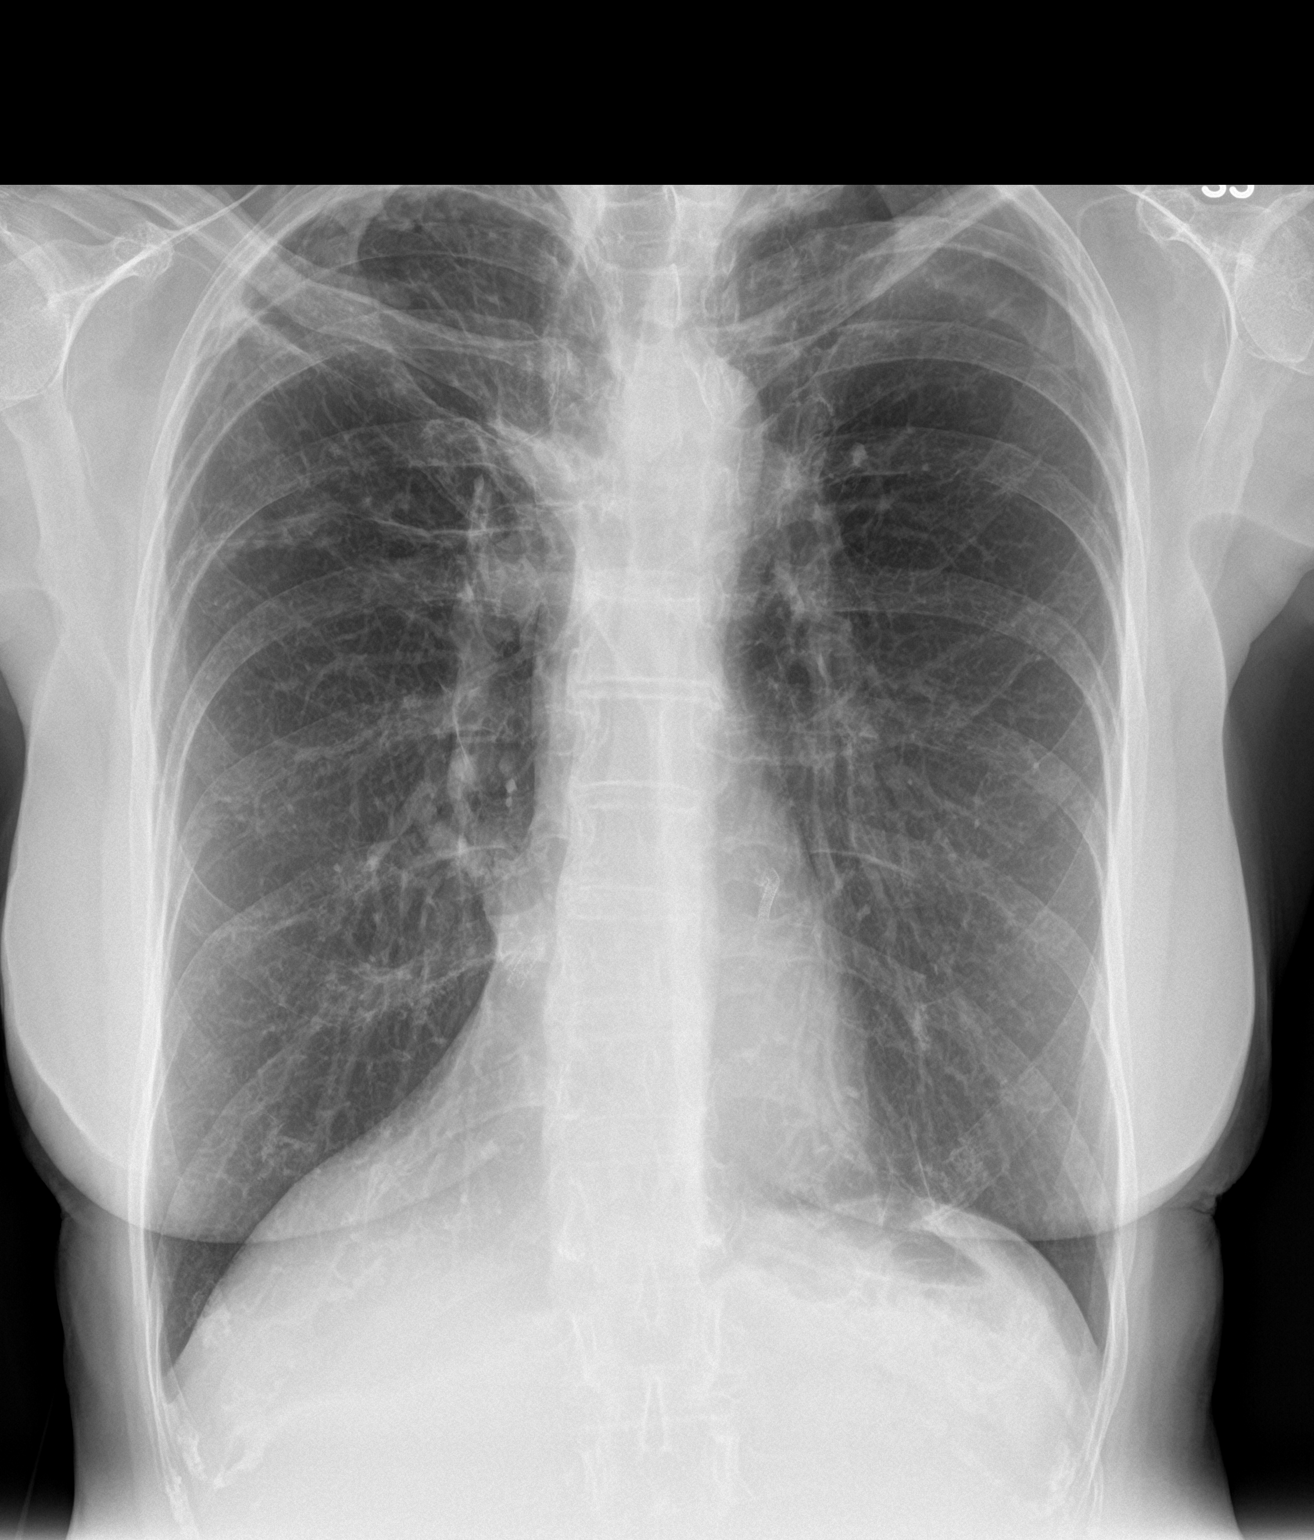

[chest lat]
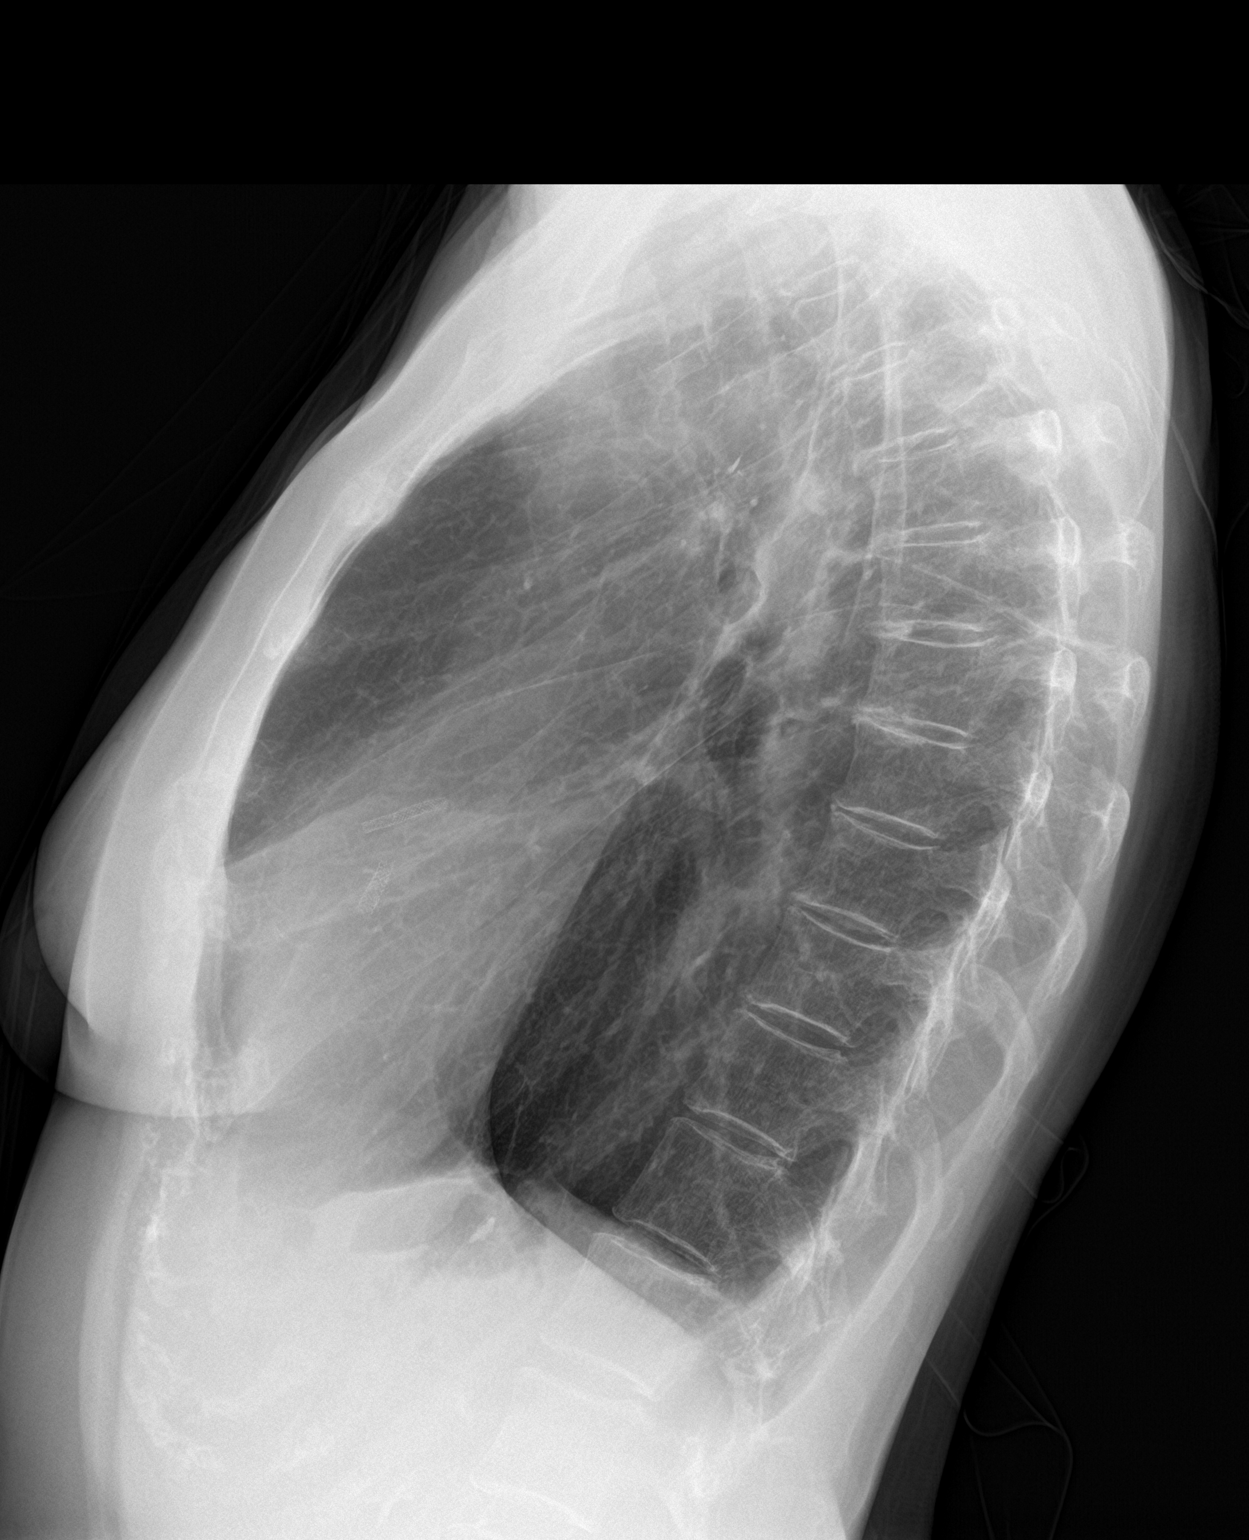

[2 of 2 positions shown; findings below may reference images not displayed]

FINDINGS: The heart size and mediastinal contours are stable with bilateral
hilar superior retraction. Coronary artery stents are noted. There
is volume loss in both upper lobes with biapical scarring. The lungs
are otherwise hyperinflated. No airspace disease or focal nodule
identified. There is no pleural effusion or pneumothorax.
IMPRESSION: Stable chronic lung disease with emphysema and biapical scarring
with associated superior hilar retraction. No acute findings.

## 2017-01-14 DIAGNOSIS — G43109 Migraine with aura, not intractable, without status migrainosus: Secondary | ICD-10-CM | POA: Diagnosis not present

## 2017-01-14 DIAGNOSIS — I252 Old myocardial infarction: Secondary | ICD-10-CM | POA: Diagnosis not present

## 2017-01-14 DIAGNOSIS — Z0000 Encounter for general adult medical examination without abnormal findings: Secondary | ICD-10-CM | POA: Diagnosis not present

## 2017-01-14 DIAGNOSIS — Z23 Encounter for immunization: Secondary | ICD-10-CM | POA: Diagnosis not present

## 2017-01-14 DIAGNOSIS — M810 Age-related osteoporosis without current pathological fracture: Secondary | ICD-10-CM | POA: Diagnosis not present

## 2017-01-14 DIAGNOSIS — G4700 Insomnia, unspecified: Secondary | ICD-10-CM | POA: Diagnosis not present

## 2017-01-14 DIAGNOSIS — I2510 Atherosclerotic heart disease of native coronary artery without angina pectoris: Secondary | ICD-10-CM | POA: Diagnosis not present

## 2017-04-30 ENCOUNTER — Encounter: Payer: Self-pay | Admitting: Cardiology

## 2017-04-30 ENCOUNTER — Ambulatory Visit (INDEPENDENT_AMBULATORY_CARE_PROVIDER_SITE_OTHER): Payer: Medicare Other | Admitting: Cardiology

## 2017-04-30 VITALS — BP 96/56 | HR 69 | Ht 62.0 in | Wt 119.4 lb

## 2017-04-30 DIAGNOSIS — R0609 Other forms of dyspnea: Secondary | ICD-10-CM

## 2017-04-30 DIAGNOSIS — I2510 Atherosclerotic heart disease of native coronary artery without angina pectoris: Secondary | ICD-10-CM

## 2017-04-30 NOTE — Progress Notes (Signed)
04/30/2017 [name]   10-09-1945  [id number]  Primary Physician Glenford Bayley, DO Primary Cardiologist: Dr. Angelena Form  Reason for Visit/CC: 6 Month F/u for CAD and PVCs  HPI:   71 yo female with history of CAD, COPD, GERD here today for cardiac follow up. She had a NSTEMI in 2012 and was found to have severe disease in the mid LAD and mid RCA. A drug eluting stent was placed in the mid LAD and a drug eluting stent was placed in the mid RCA. Stress myoview October 2014 without ischemia. Echo February 2018 with normal LV size and function. She called our office with c/o palpitations 10/07/16. Cardiac monitor June 2018 with sinus rhythm, rare PVCs, frequent PACs with several 6 beat runs of SVT. TSH, CBC and BMP was all normal.   She is followed by Dr. Angelena Form and presents to clinic for routine f/u. She was last seen by him on 10/27/16. She denied CP but complained of daily palpitations (symptomatic PACs/ PVCs). Dr. Angelena Form discussed medical management, however decision was made by pt to hold off and monitor for worsening symptoms. He advised initiation of Cardizem if worsening symptoms. Pt instructed to f/u in 6 months.   She is here today for 6 month f/u. EKG shows NSR with nonspecific ST abnormality (unchanged from previous). BP is 96/56. HR 69 bpm. She denies CP and no further palpations, however she has had mild exertional dyspnea and fatigue, which were symptoms she had prior to her MI.   Current Meds  Medication Sig  . albuterol (PROAIR HFA) 108 (90 BASE) MCG/ACT inhaler Inhale 2 puffs into the lungs every 4 (four) hours as needed for wheezing or shortness of breath.  Marland Kitchen aspirin EC 81 MG tablet Take 81 mg by mouth daily.  . Cholecalciferol (VITAMIN D) 2000 UNITS CAPS Take 2,000 Units by mouth daily.   . clopidogrel (PLAVIX) 75 MG tablet Take 1 tablet (75 mg total) by mouth daily.  Marland Kitchen denosumab (PROLIA) 60 MG/ML SOLN injection Inject 60 mg into the skin every 6 (six) months. Administer in  upper arm, thigh, or abdomen  . nitroGLYCERIN (NITROSTAT) 0.4 MG SL tablet PLACE 1 TABLET (0.4 MG TOTAL) UNDER THE TONGUE EVERY 5 (FIVE) MINUTES AS NEEDED FOR CHEST PAIN.  Marland Kitchen sertraline (ZOLOFT) 100 MG tablet Take 100 mg by mouth daily.  Marland Kitchen topiramate (TOPAMAX) 100 MG tablet Take 100 mg by mouth daily.    Current Facility-Administered Medications for the 04/30/17 encounter (Office Visit) with Consuelo Pandy, PA-C  Medication  . 0.9 %  sodium chloride infusion   Allergies  Allergen Reactions  . Naproxen Hives  . Parabens     PT IS NOT SURE OF REACTION  . Estradiol Hives  . Tramadol Hcl Nausea And Vomiting   Past Medical History:  Diagnosis Date  . Allergy   . Arthritis   . COPD (chronic obstructive pulmonary disease) ([place])   . Coronary artery disease August 2012   Drug eluting stent mid LAD, drug eluting stent mid RCA  . Depression   . Emphysema   . Heart attack [facility]) August 2012  . Migraine   . Osteoporosis   . TIA (transient ischemic attack) 1995   Family History  Problem Relation Age of Onset  . Heart disease Paternal Grandmother   . Heart disease Paternal Grandfather   . Rheum arthritis Maternal Grandmother   . Diabetes Maternal Grandmother   . Rheum arthritis Maternal Grandfather   . Lung cancer Maternal  Uncle   . Diabetes Mother   . Diabetes Maternal Aunt    Past Surgical History:  Procedure Laterality Date  . APPENDECTOMY  01/1956  . CATARACT EXTRACTION    . CORONARY ANGIOPLASTY WITH STENT PLACEMENT  2012  . FOOT SURGERY Bilateral   . HAND SURGERY Left   . NASAL SEPTUM SURGERY    . SHOULDER SURGERY Left    frozen shoulder  . THORACOTOMY    . TONSILLECTOMY     Social History   Socioeconomic History  . Marital status: Married    Spouse name: Not on file  . Number of children: 2  . Years of education: Not on file  . Highest education level: Not on file  Social Needs  . Financial resource strain: Not on file  . Food insecurity - worry: Not on  file  . Food insecurity - inability: Not on file  . Transportation needs - medical: Not on file  . Transportation needs - non-medical: Not on file  Occupational History  . Occupation: retired  Tobacco Use  . Smoking status: Former Smoker    Packs/day: 1.00    Years: 10.00    Pack years: 10.00    Types: Cigarettes    Last attempt to quit: 05/06/1971    Years since quitting: 46.0  . Smokeless tobacco: Never Used  Substance and Sexual Activity  . Alcohol use: No  . Drug use: No  . Sexual activity: Not on file  Other Topics Concern  . Not on file  Social History Narrative  . Not on file     Review of Systems: General: negative for chills, fever, night sweats or weight changes.  Cardiovascular: negative for chest pain, dyspnea on exertion, edema, orthopnea, palpitations, paroxysmal nocturnal dyspnea or shortness of breath Dermatological: negative for rash Respiratory: negative for cough or wheezing Urologic: negative for hematuria Abdominal: negative for nausea, vomiting, diarrhea, bright red blood per rectum, melena, or hematemesis Neurologic: negative for visual changes, syncope, or dizziness All other systems reviewed and are otherwise negative except as noted above.   Physical Exam:  Blood pressure (!) 96/56, pulse 69, height 5\' 2"  (1.575 m), weight 119 lb 6.4 oz (54.2 kg), peak flow 94 L/min.  General appearance: alert, cooperative and no distress Neck: no carotid bruit and no JVD Lungs: clear to auscultation bilaterally Heart: regular rate and rhythm, S1, S2 normal, no murmur, click, rub or gallop Extremities: extremities normal, atraumatic, no cyanosis or edema Pulses: 2+ and symmetric Skin: Skin color, texture, turgor normal. No rashes or lesions Neurologic: Grossly normal  EKG NSR nonspecific ST abnormalities 69 bpm -- personally reviewed   ASSESSMENT AND PLAN:   1. CAD: h/o MI in 2012 with DES to the mid LAD and RCA. She denies CP but has had recent mild  exertional dyspnea and fatigue, which were also present when she had her MI. No LEE, orthopnea or PND. EKG NSR with nonspecific ST abnormalities, unchanged from previous. We will set up for exercise nuclear stress test to r/o ischemia. Continue ASA, statin and Plavix. No BB given soft BP and HR in the 60s.   2. H/O PACs/PVCs: noted on monitor 10/2016. TSH, CBC and BMP normal. Pt denies any recent issues with palpitations. No medical management for now. She will notify us if recurrent palpitations.   3. Lipids: FLP 12/2016 showed controlled LDL at 57 mg/dL. Continue statin therapy with Crestor.    Follow-Up after stress test.   [name], MHS [facility]  04/30/2017 3:25 PM

## 2017-04-30 NOTE — Patient Instructions (Signed)
Medication Instructions:  Your physician recommends that you continue on your current medications as directed. Please refer to the Current Medication list given to you today.   Labwork: None ordered  Testing/Procedures: Your physician has requested that you have an exercise stress myoview. For further information please visit [email or web address]. Please follow instruction sheet, as given.   Follow-Up: Your physician recommends that you schedule a follow-up appointment with Lyda Jester, PA-C after testing. Same day as Dr. Angelena Form is in office if possible   Any Other Special Instructions Will Be Listed Below (If Applicable).     If you need a refill on your cardiac medications before your next appointment, please call your pharmacy.

## 2017-05-04 ENCOUNTER — Telehealth (HOSPITAL_COMMUNITY): Payer: Self-pay | Admitting: *Deleted

## 2017-05-04 NOTE — Telephone Encounter (Signed)
Patient given detailed instructions per Myocardial Perfusion Study Information Sheet for the test on 05/08/17 at 0930. Patient notified to arrive 15 minutes early and that it is imperative to arrive on time for appointment to keep from having the test rescheduled.  If you need to cancel or reschedule your appointment, please call the office within 24 hours of your appointment. . Patient verbalized understanding.Rani Idler, Ranae Palms

## 2017-05-08 ENCOUNTER — Ambulatory Visit (HOSPITAL_COMMUNITY): Payer: Medicare Other | Attending: Cardiovascular Disease

## 2017-05-08 DIAGNOSIS — R0609 Other forms of dyspnea: Secondary | ICD-10-CM | POA: Insufficient documentation

## 2017-05-08 LAB — MYOCARDIAL PERFUSION IMAGING
CHL CUP MPHR: 149 {beats}/min
CHL CUP NUCLEAR SDS: 4
CSEPED: 5 min
CSEPEW: 7 METS
Exercise duration (sec): 0 s
LHR: 0.29
LV sys vol: 11 mL
LVDIAVOL: 48 mL (ref 46–106)
NUC STRESS TID: 0.88
Peak HR: 146 {beats}/min
Percent HR: 97 %
Rest HR: 76 {beats}/min
SRS: 7
SSS: 11

## 2017-05-08 MED ORDER — TECHNETIUM TC 99M TETROFOSMIN IV KIT
33.0000 | PACK | Freq: Once | INTRAVENOUS | Status: AC | PRN
  Administered 2017-05-08: 33 via INTRAVENOUS
  Filled 2017-05-08: qty 33

## 2017-05-08 MED ORDER — TECHNETIUM TC 99M TETROFOSMIN IV KIT
10.1000 | PACK | Freq: Once | INTRAVENOUS | Status: AC | PRN
  Administered 2017-05-08: 10.1 via INTRAVENOUS
  Filled 2017-05-08: qty 11

## 2017-05-21 ENCOUNTER — Ambulatory Visit (INDEPENDENT_AMBULATORY_CARE_PROVIDER_SITE_OTHER): Payer: Medicare Other | Admitting: Cardiology

## 2017-05-21 ENCOUNTER — Encounter: Payer: Self-pay | Admitting: Cardiology

## 2017-05-21 VITALS — BP 118/70 | HR 71 | Ht 63.0 in | Wt 119.0 lb

## 2017-05-21 DIAGNOSIS — R0609 Other forms of dyspnea: Secondary | ICD-10-CM

## 2017-05-21 NOTE — Progress Notes (Signed)
05/21/2017 [name]   [birth year]  [id number]  Primary Physician Glenford Bayley, DO Primary Cardiologist: Dr. Angelena Form  Reason for Visit/CC: F/u for Exertional Dyspnea and Fatigue  HPI:  72 yo female withhistory ofCAD, COPD, GERDand palpitations. She had a NSTEMI in 2012 and was found to have severe disease in the mid LAD and mid RCA. A drug eluting stent was placed in the mid LAD and a drug eluting stent was placed in the mid RCA.Stress myoview October 2014 without ischemia.Echo February 2018 with normal LV size and function. She called our office with c/o palpitations 10/07/16. Cardiac monitor June 2018 with sinus rhythm, rare PVCs, frequent PACs with several 6 beat runs of SVT. TSH, CBC and BMP was all normal.  Dr. Angelena Form discussed medical management, however decision was made by pt to hold off and monitor for worsening symptoms. He advised initiation of Cardizem if worsening symptoms.  Pt was recently seen by me 04/30/17 for routine 6 month f/u. She denied CP and no further palpations, however did endorse mild exertional dyspnea and fatigue, which were symptoms she had prior to her MI. she was ordered to get a stress test. This was done 05/08/17 and was an overall low risk study with no areas of significant ischemia. There was a small defect of mild severity present in the basal anterolateral location. There is an apical anterior defect seen at rest, not stress- felt to be possible artifact. LVEF was normal.  Pt now presents back to clinic to re-evaluate for symptoms. She continues to have exertional dyspnea and fatigue. She denies CP. No palpitations, dizziness, syncope/ near syncope. No melena or hematochezia. She has arranged f/u with her pulmonologist, Dr. Lamonte Sakai, next week to see if her COPD has worsened any.   In regards to her fatigue, she thinks that maybe Crestor is the cause. Her fatigue started around the time she started it and she notes a friend also did not tolerate Crestor due  to fatigue.   NST 05/08/17 Study Highlights     Nuclear stress EF: 78%. No wall motion abnormalities.  There was no ST segment deviation noted during stress.  Defect 1: There is a small defect of mild severity present in the basal anterolateral location. There is an apical anterior defect seen at rest, not stress.  Overall low risk nuclear stress test with no areas of significant ischemia identified. It is possible that the basal anterolateral defect is artifactual.       Current Meds  Medication Sig  . albuterol (PROAIR HFA) 108 (90 BASE) MCG/ACT inhaler Inhale 2 puffs into the lungs every 4 (four) hours as needed for wheezing or shortness of breath.  Marland Kitchen aspirin EC 81 MG tablet Take 81 mg by mouth daily.  . Cholecalciferol (VITAMIN D) 2000 UNITS CAPS Take 2,000 Units by mouth daily.   . clopidogrel (PLAVIX) 75 MG tablet Take 1 tablet (75 mg total) by mouth daily.  Marland Kitchen denosumab (PROLIA) 60 MG/ML SOLN injection Inject 60 mg into the skin every 6 (six) months. Administer in upper arm, thigh, or abdomen  . nitroGLYCERIN (NITROSTAT) 0.4 MG SL tablet PLACE 1 TABLET (0.4 MG TOTAL) UNDER THE TONGUE EVERY 5 (FIVE) MINUTES AS NEEDED FOR CHEST PAIN.  Marland Kitchen sertraline (ZOLOFT) 100 MG tablet Take 100 mg by mouth daily.  Marland Kitchen topiramate (TOPAMAX) 100 MG tablet Take 100 mg by mouth daily.    Current Facility-Administered Medications for the 05/21/17 encounter (Office Visit) with Cynthia Pandy, PA-C  Medication  .  0.9 %  sodium chloride infusion   Allergies  Allergen Reactions  . Naproxen Hives  . Parabens     PT IS NOT SURE OF REACTION  . Estradiol Hives  . Tramadol Hcl Nausea And Vomiting   Past Medical History:  Diagnosis Date  . Allergy   . Arthritis   . COPD (chronic obstructive pulmonary disease) ([place])   . Coronary artery disease August 2012   Drug eluting stent mid LAD, drug eluting stent mid RCA  . Depression   . Emphysema   . Heart attack [facility]) August 2012  . Migraine   .  Osteoporosis   . TIA (transient ischemic attack) 1995   Family History  Problem Relation Age of Onset  . Heart disease Paternal Grandmother   . Heart disease Paternal Grandfather   . Rheum arthritis Maternal Grandmother   . Diabetes Maternal Grandmother   . Rheum arthritis Maternal Grandfather   . Lung cancer Maternal Uncle   . Diabetes Mother   . Diabetes Maternal Aunt    Past Surgical History:  Procedure Laterality Date  . APPENDECTOMY  01/1956  . CATARACT EXTRACTION    . CORONARY ANGIOPLASTY WITH STENT PLACEMENT  2012  . FOOT SURGERY Bilateral   . HAND SURGERY Left   . NASAL SEPTUM SURGERY    . SHOULDER SURGERY Left    frozen shoulder  . THORACOTOMY    . TONSILLECTOMY     Social History   Socioeconomic History  . Marital status: Married    Spouse name: Not on file  . Number of children: 2  . Years of education: Not on file  . Highest education level: Not on file  Social Needs  . Financial resource strain: Not on file  . Food insecurity - worry: Not on file  . Food insecurity - inability: Not on file  . Transportation needs - medical: Not on file  . Transportation needs - non-medical: Not on file  Occupational History  . Occupation: retired  Tobacco Use  . Smoking status: Former Smoker    Packs/day: 1.00    Years: 10.00    Pack years: 10.00    Types: Cigarettes    Last attempt to quit: 05/06/1971    Years since quitting: 46.0  . Smokeless tobacco: Never Used  Substance and Sexual Activity  . Alcohol use: No  . Drug use: No  . Sexual activity: Not on file  Other Topics Concern  . Not on file  Social History Narrative  . Not on file     Review of Systems: General: negative for chills, fever, night sweats or weight changes.  Cardiovascular: negative for chest pain, dyspnea on exertion, edema, orthopnea, palpitations, paroxysmal nocturnal dyspnea or shortness of breath Dermatological: negative for rash Respiratory: negative for cough or  wheezing Urologic: negative for hematuria Abdominal: negative for nausea, vomiting, diarrhea, bright red blood per rectum, melena, or hematemesis Neurologic: negative for visual changes, syncope, or dizziness All other systems reviewed and are otherwise negative except as noted above.   Physical Exam:  Blood pressure 118/70, pulse 71, height 5\' 3"  (1.6 m), weight 119 lb (54 kg), SpO2 92 %.  General appearance: alert, cooperative and no distress Neck: no carotid bruit and no JVD Lungs: clear to auscultation bilaterally Heart: regular rate and rhythm, S1, S2 normal, no murmur, click, rub or gallop Extremities: extremities normal, atraumatic, no cyanosis or edema Pulses: 2+ and symmetric Skin: Skin color, texture, turgor normal. No rashes or lesions Neurologic: Grossly normal  EKG Not performed -- personally reviewed   ASSESSMENT AND PLAN:   1. Exertional Dyspnea: moderate activity. No difficulties with ADLs. Chronic issue for several months. Not any worse. No resting dyspnea. No associated CP, palpitations, dizziness, fatigue, weight gain, edema, melena, hematochezia. Recent CBC showed normal Hgb. NST negative for ischemia. LVEF normal. No murmur on exam. No edema noted. No crackles. Pt plans to f/u with pulmonologist next week to exclude pulmonary etiology. If negative pul w/u, she will follow back up to further discuss medical management with addition of BB +/- LA nitrate therapy and possible definitive right/LHC. Pt is in agreement with this plan.   2. Fatigue: pt thinks Crestor is the culprit as noted in HPI above. Pt can try coming off of Crestor for 2 weeks to see if any improvement. If no improvement, restart. If improvement, then we will permanently stop Crestor and treat with another agent. Pt expressed interest in learning about PCSK9 inhibitor as she has a family member who has had good success with Praulent.   3. CAD: h/o previous stenting of the LAD and RCA. No chest pain but  some exertional dyspnea. NST low risk w/ normal LVEF. Pt to undergo pulmonary w/u and if negative, she will return to discuss medical management with addition of BB and or LA nitrate to regimen vs definative R/LHC.    [name], MHS [facility] 05/21/2017 11:06 AM

## 2017-05-21 NOTE — Patient Instructions (Addendum)
Medication Instructions:  Your physician recommends that you continue on your current medications as directed. Please refer to the Current Medication list given to you today.   Labwork: None ordered  Testing/Procedures: None ordered  Follow-Up: Your physician wants you to follow-up in: 6 MONTHS WITH DR. MCALHANY   You will receive a reminder letter in the mail two months in advance. If you don't receive a letter, please call our office to schedule the follow-up appointment.   Any Other Special Instructions Will Be Listed Below (If Applicable).     If you need a refill on your cardiac medications before your next appointment, please call your pharmacy.   

## 2017-05-29 ENCOUNTER — Encounter: Payer: Self-pay | Admitting: Emergency Medicine

## 2017-05-29 ENCOUNTER — Ambulatory Visit (INDEPENDENT_AMBULATORY_CARE_PROVIDER_SITE_OTHER): Payer: Medicare Other | Admitting: Emergency Medicine

## 2017-05-29 VITALS — BP 108/64 | HR 80 | Ht 63.0 in | Wt 119.0 lb

## 2017-05-29 DIAGNOSIS — R059 Cough, unspecified: Secondary | ICD-10-CM

## 2017-05-29 DIAGNOSIS — J449 Chronic obstructive pulmonary disease, unspecified: Secondary | ICD-10-CM

## 2017-05-29 DIAGNOSIS — R05 Cough: Secondary | ICD-10-CM

## 2017-05-29 MED ORDER — TIOTROPIUM BROMIDE MONOHYDRATE 2.5 MCG/ACT IN AERS: 2.0000 | INHALATION_SPRAY | Freq: Every day | RESPIRATORY_TRACT | 0 refills | Status: DC

## 2017-05-29 MED ORDER — ALBUTEROL SULFATE HFA 108 (90 BASE) MCG/ACT IN AERS: 2.0000 | INHALATION_SPRAY | RESPIRATORY_TRACT | 5 refills | Status: DC | PRN

## 2017-05-29 NOTE — Patient Instructions (Signed)
We will repeat your pulmonary function testing to compare with your priors. Please start Spiriva Respimat, 2 puffs once a day. Please keep track of your breathing symptoms on the new medication We will refill albuterol.  Please use 2 puffs as needed for chest tightness, shortness of breath, wheezing. We may decide to perform other workup to evaluate your dry cough as we go forward. Follow with Dr Lamonte Sakai in 1 month or next available with full PFT on the same day

## 2017-05-29 NOTE — Assessment & Plan Note (Signed)
Did not respond to empiric treatment for GERD several years ago.  She had been on medication for allergic rhinitis but is not currently.  She does not complain of significant congestion or drainage.  We may decide to expand the cough workup as we go forward.

## 2017-05-29 NOTE — Assessment & Plan Note (Signed)
She denies significant congestion, continues to have dry cough

## 2017-05-29 NOTE — Addendum Note (Signed)
Addended by: Desmond Dike C on: 05/29/2017 09:43 AM   Modules accepted: Orders

## 2017-05-29 NOTE — Assessment & Plan Note (Signed)
Slow progressive dyspnea.  Possible contribution of deconditioning but I suspect that this is an evolving manifestation of her COPD.  She has had some improvement when she uses albuterol.  We will start Spiriva, see if she benefits.  Perform pulmonary function testing to compare with priors.   We will repeat your pulmonary function testing to compare with your priors. Please start Spiriva Respimat, 2 puffs once a day. Please keep track of your breathing symptoms on the new medication We will refill albuterol.  Please use 2 puffs as needed for chest tightness, shortness of breath, wheezing.. Follow with Dr Lamonte Sakai in 1 month or next available with full PFT on the same day

## 2017-05-29 NOTE — Progress Notes (Signed)
  Subjective:    Patient ID: [name], female    DOB: April 30, 1946, 72 y.o.   MRN: [id number]  HPI Comments:  72 year old woman (8 pk-yrs) with history of coronary disease and mild COPD based on pulmonary function testing, chronic cough which is been persistently bothersome, likely exacerbated by allergic rhinitis.  She is been treated for GERD without much success.  She had an ENT evaluation and 2012 that showed a normal pharynx.  A CT scan of her chest back in 04/2009 did not show pulmonary embolism, some suggestion of COPD and some apical scarring. Last seen here 2 yrs ago. She has noticed an increase in exertional dyspnea. A cardiac perfusion study 05/08/17 was reassuring. She can walk at a regular pace, feels it with some house chores, stairs.   She still has a lot of cough, non-productive. Complains of minimal congestion.    Objective:   Physical Exam Vitals:   05/29/17 0910  BP: 108/64  Pulse: 80  SpO2: 93%   Vitals:   05/29/17 0910  BP: 108/64  Pulse: 80  SpO2: 93%  Weight: 119 lb (54 kg)  Height: 5\' 3"  (1.6 m)    Gen: Pleasant, well-nourished, in no distress,  normal affect  ENT: No lesions,  mouth clear,  oropharynx clear, no postnasal drip  Neck: No JVD, no stridor  Lungs: No use of accessory muscles, clear without rales or rhonchi  Chest wall nontender - no eccymosis noted.   No axillary adenopathy.   Cardiovascular: RRR, heart sounds normal, no murmur or gallops, no peripheral edema  Musculoskeletal: No deformities, no cyanosis or clubbing  Neuro: alert, non focal  Skin: Warm, no lesions or rashes     Assessment & Plan:  COPD (chronic obstructive pulmonary disease) Slow progressive dyspnea.  Possible contribution of deconditioning but I suspect that this is an evolving manifestation of her COPD.  She has had some improvement when she uses albuterol.  We will start Spiriva, see if she benefits.  Perform pulmonary function testing to compare with priors.    We will repeat your pulmonary function testing to compare with your priors. Please start Spiriva Respimat, 2 puffs once a day. Please keep track of your breathing symptoms on the new medication We will refill albuterol.  Please use 2 puffs as needed for chest tightness, shortness of breath, wheezing.. Follow with Dr Lamonte Sakai in 1 month or next available with full PFT on the same day  ALLERGIC RHINITIS She denies significant congestion, continues to have dry cough  COUGH, CHRONIC Did not respond to empiric treatment for GERD several years ago.  She had been on medication for allergic rhinitis but is not currently.  She does not complain of significant congestion or drainage.  We may decide to expand the cough workup as we go forward.

## 2017-06-02 ENCOUNTER — Telehealth: Payer: Self-pay | Admitting: Cardiology

## 2017-06-02 DIAGNOSIS — Z79899 Other long term (current) drug therapy: Secondary | ICD-10-CM

## 2017-06-02 NOTE — Telephone Encounter (Signed)
New message   Pt c/o medication issue:  1. Name of Medication: Crestor  2. How are you currently taking this medication (dosage and times per day)? As prescribed  3. Are you having a reaction (difficulty breathing--STAT)? No  4. What is your medication issue? Patient she feels better not taking medication. Has not taken in 2 weeks.

## 2017-06-03 NOTE — Telephone Encounter (Signed)
Returned pts call and she states after coming off the Crestor for 2 weeks, under advice of [place], [place], she does confirm that she feels much better off of it, that she doesn't feel as tired as she did.  Advised pt I would send a message to [place] and let her know what the next plan of care would be once we heard back.  Pt verbalized understanding.

## 2017-06-09 NOTE — Telephone Encounter (Signed)
F/U call:  Patient calling for update on recommendations

## 2017-06-11 MED ORDER — ATORVASTATIN CALCIUM 40 MG PO TABS: 40.0000 mg | ORAL_TABLET | Freq: Every day | ORAL | 1 refills | Status: DC

## 2017-06-11 NOTE — Telephone Encounter (Signed)
Ok to stop Crestor permanently given side effects. Will replace with Lipitor 40 mg which is the equivalent dose. Check FLP and HFTs in 6 weeks to insure cholesterol is well controlled.

## 2017-06-11 NOTE — Telephone Encounter (Signed)
Pt has been made aware of that she is to stay off the Crestor. She will replace with Atorvastatin 40 qd and recheck Lipid / LFT's 07/23/17. Pt agreeable with the plan and verbalized understanding.

## 2017-06-30 ENCOUNTER — Ambulatory Visit (INDEPENDENT_AMBULATORY_CARE_PROVIDER_SITE_OTHER): Payer: Medicare Other | Admitting: Emergency Medicine

## 2017-06-30 ENCOUNTER — Encounter: Payer: Self-pay | Admitting: Emergency Medicine

## 2017-06-30 DIAGNOSIS — J449 Chronic obstructive pulmonary disease, unspecified: Secondary | ICD-10-CM

## 2017-06-30 LAB — PULMONARY FUNCTION TEST
DL/VA % pred: 72 %
DL/VA: 3.41 ml/min/mmHg/L
DLCO unc % pred: 62 %
DLCO unc: 14.21 ml/min/mmHg
FEF 25-75 Post: 1.55 L/sec
FEF 25-75 Pre: 1.08 L/sec
FEF2575-%Change-Post: 44 %
FEF2575-%Pred-Post: 87 %
FEF2575-%Pred-Pre: 60 %
FEV1-%Change-Post: 10 %
FEV1-%Pred-Post: 101 %
FEV1-%Pred-Pre: 91 %
FEV1-Post: 2.16 L
FEV1-Pre: 1.95 L
FEV1FVC-%Change-Post: 4 %
FEV1FVC-%Pred-Pre: 85 %
FEV6-%Change-Post: 6 %
FEV6-%Pred-Post: 117 %
FEV6-%Pred-Pre: 110 %
FEV6-Post: 3.15 L
FEV6-Pre: 2.97 L
FEV6FVC-%Change-Post: 0 %
FEV6FVC-%Pred-Post: 105 %
FEV6FVC-%Pred-Pre: 104 %
FVC-%Change-Post: 5 %
FVC-%Pred-Post: 111 %
FVC-%Pred-Pre: 105 %
FVC-Post: 3.15 L
FVC-Pre: 2.99 L
Post FEV1/FVC ratio: 68 %
Post FEV6/FVC ratio: 100 %
Pre FEV1/FVC ratio: 65 %
Pre FEV6/FVC Ratio: 99 %
RV % pred: 102 %
RV: 2.22 L
TLC % pred: 104 %
TLC: 5.1 L

## 2017-06-30 NOTE — Progress Notes (Signed)
  Subjective:    Patient ID: [name], female    DOB: 12-14-1945, 72 y.o.   MRN: [id number]  HPI Comments:  72 year old woman (8 pk-yrs) with history of coronary disease and mild COPD based on pulmonary function testing, chronic cough which is been persistently bothersome, likely exacerbated by allergic rhinitis.  She is been treated for GERD without much success.  She had an ENT evaluation and 2012 that showed a normal pharynx.  A CT scan of her chest back in 04/2009 did not show pulmonary embolism, some suggestion of COPD and some apical scarring. Last seen here 2 yrs ago. She has noticed an increase in exertional dyspnea. A cardiac perfusion study 05/08/17 was reassuring. She can walk at a regular pace, feels it with some house chores, stairs.   She still has a lot of cough, non-productive. Complains of minimal congestion.   ROV 06/30/17 --this is a follow-up visit for evaluation of progressive dyspnea, patient with chronic cough in the setting of allergic rhinitis, possible GERD.  We have repeated pulmonary function testing on 2/26 that I have reviewed.  This shows mild obstruction with a borderline bronchodilator response, normal lung volumes, decreased diffusion capacity.  At her last visit we started Spiriva to see if she would benefit. She took for one month.    Objective:   Physical Exam Vitals:   06/30/17 1135  BP: 112/68  Pulse: 94  SpO2: 96%   Vitals:   06/30/17 1133 06/30/17 1135  BP:  112/68  Pulse:  94  SpO2:  96%  Weight: 119 lb (54 kg)   Height: 5\' 3"  (1.6 m)    Gen: Pleasant, well-nourished, in no distress,  normal affect  ENT: No lesions,  mouth clear,  oropharynx clear, no postnasal drip  Neck: No JVD, no stridor  Lungs: No use of accessory muscles, clear without rales or rhonchi  Cardiovascular: RRR, heart sounds normal, no murmur or gallops, no peripheral edema  Musculoskeletal: No deformities, no cyanosis or clubbing  Neuro: alert, non focal  Skin:  Warm, no lesions or rash     Assessment & Plan:  COPD (chronic obstructive pulmonary disease) She has emphysematous change by CT scan and chest x-ray but her pulmonary function testing is suggestive of an asthmatic type pattern.  She did believe that she benefited from Spiriva but it is not on her formulary.  Given her pulmonary function testing I like to switch her over to an ICS/laba to see if she gets more benefit.  We will try Brio 100.  I discussed with her the potential to increase her upper airway irritation and cough.  If this is problem we will need to adjust.  Also we will make adjustments based on her insurance formulary.  We will start Breo, 1 inhalation once a day.  Please rinse and gargle after taking this medication.  If you benefit then we will continue this medication.  Depending on your insurance formulary we may decide to adjust to an alternative. Depending on our progress and your cough we may decide to investigate the cough further. Follow with Dr Lamonte Sakai in 3 months or sooner if you have any problems.  Baltazar Apo, MD, PhD 06/30/2017, 11:57 AM [facility] [phone] or if no answer [phone]

## 2017-06-30 NOTE — Progress Notes (Signed)
PFT completed today 06/30/17

## 2017-06-30 NOTE — Patient Instructions (Signed)
We will start Breo, 1 inhalation once a day.  Please rinse and gargle after taking this medication.  If you benefit then we will continue this medication.  Depending on your insurance formulary we may decide to adjust to an alternative. Depending on our progress and your cough we may decide to investigate the cough further. Follow with Dr Lamonte Sakai in 3 months or sooner if you have any problems.

## 2017-06-30 NOTE — Assessment & Plan Note (Signed)
She has emphysematous change by CT scan and chest x-ray but her pulmonary function testing is suggestive of an asthmatic type pattern.  She did believe that she benefited from Spiriva but it is not on her formulary.  Given her pulmonary function testing I like to switch her over to an ICS/laba to see if she gets more benefit.  We will try Brio 100.  I discussed with her the potential to increase her upper airway irritation and cough.  If this is problem we will need to adjust.  Also we will make adjustments based on her insurance formulary.  We will start Breo, 1 inhalation once a day.  Please rinse and gargle after taking this medication.  If you benefit then we will continue this medication.  Depending on your insurance formulary we may decide to adjust to an alternative. Depending on our progress and your cough we may decide to investigate the cough further. Follow with Dr Lamonte Sakai in 3 months or sooner if you have any problems.

## 2017-07-01 DIAGNOSIS — M810 Age-related osteoporosis without current pathological fracture: Secondary | ICD-10-CM | POA: Diagnosis not present

## 2017-07-01 DIAGNOSIS — Z780 Asymptomatic menopausal state: Secondary | ICD-10-CM | POA: Diagnosis not present

## 2017-07-01 DIAGNOSIS — Z5181 Encounter for therapeutic drug level monitoring: Secondary | ICD-10-CM | POA: Diagnosis not present

## 2017-07-06 DIAGNOSIS — M810 Age-related osteoporosis without current pathological fracture: Secondary | ICD-10-CM | POA: Diagnosis not present

## 2017-07-13 ENCOUNTER — Other Ambulatory Visit: Payer: Self-pay | Admitting: *Deleted

## 2017-07-13 ENCOUNTER — Other Ambulatory Visit: Payer: Self-pay

## 2017-07-13 MED ORDER — ATORVASTATIN CALCIUM 40 MG PO TABS: 40.0000 mg | ORAL_TABLET | Freq: Every day | ORAL | 2 refills | Status: DC

## 2017-07-13 NOTE — Telephone Encounter (Signed)
Rx has been sent to the pharmacy electronically. ° °

## 2017-07-23 ENCOUNTER — Other Ambulatory Visit: Payer: Medicare Other

## 2017-07-27 ENCOUNTER — Other Ambulatory Visit: Payer: Self-pay | Admitting: Cardiovascular Disease

## 2017-07-27 DIAGNOSIS — I2510 Atherosclerotic heart disease of native coronary artery without angina pectoris: Secondary | ICD-10-CM

## 2017-07-29 ENCOUNTER — Other Ambulatory Visit: Payer: Medicare Other | Admitting: *Deleted

## 2017-07-29 ENCOUNTER — Other Ambulatory Visit: Payer: Self-pay | Admitting: Cardiology

## 2017-07-29 DIAGNOSIS — Z79899 Other long term (current) drug therapy: Secondary | ICD-10-CM

## 2017-07-29 LAB — HEPATIC FUNCTION PANEL
ALT: 23 [IU]/L (ref 0–32)
AST: 25 [IU]/L (ref 0–40)
Albumin: 4.2 g/dL (ref 3.5–4.8)
Alkaline Phosphatase: 68 [IU]/L (ref 39–117)
Bilirubin Total: 0.3 mg/dL (ref 0.0–1.2)
Bilirubin, Direct: 0.1 mg/dL (ref 0.00–0.40)
Total Protein: 6.8 g/dL (ref 6.0–8.5)

## 2017-07-29 LAB — LIPID PANEL
Chol/HDL Ratio: 2 ratio (ref 0.0–4.4)
Cholesterol, Total: 135 mg/dL (ref 100–199)
HDL: 67 mg/dL
LDL Calculated: 50 mg/dL (ref 0–99)
Triglycerides: 90 mg/dL (ref 0–149)
VLDL Cholesterol Cal: 18 mg/dL (ref 5–40)

## 2017-08-06 MED ORDER — ATORVASTATIN CALCIUM 40 MG PO TABS: 40.0000 mg | ORAL_TABLET | Freq: Every day | ORAL | 2 refills | Status: DC

## 2017-08-06 NOTE — Addendum Note (Signed)
Addended by: Derl Barrow on: 08/06/2017 04:38 PM   Modules accepted: Orders

## 2017-08-13 ENCOUNTER — Telehealth: Payer: Self-pay | Admitting: Emergency Medicine

## 2017-08-13 MED ORDER — FLUTICASONE FUROATE-VILANTEROL 100-25 MCG/INH IN AEPB: 1.0000 | INHALATION_SPRAY | Freq: Every day | RESPIRATORY_TRACT | 4 refills | Status: DC

## 2017-08-13 NOTE — Telephone Encounter (Signed)
Patient returned call and states it should be Breo 100.  CB is [phone] if any questions.

## 2017-08-13 NOTE — Telephone Encounter (Signed)
Medication has been sent into the pharmacy for patient,.   Nothing else needed at time of call.

## 2017-08-13 NOTE — Telephone Encounter (Signed)
Spoke with patient. She is requesting to have Breo sent into her pharmacy. Asked patient which strength she was using, she stated that she would have to call back since she was not at home.   Will await patient's call back since it wasn't documented if she was supposed to be Breo 100 or Breo 200.

## 2017-08-14 DIAGNOSIS — D1801 Hemangioma of skin and subcutaneous tissue: Secondary | ICD-10-CM | POA: Diagnosis not present

## 2017-08-14 DIAGNOSIS — L570 Actinic keratosis: Secondary | ICD-10-CM | POA: Diagnosis not present

## 2017-08-14 DIAGNOSIS — L812 Freckles: Secondary | ICD-10-CM | POA: Diagnosis not present

## 2017-08-14 DIAGNOSIS — D2271 Melanocytic nevi of right lower limb, including hip: Secondary | ICD-10-CM | POA: Diagnosis not present

## 2017-08-14 DIAGNOSIS — L820 Inflamed seborrheic keratosis: Secondary | ICD-10-CM | POA: Diagnosis not present

## 2017-08-14 DIAGNOSIS — D225 Melanocytic nevi of trunk: Secondary | ICD-10-CM | POA: Diagnosis not present

## 2017-08-14 DIAGNOSIS — L821 Other seborrheic keratosis: Secondary | ICD-10-CM | POA: Diagnosis not present

## 2017-10-12 ENCOUNTER — Encounter: Payer: Self-pay | Admitting: Emergency Medicine

## 2017-10-12 ENCOUNTER — Ambulatory Visit (INDEPENDENT_AMBULATORY_CARE_PROVIDER_SITE_OTHER): Payer: Medicare Other | Admitting: Emergency Medicine

## 2017-10-12 DIAGNOSIS — J449 Chronic obstructive pulmonary disease, unspecified: Secondary | ICD-10-CM

## 2017-10-12 DIAGNOSIS — I2510 Atherosclerotic heart disease of native coronary artery without angina pectoris: Secondary | ICD-10-CM | POA: Diagnosis not present

## 2017-10-12 DIAGNOSIS — J301 Allergic rhinitis due to pollen: Secondary | ICD-10-CM | POA: Diagnosis not present

## 2017-10-12 NOTE — Assessment & Plan Note (Signed)
Not currently on medications although she has been on immunotherapy before.  She had several weeks of more cough and dyspnea this spring, unclear whether this was a URI versus allergies.  She did not had to be treated for an exacerbation.  We talked today about going back on an antihistamine during the spring and fall months.  You might benefit from adding loratadine (Claritin) 10 mg daily during the spring and fall months. Follow with Dr Lamonte Sakai in 6 months or sooner if you have any problems

## 2017-10-12 NOTE — Progress Notes (Signed)
Subjective:    Patient ID: [name], female    DOB: [birth year], 72 y.o.   MRN: [id number]  HPI Comments:  72 year old woman (8 pk-yrs) with history of coronary disease and mild COPD based on pulmonary function testing, chronic cough which is been persistently bothersome, likely exacerbated by allergic rhinitis.  She is been treated for GERD without much success.  She had an ENT evaluation and 2012 that showed a normal pharynx.  A CT scan of her chest back in 04/2009 did not show pulmonary embolism, some suggestion of COPD and some apical scarring. Last seen here 2 yrs ago. She has noticed an increase in exertional dyspnea. A cardiac perfusion study 05/08/17 was reassuring. She can walk at a regular pace, feels it with some house chores, stairs.   She still has a lot of cough, non-productive. Complains of minimal congestion.   ROV 06/30/17 --this is a follow-up visit for evaluation of progressive dyspnea, patient with chronic cough in the setting of allergic rhinitis, possible GERD.  We have repeated pulmonary function testing on 2/26 that I have reviewed.  This shows mild obstruction with a borderline bronchodilator response, normal lung volumes, decreased diffusion capacity.  At her last visit we started Spiriva to see if she would benefit. She took for one month.   ROV 10/12/17 --72 year old woman who follows up today for dyspnea and chronic cough in the setting of allergic rhinitis and suspected GERD.  She has plenty function testing that showed evidence for some obstructive lung disease so at her last visit we started Breo to see if she would benefit. She is off Spiriva. She isn't really sure that it is helping her. She still has some dyspnea with stairs, any exertion. She can shop without noticing it. She tells me that a few months ago she had a URI, had more cough and SOB. Now improved. She uses albuterol rarely. Not currently on allergy medication.    Objective:   Physical Exam Vitals:   10/12/17 1033  BP: 118/70  Pulse: 68  SpO2: 94%   Vitals:   10/12/17 1033  BP: 118/70  Pulse: 68  SpO2: 94%  Weight: 117 lb (53.1 kg)  Height: 5' 2.5" (1.588 m)   Gen: Pleasant, well-nourished, in no distress,  normal affect  ENT: No lesions,  mouth clear,  oropharynx clear, no postnasal drip  Neck: No JVD, no stridor  Lungs: No use of accessory muscles, clear without rales or rhonchi  Cardiovascular: RRR, heart sounds normal, no murmur or gallops, no peripheral edema  Musculoskeletal: No deformities, no cyanosis or clubbing  Neuro: alert, non focal  Skin: Warm, no lesions or rash     Assessment & Plan:  COPD (chronic obstructive pulmonary disease) Please continue Breo once a day as you have been taking it.  Remember to rinse and gargle after using.  Keep albuterol available to use 2 puffs if needed for shortness of breath, chest tightness, wheezing. You might benefit from adding loratadine (Claritin) 10 mg daily during the spring and fall months. Follow with Dr Lamonte Sakai in 6 months or sooner if you have any problems  Allergic rhinitis Not currently on medications although she has been on immunotherapy before.  She had several weeks of more cough and dyspnea this spring, unclear whether this was a URI versus allergies.  She did not had to be treated for an exacerbation.  We talked today about going back on an antihistamine during the spring and fall months.  You might  benefit from adding loratadine (Claritin) 10 mg daily during the spring and fall months. Follow with Dr Lamonte Sakai in 6 months or sooner if you have any problems  Baltazar Apo, MD, PhD 10/12/2017, 10:49 AM [facility] [phone] or if no answer [phone]

## 2017-10-12 NOTE — Patient Instructions (Signed)
Please continue Breo once a day as you have been taking it.  Remember to rinse and gargle after using.  Keep albuterol available to use 2 puffs if needed for shortness of breath, chest tightness, wheezing. You might benefit from adding loratadine (Claritin) 10 mg daily during the spring and fall months. Follow with Dr Lamonte Sakai in 6 months or sooner if you have any problems

## 2017-10-12 NOTE — Assessment & Plan Note (Signed)
Please continue Breo once a day as you have been taking it.  Remember to rinse and gargle after using.  Keep albuterol available to use 2 puffs if needed for shortness of breath, chest tightness, wheezing. You might benefit from adding loratadine (Claritin) 10 mg daily during the spring and fall months. Follow with Dr Lamonte Sakai in 6 months or sooner if you have any problems

## 2017-11-18 DIAGNOSIS — Z1231 Encounter for screening mammogram for malignant neoplasm of breast: Secondary | ICD-10-CM | POA: Diagnosis not present

## 2017-11-18 DIAGNOSIS — Z6821 Body mass index (BMI) 21.0-21.9, adult: Secondary | ICD-10-CM | POA: Diagnosis not present

## 2017-11-18 DIAGNOSIS — Z1389 Encounter for screening for other disorder: Secondary | ICD-10-CM | POA: Diagnosis not present

## 2017-11-18 DIAGNOSIS — N941 Unspecified dyspareunia: Secondary | ICD-10-CM | POA: Diagnosis not present

## 2017-11-18 DIAGNOSIS — Z01419 Encounter for gynecological examination (general) (routine) without abnormal findings: Secondary | ICD-10-CM | POA: Diagnosis not present

## 2017-11-18 DIAGNOSIS — F419 Anxiety disorder, unspecified: Secondary | ICD-10-CM | POA: Diagnosis not present

## 2017-11-18 DIAGNOSIS — Z124 Encounter for screening for malignant neoplasm of cervix: Secondary | ICD-10-CM | POA: Diagnosis not present

## 2017-12-11 ENCOUNTER — Ambulatory Visit (INDEPENDENT_AMBULATORY_CARE_PROVIDER_SITE_OTHER): Payer: Medicare Other | Admitting: Cardiovascular Disease

## 2017-12-11 ENCOUNTER — Encounter: Payer: Self-pay | Admitting: Cardiovascular Disease

## 2017-12-11 VITALS — BP 120/70 | HR 75 | Ht 62.0 in | Wt 117.4 lb

## 2017-12-11 DIAGNOSIS — I25110 Atherosclerotic heart disease of native coronary artery with unstable angina pectoris: Secondary | ICD-10-CM

## 2017-12-11 MED ORDER — METOPROLOL SUCCINATE ER 25 MG PO TB24: 25.0000 mg | ORAL_TABLET | Freq: Every day | ORAL | 11 refills | Status: DC

## 2017-12-11 NOTE — Patient Instructions (Addendum)
Medication Instructions:  Your physician has recommended you make the following change in your medication:  Start Toprol 25 mg by mouth daily   Labwork: Lab work to be done today--BMP and CBC  Testing/Procedures: Your physician has requested that you have a cardiac catheterization. Cardiac catheterization is used to diagnose and/or treat various heart conditions. Doctors may recommend this procedure for a number of different reasons. The most common reason is to evaluate chest pain. Chest pain can be a symptom of coronary artery disease (CAD), and cardiac catheterization can show whether plaque is narrowing or blocking your heart's arteries. This procedure is also used to evaluate the valves, as well as measure the blood flow and oxygen levels in different parts of your heart. For further information please visit [email or web address]. Please follow instruction sheet, as given.  Scheduled for August 16,2019  Follow-Up: Your physician recommends that you schedule a follow-up appointment in: about 4 weeks with PA or NP    Any Other Special Instructions Will Be Listed Below (If Applicable).     [place] OFFICE [place], [place] [address] Dept: [phone] Loc: [place]  12/11/2017  You are scheduled for a Cardiac Catheterization on Friday, August 16 with Dr. Lauree Chandler.  1. Please arrive at the [facility] (Main Entrance A) at [facility]: [address] [place], [place] [address] at 10:00 AM (This time is two hours before your procedure to ensure your preparation). Free valet parking service is available.   Special note: Every effort is made to have your procedure done on time. Please understand that emergencies sometimes delay scheduled procedures.  2. Diet: Do not eat solid foods after midnight.  The patient may have clear liquids until 5am upon  the day of the procedure.  3. Labs: Lab work was done in office on August 9,2019   4. Medication instructions in preparation for your procedure:   Contrast Allergy: No   On the morning of your procedure, take your Aspirin and Clopidogel and any morning medicines NOT listed above.  You may use sips of water.  5. Plan for one night stay--bring personal belongings. 6. Bring a current list of your medications and current insurance cards. 7. You MUST have a responsible person to drive you home. 8. Someone MUST be with you the first 24 hours after you arrive home or your discharge will be delayed. 9. Please wear clothes that are easy to get on and off and wear slip-on shoes.  Thank you for allowing [place] to care for you!   -- [facility]    If you need a refill on your cardiac medications before your next appointment, please call your pharmacy.

## 2017-12-11 NOTE — H&P (View-Only) (Signed)
Chief Complaint  Patient presents with  . Follow-up    CAD   History of Present Illness: 72 yo female with history of CAD, COPD, GERD here today for cardiac follow up. She had a NSTEMI in 2012 and was found to have severe disease in the mid LAD and mid RCA. A drug eluting stent was placed in the mid LAD and a drug eluting stent was placed in the mid RCA. Echo February 2018 with normal LV size and function. She called our office with c/o palpitations 10/07/16. Cardiac monitor June 2018 with sinus rhythm, rare PVCs, frequent PACs with several 6 beat runs of SVT. Nuclear stress test in January 2019 with no ischemia. This was arranged given c/o of dyspnea and fatigue. She tried stopping Crestor to see if this helped wit her fatigue  She followed up in the pulmonary office and is felt to have mild COPD.   She is here today for follow up. She describes several episodes of chest pain. Mostly at rest. No exertional chest pain. The pain lasted for a few minutes. She has continued dyspnea. No palpitations, lower extremity edema, orthopnea, PND, dizziness, near syncope or syncope.   Primary Care Physician: Glenford Bayley, DO  Past Medical History:  Diagnosis Date  . Allergy   . Arthritis   . COPD (chronic obstructive pulmonary disease) ([place])   . Coronary artery disease August 2012   Drug eluting stent mid LAD, drug eluting stent mid RCA  . Depression   . Emphysema   . Heart attack [facility]) August 2012  . Migraine   . Osteoporosis   . TIA (transient ischemic attack) 1995    Past Surgical History:  Procedure Laterality Date  . APPENDECTOMY  01/1956  . CATARACT EXTRACTION    . CORONARY ANGIOPLASTY WITH STENT PLACEMENT  2012  . FOOT SURGERY Bilateral   . HAND SURGERY Left   . NASAL SEPTUM SURGERY    . SHOULDER SURGERY Left    frozen shoulder  . THORACOTOMY    . TONSILLECTOMY      Current Outpatient Medications  Medication Sig Dispense Refill  . albuterol (PROAIR HFA) 108 (90 Base) MCG/ACT  inhaler Inhale 2 puffs into the lungs every 4 (four) hours as needed for wheezing or shortness of breath. 1 Inhaler 5  . aspirin EC 81 MG tablet Take 81 mg by mouth daily.    Marland Kitchen atorvastatin (LIPITOR) 40 MG tablet Take 1 tablet (40 mg total) by mouth daily. 90 tablet 2  . Cholecalciferol (VITAMIN D) 2000 UNITS CAPS Take 2,000 Units by mouth daily.     . clopidogrel (PLAVIX) 75 MG tablet TAKE 1 TABLET BY MOUTH EVERY DAY 90 tablet 2  . denosumab (PROLIA) 60 MG/ML SOLN injection Inject 60 mg into the skin every 6 (six) months. Administer in upper arm, thigh, or abdomen    . fluticasone furoate-vilanterol (BREO ELLIPTA) 100-25 MCG/INH AEPB Inhale 1 puff into the lungs daily. 1 each 4  . nitroGLYCERIN (NITROSTAT) 0.4 MG SL tablet PLACE 1 TABLET (0.4 MG TOTAL) UNDER THE TONGUE EVERY 5 (FIVE) MINUTES AS NEEDED FOR CHEST PAIN. 25 tablet 5  . sertraline (ZOLOFT) 100 MG tablet Take 100 mg by mouth daily.    Marland Kitchen topiramate (TOPAMAX) 100 MG tablet Take 100 mg by mouth daily.     . Estradiol 4 MCG INST Place 1 Dose vaginally 2 (two) times a week.    . metoprolol succinate (TOPROL XL) 25 MG 24 hr tablet Take  1 tablet (25 mg total) by mouth daily. 30 tablet 11  . zolpidem (AMBIEN) 10 MG tablet Take 1 tablet by mouth as needed for sleep.     Current Facility-Administered Medications  Medication Dose Route Frequency Provider Last Rate Last Dose  . 0.9 %  sodium chloride infusion  500 mL Intravenous Continuous Irene Shipper, MD        Allergies  Allergen Reactions  . Naproxen Hives  . Parabens     PT IS NOT SURE OF REACTION  . Estradiol Hives  . Tramadol Hcl Nausea And Vomiting    Social History   Socioeconomic History  . Marital status: Married    Spouse name: Not on file  . Number of children: 2  . Years of education: Not on file  . Highest education level: Not on file  Occupational History  . Occupation: retired  Scientific laboratory technician  . Financial resource strain: Not on file  . Food insecurity:     Worry: Not on file    Inability: Not on file  . Transportation needs:    Medical: Not on file    Non-medical: Not on file  Tobacco Use  . Smoking status: Former Smoker    Packs/day: 1.00    Years: 10.00    Pack years: 10.00    Types: Cigarettes    Last attempt to quit: 05/06/1971    Years since quitting: 46.6  . Smokeless tobacco: Never Used  Substance and Sexual Activity  . Alcohol use: No  . Drug use: No  . Sexual activity: Not on file  Lifestyle  . Physical activity:    Days per week: Not on file    Minutes per session: Not on file  . Stress: Not on file  Relationships  . Social connections:    Talks on phone: Not on file    Gets together: Not on file    Attends religious service: Not on file    Active member of club or organization: Not on file    Attends meetings of clubs or organizations: Not on file    Relationship status: Not on file  . Intimate partner violence:    Fear of current or ex partner: Not on file    Emotionally abused: Not on file    Physically abused: Not on file    Forced sexual activity: Not on file  Other Topics Concern  . Not on file  Social History Narrative  . Not on file    Family History  Problem Relation Age of Onset  . Heart disease Paternal Grandmother   . Heart disease Paternal Grandfather   . Rheum arthritis Maternal Grandmother   . Diabetes Maternal Grandmother   . Rheum arthritis Maternal Grandfather   . Lung cancer Maternal Uncle   . Diabetes Mother   . Diabetes Maternal Aunt     Review of Systems:  As stated in the HPI and otherwise negative.   BP 120/70   Pulse 75   Ht 5\' 2"  (1.575 m)   Wt 117 lb 6.4 oz (53.3 kg)   SpO2 90%   BMI 21.47 kg/m   Physical Examination: General: Well developed, well nourished, NAD  HEENT: OP clear, mucus membranes moist  SKIN: warm, dry. No rashes. Neuro: No focal deficits  Musculoskeletal: Muscle strength 5/5 all ext  Psychiatric: Mood and affect normal  Neck: No JVD, no carotid  bruits, no thyromegaly, no lymphadenopathy.  Lungs:Clear bilaterally, no wheezes, rhonci, crackles Cardiovascular: Regular rate and  rhythm. No murmurs, gallops or rubs. Abdomen:Soft. Bowel sounds present. Non-tender.  Extremities: No lower extremity edema. Pulses are 2 + in the bilateral DP/PT.  Echo February 2018: Left ventricle: The cavity size was normal. Wall thickness was   normal. Systolic function was normal. The estimated ejection   fraction was in the range of 55% to 60%. Doppler parameters are   consistent with abnormal left ventricular relaxation (grade 1   diastolic dysfunction).  EKG:  EKG is notordered today. The ekg ordered today demonstrates   Recent Labs: 07/29/2017: ALT 23   Lipid Panel Primary care:    Wt Readings from Last 3 Encounters:  12/11/17 117 lb 6.4 oz (53.3 kg)  10/12/17 117 lb (53.1 kg)  06/30/17 119 lb (54 kg)     Other studies Reviewed: Additional studies/ records that were reviewed today include: . Review of the above records demonstrates:    Assessment and Plan:   1. CAD with unstable angina:  She is now having chest pains and worsened dyspnea. Will plan cardiac cath at [facility] on 12/18/17 at noon.  I have reviewed the risks, indications, and alternatives to cardiac catheterization, possible angioplasty, and stenting with the patient. Risks include but are not limited to bleeding, infection, vascular injury, stroke, myocardial infection, arrhythmia, kidney injury, radiation-related injury in the case of prolonged fluoroscopy use, emergency cardiac surgery, and death. The patient understands the risks of serious complication is 1-2 in 4709 with diagnostic cardiac cath and 1-2% or less with angioplasty/stenting. -Continue ASA, Plavix and statin. Start Toprol 25 mg daily.   2. Premature atrial contractions/SVT: Rare palpitations  Current medicines are reviewed at length with the patient today.  The patient does not have concerns regarding  medicines.  The following changes have been made:  no change  Labs/ tests ordered today include:   Orders Placed This Encounter  Procedures  . CBC  . Basic Metabolic Panel (BMET)    Disposition:   FU with me or care team APP at [facility] street office in one month.   Signed, Lauree Chandler, MD 12/11/2017 1:13 PM    [facility] [address], [place], [place]  [address] Phone: [phone]; Fax: [phone]

## 2017-12-11 NOTE — Progress Notes (Signed)
Chief Complaint  Patient presents with  . Follow-up    CAD   History of Present Illness: 72 yo female with history of CAD, COPD, GERD here today for cardiac follow up. She had a NSTEMI in 2012 and was found to have severe disease in the mid LAD and mid RCA. A drug eluting stent was placed in the mid LAD and a drug eluting stent was placed in the mid RCA. Echo February 2018 with normal LV size and function. She called our office with c/o palpitations 10/07/16. Cardiac monitor June 2018 with sinus rhythm, rare PVCs, frequent PACs with several 6 beat runs of SVT. Nuclear stress test in January 2019 with no ischemia. This was arranged given c/o of dyspnea and fatigue. She tried stopping Crestor to see if this helped wit her fatigue  She followed up in the pulmonary office and is felt to have mild COPD.   She is here today for follow up. She describes several episodes of chest pain. Mostly at rest. No exertional chest pain. The pain lasted for a few minutes. She has continued dyspnea. No palpitations, lower extremity edema, orthopnea, PND, dizziness, near syncope or syncope.   Primary Care Physician: Glenford Bayley, DO  Past Medical History:  Diagnosis Date  . Allergy   . Arthritis   . COPD (chronic obstructive pulmonary disease) ([place])   . Coronary artery disease August 2012   Drug eluting stent mid LAD, drug eluting stent mid RCA  . Depression   . Emphysema   . Heart attack [facility]) August 2012  . Migraine   . Osteoporosis   . TIA (transient ischemic attack) 1995    Past Surgical History:  Procedure Laterality Date  . APPENDECTOMY  01/1956  . CATARACT EXTRACTION    . CORONARY ANGIOPLASTY WITH STENT PLACEMENT  2012  . FOOT SURGERY Bilateral   . HAND SURGERY Left   . NASAL SEPTUM SURGERY    . SHOULDER SURGERY Left    frozen shoulder  . THORACOTOMY    . TONSILLECTOMY      Current Outpatient Medications  Medication Sig Dispense Refill  . albuterol (PROAIR HFA) 108 (90 Base) MCG/ACT  inhaler Inhale 2 puffs into the lungs every 4 (four) hours as needed for wheezing or shortness of breath. 1 Inhaler 5  . aspirin EC 81 MG tablet Take 81 mg by mouth daily.    Marland Kitchen atorvastatin (LIPITOR) 40 MG tablet Take 1 tablet (40 mg total) by mouth daily. 90 tablet 2  . Cholecalciferol (VITAMIN D) 2000 UNITS CAPS Take 2,000 Units by mouth daily.     . clopidogrel (PLAVIX) 75 MG tablet TAKE 1 TABLET BY MOUTH EVERY DAY 90 tablet 2  . denosumab (PROLIA) 60 MG/ML SOLN injection Inject 60 mg into the skin every 6 (six) months. Administer in upper arm, thigh, or abdomen    . fluticasone furoate-vilanterol (BREO ELLIPTA) 100-25 MCG/INH AEPB Inhale 1 puff into the lungs daily. 1 each 4  . nitroGLYCERIN (NITROSTAT) 0.4 MG SL tablet PLACE 1 TABLET (0.4 MG TOTAL) UNDER THE TONGUE EVERY 5 (FIVE) MINUTES AS NEEDED FOR CHEST PAIN. 25 tablet 5  . sertraline (ZOLOFT) 100 MG tablet Take 100 mg by mouth daily.    Marland Kitchen topiramate (TOPAMAX) 100 MG tablet Take 100 mg by mouth daily.     . Estradiol 4 MCG INST Place 1 Dose vaginally 2 (two) times a week.    . metoprolol succinate (TOPROL XL) 25 MG 24 hr tablet Take  1 tablet (25 mg total) by mouth daily. 30 tablet 11  . zolpidem (AMBIEN) 10 MG tablet Take 1 tablet by mouth as needed for sleep.     Current Facility-Administered Medications  Medication Dose Route Frequency Provider Last Rate Last Dose  . 0.9 %  sodium chloride infusion  500 mL Intravenous Continuous Irene Shipper, MD        Allergies  Allergen Reactions  . Naproxen Hives  . Parabens     PT IS NOT SURE OF REACTION  . Estradiol Hives  . Tramadol Hcl Nausea And Vomiting    Social History   Socioeconomic History  . Marital status: Married    Spouse name: Not on file  . Number of children: 2  . Years of education: Not on file  . Highest education level: Not on file  Occupational History  . Occupation: retired  Scientific laboratory technician  . Financial resource strain: Not on file  . Food insecurity:     Worry: Not on file    Inability: Not on file  . Transportation needs:    Medical: Not on file    Non-medical: Not on file  Tobacco Use  . Smoking status: Former Smoker    Packs/day: 1.00    Years: 10.00    Pack years: 10.00    Types: Cigarettes    Last attempt to quit: 05/06/1971    Years since quitting: 46.6  . Smokeless tobacco: Never Used  Substance and Sexual Activity  . Alcohol use: No  . Drug use: No  . Sexual activity: Not on file  Lifestyle  . Physical activity:    Days per week: Not on file    Minutes per session: Not on file  . Stress: Not on file  Relationships  . Social connections:    Talks on phone: Not on file    Gets together: Not on file    Attends religious service: Not on file    Active member of club or organization: Not on file    Attends meetings of clubs or organizations: Not on file    Relationship status: Not on file  . Intimate partner violence:    Fear of current or ex partner: Not on file    Emotionally abused: Not on file    Physically abused: Not on file    Forced sexual activity: Not on file  Other Topics Concern  . Not on file  Social History Narrative  . Not on file    Family History  Problem Relation Age of Onset  . Heart disease Paternal Grandmother   . Heart disease Paternal Grandfather   . Rheum arthritis Maternal Grandmother   . Diabetes Maternal Grandmother   . Rheum arthritis Maternal Grandfather   . Lung cancer Maternal Uncle   . Diabetes Mother   . Diabetes Maternal Aunt     Review of Systems:  As stated in the HPI and otherwise negative.   BP 120/70   Pulse 75   Ht 5\' 2"  (1.575 m)   Wt 117 lb 6.4 oz (53.3 kg)   SpO2 90%   BMI 21.47 kg/m   Physical Examination: General: Well developed, well nourished, NAD  HEENT: OP clear, mucus membranes moist  SKIN: warm, dry. No rashes. Neuro: No focal deficits  Musculoskeletal: Muscle strength 5/5 all ext  Psychiatric: Mood and affect normal  Neck: No JVD, no carotid  bruits, no thyromegaly, no lymphadenopathy.  Lungs:Clear bilaterally, no wheezes, rhonci, crackles Cardiovascular: Regular rate and  rhythm. No murmurs, gallops or rubs. Abdomen:Soft. Bowel sounds present. Non-tender.  Extremities: No lower extremity edema. Pulses are 2 + in the bilateral DP/PT.  Echo February 2018: Left ventricle: The cavity size was normal. Wall thickness was   normal. Systolic function was normal. The estimated ejection   fraction was in the range of 55% to 60%. Doppler parameters are   consistent with abnormal left ventricular relaxation (grade 1   diastolic dysfunction).  EKG:  EKG is notordered today. The ekg ordered today demonstrates   Recent Labs: 07/29/2017: ALT 23   Lipid Panel Primary care:    Wt Readings from Last 3 Encounters:  12/11/17 117 lb 6.4 oz (53.3 kg)  10/12/17 117 lb (53.1 kg)  06/30/17 119 lb (54 kg)     Other studies Reviewed: Additional studies/ records that were reviewed today include: . Review of the above records demonstrates:    Assessment and Plan:   1. CAD with unstable angina:  She is now having chest pains and worsened dyspnea. Will plan cardiac cath at [facility] on 12/18/17 at noon.  I have reviewed the risks, indications, and alternatives to cardiac catheterization, possible angioplasty, and stenting with the patient. Risks include but are not limited to bleeding, infection, vascular injury, stroke, myocardial infection, arrhythmia, kidney injury, radiation-related injury in the case of prolonged fluoroscopy use, emergency cardiac surgery, and death. The patient understands the risks of serious complication is 1-2 in 8250 with diagnostic cardiac cath and 1-2% or less with angioplasty/stenting. -Continue ASA, Plavix and statin. Start Toprol 25 mg daily.   2. Premature atrial contractions/SVT: Rare palpitations  Current medicines are reviewed at length with the patient today.  The patient does not have concerns regarding  medicines.  The following changes have been made:  no change  Labs/ tests ordered today include:   Orders Placed This Encounter  Procedures  . CBC  . Basic Metabolic Panel (BMET)    Disposition:   FU with me or care team APP at [facility] street office in one month.   Signed, Lauree Chandler, MD 12/11/2017 1:13 PM    [facility] [place], [place], [place]  [address] Phone: [phone]; Fax: [phone]

## 2017-12-12 LAB — CBC
HEMATOCRIT: 44.5 % (ref 34.0–46.6)
HEMOGLOBIN: 14 g/dL (ref 11.1–15.9)
MCH: 28.3 pg (ref 26.6–33.0)
MCHC: 31.5 g/dL (ref 31.5–35.7)
MCV: 90 fL (ref 79–97)
Platelets: 300 10*3/uL (ref 150–450)
RBC: 4.94 x10E6/uL (ref 3.77–5.28)
RDW: 14.5 % (ref 12.3–15.4)
WBC: 6.6 10*3/uL (ref 3.4–10.8)

## 2017-12-12 LAB — BASIC METABOLIC PANEL
BUN/Creatinine Ratio: 15 (ref 12–28)
BUN: 13 mg/dL (ref 8–27)
CALCIUM: 9.7 mg/dL (ref 8.7–10.3)
CO2: 21 mmol/L (ref 20–29)
CREATININE: 0.86 mg/dL (ref 0.57–1.00)
Chloride: 106 mmol/L (ref 96–106)
GFR, EST AFRICAN AMERICAN: 79 mL/min/{1.73_m2} (ref >59)
GFR, EST NON AFRICAN AMERICAN: 68 mL/min/{1.73_m2} (ref >59)
Glucose: 85 mg/dL (ref 65–99)
POTASSIUM: 4 mmol/L (ref 3.5–5.2)
Sodium: 140 mmol/L (ref 134–144)

## 2017-12-17 ENCOUNTER — Telehealth: Payer: Self-pay | Admitting: *Deleted

## 2017-12-17 NOTE — Telephone Encounter (Signed)
Pt contacted pre-catheterization scheduled at [facility] for: Friday August 16,2019 12 noon Verified arrival time and place: [place] Entrance A at: 10 AM  No solid food after midnight prior to cath, clear liquids until 5 AM day of procedure. Verified no contrast allergy Verified no diabetes medications.  AM meds can be  taken pre-cath with sip of water including: ASA 81 mg Clopidogrel 75 mg  Confirmed patient has responsible person to drive home post procedure and for 24 hours after you arrive home: yes

## 2017-12-18 ENCOUNTER — Encounter (HOSPITAL_COMMUNITY)
Admission: RE | Disposition: A | Discharge status: Home / Self Care | Payer: Self-pay | Source: Ambulatory Visit | Attending: Cardiovascular Disease

## 2017-12-18 ENCOUNTER — Ambulatory Visit (HOSPITAL_COMMUNITY)
Admission: RE | Admit: 2017-12-18 | Discharge: 2017-12-18 | Disposition: A | Discharge status: Home / Self Care | Payer: Medicare Other | Source: Ambulatory Visit | Attending: Cardiovascular Disease | Admitting: Cardiovascular Disease

## 2017-12-18 ENCOUNTER — Other Ambulatory Visit: Payer: Self-pay

## 2017-12-18 DIAGNOSIS — I25118 Atherosclerotic heart disease of native coronary artery with other forms of angina pectoris: Secondary | ICD-10-CM | POA: Insufficient documentation

## 2017-12-18 DIAGNOSIS — J449 Chronic obstructive pulmonary disease, unspecified: Secondary | ICD-10-CM | POA: Insufficient documentation

## 2017-12-18 DIAGNOSIS — Z955 Presence of coronary angioplasty implant and graft: Secondary | ICD-10-CM | POA: Insufficient documentation

## 2017-12-18 DIAGNOSIS — Z7902 Long term (current) use of antithrombotics/antiplatelets: Secondary | ICD-10-CM | POA: Insufficient documentation

## 2017-12-18 DIAGNOSIS — G43909 Migraine, unspecified, not intractable, without status migrainosus: Secondary | ICD-10-CM | POA: Diagnosis not present

## 2017-12-18 DIAGNOSIS — I252 Old myocardial infarction: Secondary | ICD-10-CM | POA: Insufficient documentation

## 2017-12-18 DIAGNOSIS — F329 Major depressive disorder, single episode, unspecified: Secondary | ICD-10-CM | POA: Diagnosis not present

## 2017-12-18 DIAGNOSIS — I493 Ventricular premature depolarization: Secondary | ICD-10-CM | POA: Insufficient documentation

## 2017-12-18 DIAGNOSIS — M810 Age-related osteoporosis without current pathological fracture: Secondary | ICD-10-CM | POA: Insufficient documentation

## 2017-12-18 DIAGNOSIS — I25110 Atherosclerotic heart disease of native coronary artery with unstable angina pectoris: Secondary | ICD-10-CM

## 2017-12-18 DIAGNOSIS — Z7982 Long term (current) use of aspirin: Secondary | ICD-10-CM | POA: Insufficient documentation

## 2017-12-18 DIAGNOSIS — K219 Gastro-esophageal reflux disease without esophagitis: Secondary | ICD-10-CM | POA: Diagnosis not present

## 2017-12-18 DIAGNOSIS — I471 Supraventricular tachycardia: Secondary | ICD-10-CM | POA: Diagnosis not present

## 2017-12-18 DIAGNOSIS — Z7951 Long term (current) use of inhaled steroids: Secondary | ICD-10-CM | POA: Insufficient documentation

## 2017-12-18 DIAGNOSIS — Z8673 Personal history of transient ischemic attack (TIA), and cerebral infarction without residual deficits: Secondary | ICD-10-CM | POA: Diagnosis not present

## 2017-12-18 DIAGNOSIS — Z87891 Personal history of nicotine dependence: Secondary | ICD-10-CM | POA: Insufficient documentation

## 2017-12-18 DIAGNOSIS — M1990 Unspecified osteoarthritis, unspecified site: Secondary | ICD-10-CM | POA: Insufficient documentation

## 2017-12-18 HISTORY — PX: LEFT HEART CATH AND CORONARY ANGIOGRAPHY: CATH118249

## 2017-12-18 SURGERY — LEFT HEART CATH AND CORONARY ANGIOGRAPHY
Anesthesia: LOCAL

## 2017-12-18 MED ORDER — SODIUM CHLORIDE 0.9 % IV SOLN: INTRAVENOUS | Status: AC

## 2017-12-18 MED ORDER — MIDAZOLAM HCL 2 MG/2ML IJ SOLN
INTRAMUSCULAR | Status: DC | PRN
  Administered 2017-12-18: 1 mg via INTRAVENOUS

## 2017-12-18 MED ORDER — ONDANSETRON HCL 4 MG/2ML IJ SOLN: 4.0000 mg | Freq: Four times a day (QID) | INTRAMUSCULAR | Status: DC | PRN

## 2017-12-18 MED ORDER — HEPARIN (PORCINE) IN NACL 1000-0.9 UT/500ML-% IV SOLN
INTRAVENOUS | Status: AC
  Filled 2017-12-18: qty 500

## 2017-12-18 MED ORDER — SODIUM CHLORIDE 0.9% FLUSH: 3.0000 mL | INTRAVENOUS | Status: DC | PRN

## 2017-12-18 MED ORDER — FENTANYL CITRATE (PF) 100 MCG/2ML IJ SOLN
INTRAMUSCULAR | Status: DC | PRN
  Administered 2017-12-18: 25 ug via INTRAVENOUS

## 2017-12-18 MED ORDER — SODIUM CHLORIDE 0.9 % IV SOLN: 250.0000 mL | INTRAVENOUS | Status: DC | PRN

## 2017-12-18 MED ORDER — ASPIRIN 81 MG PO CHEW: 81.0000 mg | CHEWABLE_TABLET | ORAL | Status: DC

## 2017-12-18 MED ORDER — HEPARIN SODIUM (PORCINE) 1000 UNIT/ML IJ SOLN
INTRAMUSCULAR | Status: AC
  Filled 2017-12-18: qty 1

## 2017-12-18 MED ORDER — HEPARIN SODIUM (PORCINE) 1000 UNIT/ML IJ SOLN
INTRAMUSCULAR | Status: DC | PRN
  Administered 2017-12-18: 3000 [IU] via INTRAVENOUS

## 2017-12-18 MED ORDER — VERAPAMIL HCL 2.5 MG/ML IV SOLN
INTRAVENOUS | Status: DC | PRN
  Administered 2017-12-18: 10 mL via INTRA_ARTERIAL

## 2017-12-18 MED ORDER — SODIUM CHLORIDE 0.9 % IV SOLN
INTRAVENOUS | Status: AC
  Administered 2017-12-18: 11:00:00 via INTRAVENOUS

## 2017-12-18 MED ORDER — VERAPAMIL HCL 2.5 MG/ML IV SOLN
INTRAVENOUS | Status: AC
  Filled 2017-12-18: qty 2

## 2017-12-18 MED ORDER — SODIUM CHLORIDE 0.9% FLUSH: 3.0000 mL | Freq: Two times a day (BID) | INTRAVENOUS | Status: DC

## 2017-12-18 MED ORDER — HEPARIN (PORCINE) IN NACL 1000-0.9 UT/500ML-% IV SOLN
INTRAVENOUS | Status: DC | PRN
  Administered 2017-12-18 (×2): 500 mL

## 2017-12-18 MED ORDER — LIDOCAINE HCL (PF) 1 % IJ SOLN
INTRAMUSCULAR | Status: DC | PRN
  Administered 2017-12-18: 2 mL

## 2017-12-18 MED ORDER — LIDOCAINE HCL (PF) 1 % IJ SOLN
INTRAMUSCULAR | Status: AC
  Filled 2017-12-18: qty 30

## 2017-12-18 MED ORDER — ACETAMINOPHEN 325 MG PO TABS: 650.0000 mg | ORAL_TABLET | ORAL | Status: DC | PRN

## 2017-12-18 MED ORDER — FENTANYL CITRATE (PF) 100 MCG/2ML IJ SOLN
INTRAMUSCULAR | Status: AC
  Filled 2017-12-18: qty 2

## 2017-12-18 MED ORDER — MIDAZOLAM HCL 2 MG/2ML IJ SOLN
INTRAMUSCULAR | Status: AC
  Filled 2017-12-18: qty 2

## 2017-12-18 SURGICAL SUPPLY — 10 items
CATH 5FR JL3.5 JR4 ANG PIG MP (CATHETERS) ×2 IMPLANT
DEVICE RAD TR BAND REGULAR (VASCULAR PRODUCTS) ×2 IMPLANT
GLIDESHEATH SLEND SS 6F .021 (SHEATH) ×2 IMPLANT
GUIDEWIRE INQWIRE 1.5J.035X260 (WIRE) ×1 IMPLANT
INQWIRE 1.5J .035X260CM (WIRE) ×2
KIT HEART LEFT (KITS) ×2 IMPLANT
PACK CARDIAC CATHETERIZATION (CUSTOM PROCEDURE TRAY) ×2 IMPLANT
SYR MEDRAD MARK V 150ML (SYRINGE) ×2 IMPLANT
TRANSDUCER W/STOPCOCK (MISCELLANEOUS) ×2 IMPLANT
TUBING CIL FLEX 10 FLL-RA (TUBING) ×2 IMPLANT

## 2017-12-18 NOTE — Discharge Instructions (Signed)

## 2017-12-18 NOTE — Interval H&P Note (Signed)
History and Physical Interval Note:  12/18/2017 12:42 PM  [name]  has presented today for cardiac cath with the diagnosis of angina, CAD  The various methods of treatment have been discussed with the patient and family. After consideration of risks, benefits and other options for treatment, the patient has consented to  Procedure(s): LEFT HEART CATH AND CORONARY ANGIOGRAPHY (N/A) as a surgical intervention .  The patient's history has been reviewed, patient examined, no change in status, stable for surgery.  I have reviewed the patient's chart and labs.  Questions were answered to the patient's satisfaction.    Cath Lab Visit (complete for each Cath Lab visit)  Clinical Evaluation Leading to the Procedure:   ACS: No.  Non-ACS:    Anginal Classification: CCS II  Anti-ischemic medical therapy: Minimal Therapy (1 class of medications)  Non-Invasive Test Results: No non-invasive testing performed  Prior CABG: No previous CABG         Lauree Chandler

## 2017-12-21 ENCOUNTER — Encounter (HOSPITAL_COMMUNITY): Payer: Self-pay | Admitting: Cardiovascular Disease

## 2018-01-12 NOTE — Progress Notes (Signed)
Cardiology Office Note    Date:  01/13/2018   ID:  Glenisha, Gundry 1945/07/24, MRN [id number]  PCP:  Glenford Bayley, DO  Cardiologist: Lauree Chandler, MD EPS: None  Chief Complaint  Patient presents with  . Follow-up    History of Present Illness:  GLADYSE CORVIN is a 72 y.o. female with history of CAD status post NSTEMI 2012 with severe disease in the mid LAD and mid RCA treated with stenting.  Echo 06/2016 normal LV size and function.  Complained of palpitations 10/2016 monitor showed normal sinus rhythm with rare PVCs and frequent PACs with several 6 beat runs of SVT.  Nuclear stress test January 2019 no ischemia.  Increased angina with dyspnea on office visit 12/11/2017.  Dr. Angelena Form recommended cardiac catheterization which showed the mid LAD stent to be patent with very mild restenosis.  Diagonal branch has a moderate ostial stenosis due to jailing of the branch by the LAD stent, OM 99% stenosis too small for PCI and unchanged from last cath, patent RCA stent normal LV function.  Medical therapy recommended and low dose troprol added.  Patient comes in today for f/u. Toprol has helped her angina but she complains of excessive fatigue and O2 sat was 88% when she came in today. Shortness of breath has worsened. She's tried breaking toprol in half but it didn't help.  Past Medical History:  Diagnosis Date  . Allergy   . Arthritis   . COPD (chronic obstructive pulmonary disease) ([place])   . Coronary artery disease August 2012   Drug eluting stent mid LAD, drug eluting stent mid RCA  . Depression   . Emphysema   . Heart attack [facility]) August 2012  . Migraine   . Osteoporosis   . TIA (transient ischemic attack) 1995    Past Surgical History:  Procedure Laterality Date  . APPENDECTOMY  01/1956  . CATARACT EXTRACTION    . CORONARY ANGIOPLASTY WITH STENT PLACEMENT  2012  . FOOT SURGERY Bilateral   . HAND SURGERY Left   . LEFT HEART CATH AND CORONARY ANGIOGRAPHY N/A  12/18/2017   Procedure: LEFT HEART CATH AND CORONARY ANGIOGRAPHY;  Surgeon: Burnell Blanks, MD;  Location: [place] [facility];  Service: Cardiovascular;  Laterality: N/A;  . NASAL SEPTUM SURGERY    . SHOULDER SURGERY Left    frozen shoulder  . THORACOTOMY    . TONSILLECTOMY      Current Medications: Current Meds  Medication Sig  . albuterol (PROAIR HFA) 108 (90 Base) MCG/ACT inhaler Inhale 2 puffs into the lungs every 4 (four) hours as needed for wheezing or shortness of breath.  Marland Kitchen aspirin EC 81 MG tablet Take 81 mg by mouth daily.  Marland Kitchen atorvastatin (LIPITOR) 40 MG tablet Take 1 tablet (40 mg total) by mouth daily.  . Cholecalciferol (VITAMIN D) 2000 UNITS CAPS Take 2,000 Units by mouth daily.   . clopidogrel (PLAVIX) 75 MG tablet TAKE 1 TABLET BY MOUTH EVERY DAY (Patient taking differently: Take 75 mg by mouth daily. )  . denosumab (PROLIA) 60 MG/ML SOLN injection Inject 60 mg into the skin every 6 (six) months. Administer in upper arm, thigh, or abdomen  . Estradiol 4 MCG INST Place 4 mcg vaginally 2 (two) times a week.   . fluticasone furoate-vilanterol (BREO ELLIPTA) 100-25 MCG/INH AEPB Inhale 1 puff into the lungs daily.  . nitroGLYCERIN (NITROSTAT) 0.4 MG SL tablet PLACE 1 TABLET (0.4 MG TOTAL) UNDER THE TONGUE EVERY 5 (FIVE)  MINUTES AS NEEDED FOR CHEST PAIN. (Patient taking differently: Place 0.4 mg under the tongue every 5 (five) minutes as needed for chest pain. )  . sertraline (ZOLOFT) 100 MG tablet Take 100 mg by mouth daily.  Marland Kitchen topiramate (TOPAMAX) 100 MG tablet Take 100 mg by mouth daily.   Marland Kitchen zolpidem (AMBIEN) 10 MG tablet Take 10 mg by mouth at bedtime as needed for sleep.   . [DISCONTINUED] metoprolol succinate (TOPROL XL) 25 MG 24 hr tablet Take 1 tablet (25 mg total) by mouth daily.   Current Facility-Administered Medications for the 01/13/18 encounter (Office Visit) with Imogene Burn, PA-C  Medication  . 0.9 %  sodium chloride infusion     Allergies:    Naproxen; Parabens; Estradiol; and Tramadol hcl   Social History   Socioeconomic History  . Marital status: Married    Spouse name: Not on file  . Number of children: 2  . Years of education: Not on file  . Highest education level: Not on file  Occupational History  . Occupation: retired  Scientific laboratory technician  . Financial resource strain: Not on file  . Food insecurity:    Worry: Not on file    Inability: Not on file  . Transportation needs:    Medical: Not on file    Non-medical: Not on file  Tobacco Use  . Smoking status: Former Smoker    Packs/day: 1.00    Years: 10.00    Pack years: 10.00    Types: Cigarettes    Last attempt to quit: 05/06/1971    Years since quitting: 46.7  . Smokeless tobacco: Never Used  Substance and Sexual Activity  . Alcohol use: No  . Drug use: No  . Sexual activity: Not on file  Lifestyle  . Physical activity:    Days per week: Not on file    Minutes per session: Not on file  . Stress: Not on file  Relationships  . Social connections:    Talks on phone: Not on file    Gets together: Not on file    Attends religious service: Not on file    Active member of club or organization: Not on file    Attends meetings of clubs or organizations: Not on file    Relationship status: Not on file  Other Topics Concern  . Not on file  Social History Narrative  . Not on file     Family History:  The patient's family history includes Diabetes in her maternal aunt, maternal grandmother, and mother; Heart disease in her paternal grandfather and paternal grandmother; Lung cancer in her maternal uncle; Rheum arthritis in her maternal grandfather and maternal grandmother.   ROS:   Please see the history of present illness.    Review of Systems  Constitution: Positive for malaise/fatigue.  HENT: Negative.   Eyes: Negative.   Cardiovascular: Positive for dyspnea on exertion.  Respiratory: Positive for cough.   Hematologic/Lymphatic: Negative.     Musculoskeletal: Negative.  Negative for joint pain.  Gastrointestinal: Negative.   Genitourinary: Negative.   Neurological: Negative.    All other systems reviewed and are negative.   PHYSICAL EXAM:   VS:  BP 118/66 (BP Location: Left Arm, Patient Position: Sitting, Cuff Size: Normal)   Pulse 65   Ht 5' 2.5" (1.588 m)   Wt 117 lb 12.8 oz (53.4 kg)   SpO2 (!) 88% Comment: at rest  BMI 21.20 kg/m   Physical Exam  GEN: Well nourished, well developed,  in no acute distress  Neck: no JVD, carotid bruits, or masses Cardiac:RRR; no murmurs, rubs, or gallops  Respiratory:  clear to auscultation bilaterally, normal work of breathing GI: soft, nontender, nondistended, + BS Ext: Right arm at cath site without hematoma or hemorrhage, lower extremities without cyanosis, clubbing, or edema, Good distal pulses bilaterally Neuro:  Alert and Oriented x 3 Psych: euthymic mood, full affect  Wt Readings from Last 3 Encounters:  01/13/18 117 lb 12.8 oz (53.4 kg)  12/18/17 117 lb (53.1 kg)  12/11/17 117 lb 6.4 oz (53.3 kg)      Studies/Labs Reviewed:   EKG:  EKG is not ordered today.   Recent Labs: 07/29/2017: ALT 23 12/11/2017: BUN 13; Creatinine, Ser 0.86; Hemoglobin 14.0; Platelets 300; Potassium 4.0; Sodium 140   Lipid Panel    Component Value Date/Time   CHOL 135 07/29/2017 0942   TRIG 90 07/29/2017 0942   HDL 67 07/29/2017 0942   CHOLHDL 2.0 07/29/2017 0942   CHOLHDL 2 01/23/2011 1042   VLDL 10.6 01/23/2011 1042   LDLCALC 50 07/29/2017 0942    Additional studies/ records that were reviewed today include:   Cardiac catheterization 12/18/2017  Previously placed Prox RCA to Mid RCA stent (unknown type) is widely patent.  Mid RCA lesion is 20% stenosed.  Ost 2nd Mrg lesion is 99% stenosed.  Prox LAD lesion is 10% stenosed.  Ost 1st Diag lesion is 60% stenosed.  The left ventricular systolic function is normal.  LV end diastolic pressure is normal.  The left ventricular  ejection fraction is 55-65% by visual estimate.  There is no mitral valve regurgitation.   1. The LAD is a large vessel that courses to the apex. The mid LAD stent is patent with very mild restenosis. The moderate caliber diagonal branch arises from the stented segment of the LAD and has a moderate ostial stenosis due to jailing of the branch by the LAD stent.  2. The Circumflex is a small caliber vessel with a small distal obtuse marginal branch. The obtuse marginal branch has a 99% stenosis. This is too small for PCI and unchanged from her cath in 2012.  3. The RCA is a large dominant vessel with a patent mid vessel stent. The stent has no restenosis. The distal RCA has mild plaque.  4. Normal LV systolic function   Recommendations: Continue medical therapy.     Nuclear stress test 1/2019Nuclear stress EF: 78%. No wall motion abnormalities.  There was no ST segment deviation noted during stress.  Defect 1: There is a small defect of mild severity present in the basal anterolateral location. There is an apical anterior defect seen at rest, not stress.  Overall low risk nuclear stress test with no areas of significant ischemia identified. It is possible that the basal anterolateral defect is artifactual.   Candee Furbish, MD    ASSESSMENT:    1. Coronary artery disease involving native coronary artery of native heart without angina pectoris   2. HYPERCHOLESTEROLEMIA      PLAN:  In order of problems listed above:  CAD with recent increase in angina leading to cardiac catheterization 12/18/2017 which recommended medical therapy- not tolerating toprol.  Which is causing excessive fatigue and increased shortness of breath even on 1/2 tablet daily.  Will stop Toprol and try Imdur 30 mg once daily.  She can break it in half if she feels like it is too much for her.  Follow-up with Dr. Angelena Form in 6 months or  with myself sooner if needed.  Hypercholesterolemia LDL 50-07/2017  Lipitor.   Medication Adjustments/Labs and Tests Ordered: Current medicines are reviewed at length with the patient today.  Concerns regarding medicines are outlined above.  Medication changes, Labs and Tests ordered today are listed in the Patient Instructions below. Patient Instructions  Medication Instructions:  Your physician has recommended you make the following change in your medication:   1. STOP: metoprolol succinate (Toprol)  2. START: isosorbide mononitrate (imdur 30 mg tablet: Take 1 tablet by mouth once a day  Labwork: None ordered  Testing/Procedures: None ordered  Follow-Up: Your physician wants you to follow-up in: 6 months with Dr. Angelena Form. You will receive a reminder letter in the mail two months in advance. If you don't receive a letter, please call our office to schedule the follow-up appointment.   Any Other Special Instructions Will Be Listed Below (If Applicable).     If you need a refill on your cardiac medications before your next appointment, please call your pharmacy.      Sumner Boast, PA-C  01/13/2018 9:56 AM    [facility] [address], [place], [place]  [address] Phone: [phone]; Fax: [phone]

## 2018-01-13 ENCOUNTER — Ambulatory Visit (INDEPENDENT_AMBULATORY_CARE_PROVIDER_SITE_OTHER): Payer: Medicare Other | Admitting: Physician Assistant

## 2018-01-13 ENCOUNTER — Encounter: Payer: Self-pay | Admitting: Physician Assistant

## 2018-01-13 VITALS — BP 118/66 | HR 65 | Ht 62.5 in | Wt 117.8 lb

## 2018-01-13 DIAGNOSIS — I2510 Atherosclerotic heart disease of native coronary artery without angina pectoris: Secondary | ICD-10-CM | POA: Diagnosis not present

## 2018-01-13 DIAGNOSIS — I25110 Atherosclerotic heart disease of native coronary artery with unstable angina pectoris: Secondary | ICD-10-CM

## 2018-01-13 DIAGNOSIS — E7800 Pure hypercholesterolemia, unspecified: Secondary | ICD-10-CM | POA: Diagnosis not present

## 2018-01-13 MED ORDER — ISOSORBIDE MONONITRATE ER 30 MG PO TB24: 30.0000 mg | ORAL_TABLET | Freq: Every day | ORAL | 3 refills | Status: DC

## 2018-01-13 NOTE — Patient Instructions (Signed)
Medication Instructions:  Your physician has recommended you make the following change in your medication:   1. STOP: metoprolol succinate (Toprol)  2. START: isosorbide mononitrate (imdur 30 mg tablet: Take 1 tablet by mouth once a day  Labwork: None ordered  Testing/Procedures: None ordered  Follow-Up: Your physician wants you to follow-up in: 6 months with Dr. Angelena Form. You will receive a reminder letter in the mail two months in advance. If you don't receive a letter, please call our office to schedule the follow-up appointment.   Any Other Special Instructions Will Be Listed Below (If Applicable).     If you need a refill on your cardiac medications before your next appointment, please call your pharmacy.

## 2018-01-14 DIAGNOSIS — I2510 Atherosclerotic heart disease of native coronary artery without angina pectoris: Secondary | ICD-10-CM | POA: Diagnosis not present

## 2018-01-14 DIAGNOSIS — R0602 Shortness of breath: Secondary | ICD-10-CM | POA: Diagnosis not present

## 2018-01-14 DIAGNOSIS — J449 Chronic obstructive pulmonary disease, unspecified: Secondary | ICD-10-CM | POA: Diagnosis not present

## 2018-01-18 DIAGNOSIS — Z0000 Encounter for general adult medical examination without abnormal findings: Secondary | ICD-10-CM | POA: Diagnosis not present

## 2018-01-18 DIAGNOSIS — G4700 Insomnia, unspecified: Secondary | ICD-10-CM | POA: Diagnosis not present

## 2018-01-18 DIAGNOSIS — M810 Age-related osteoporosis without current pathological fracture: Secondary | ICD-10-CM | POA: Diagnosis not present

## 2018-01-18 DIAGNOSIS — I2510 Atherosclerotic heart disease of native coronary artery without angina pectoris: Secondary | ICD-10-CM | POA: Diagnosis not present

## 2018-01-18 DIAGNOSIS — R0602 Shortness of breath: Secondary | ICD-10-CM | POA: Diagnosis not present

## 2018-01-18 DIAGNOSIS — J449 Chronic obstructive pulmonary disease, unspecified: Secondary | ICD-10-CM | POA: Diagnosis not present

## 2018-02-02 DIAGNOSIS — Z23 Encounter for immunization: Secondary | ICD-10-CM | POA: Diagnosis not present

## 2018-02-11 DIAGNOSIS — M810 Age-related osteoporosis without current pathological fracture: Secondary | ICD-10-CM | POA: Diagnosis not present

## 2018-04-23 ENCOUNTER — Other Ambulatory Visit: Payer: Self-pay | Admitting: Cardiovascular Disease

## 2018-04-23 DIAGNOSIS — I2510 Atherosclerotic heart disease of native coronary artery without angina pectoris: Secondary | ICD-10-CM

## 2018-06-04 DIAGNOSIS — M810 Age-related osteoporosis without current pathological fracture: Secondary | ICD-10-CM | POA: Diagnosis not present

## 2018-08-05 ENCOUNTER — Telehealth: Payer: Self-pay

## 2018-08-05 NOTE — Telephone Encounter (Signed)
   Primary Cardiologist:  Lauree Chandler, MD   Patient contacted.  History reviewed.  No symptoms to suggest any unstable cardiac conditions.  Based on discussion, with current pandemic situation, we will be postponing this appointment for [name] with a plan for f/u in 12 wks or sooner if feasible/necessary.  If symptoms change, she has been instructed to contact our office.   Routing to C19 CANCEL pool for tracking (P CV DIV CV19 CANCEL - reason for visit "other.") and assigning priority (1 = 4-6 wks, 2 = 6-12 wks, 3 = >12 wks).   Wilma Flavin, RN  08/05/2018 1:44 PM         .

## 2018-08-12 ENCOUNTER — Telehealth: Payer: Self-pay | Admitting: Cardiology

## 2018-08-12 NOTE — Telephone Encounter (Signed)
YOUR CARDIOLOGY TEAM HAS ARRANGED FOR AN E-VISIT FOR YOUR APPOINTMENT - PLEASE REVIEW IMPORTANT INFORMATION BELOW SEVERAL DAYS PRIOR TO YOUR APPOINTMENT  Due to the recent COVID-19 pandemic, we are transitioning in-person office visits to tele-medicine visits in an effort to decrease unnecessary exposure to our patients and staff. Medicare and most insurances are covering these visits without a copay needed. We also encourage you to sign up for MyChart if you have not already done so. You will need a smartphone if possible. For patients that do not have this, we can still complete the visit using a regular telephone but do prefer a smartphone to enable video when possible. You may have a close family member that lives with you that can help. If possible, we also ask that you have a blood pressure cuff and scale at home to measure your blood pressure, heart rate and weight prior to your scheduled appointment. Patients with clinical needs that need an in-person evaluation and testing will still be able to come to the office if absolutely necessary. If you have any questions, feel free to call our office.   THE DAY OF YOUR APPOINTMENT  Approximately 15 minutes prior to your scheduled appointment, you will receive a telephone call from one of [facility] team - your caller ID may say "Unknown caller."  Our staff will confirm medications, vital signs for the day and any symptoms you may be experiencing. Please have this information available prior to the time of visit start. It may also be helpful for you to have a pad of paper and pen handy for any instructions given during your visit. They will also walk you through joining the WebEx smartphone meeting if this is a video visit.    CONSENT FOR TELE-HEALTH VISIT - PLEASE REVIEW  I hereby voluntarily request, consent and authorize [facility] and its employed or contracted physicians, physician assistants, nurse practitioners or other licensed health care  professionals (the Practitioner), to provide me with telemedicine health care services (the "Services") as deemed necessary by the treating Practitioner. I acknowledge and consent to receive the Services by the Practitioner via telemedicine. I understand that the telemedicine visit will involve communicating with the Practitioner through live audiovisual communication technology and the disclosure of certain medical information by electronic transmission. I acknowledge that I have been given the opportunity to request an in-person assessment or other available alternative prior to the telemedicine visit and am voluntarily participating in the telemedicine visit.  I understand that I have the right to withhold or withdraw my consent to the use of telemedicine in the course of my care at any time, without affecting my right to future care or treatment, and that the Practitioner or I may terminate the telemedicine visit at any time. I understand that I have the right to inspect all information obtained and/or recorded in the course of the telemedicine visit and may receive copies of available information for a reasonable fee.  I understand that some of the potential risks of receiving the Services via telemedicine include:  . Delay or interruption in medical evaluation due to technological equipment failure or disruption; . Information transmitted may not be sufficient (e.g. poor resolution of images) to allow for appropriate medical decision making by the Practitioner; and/or  . In rare instances, security protocols could fail, causing a breach of personal health information.  Furthermore, I acknowledge that it is my responsibility to provide information about my medical history, conditions and care that is complete and accurate   to the best of my ability. I acknowledge that Practitioner's advice, recommendations, and/or decision may be based on factors not within their control, such as incomplete or inaccurate  data provided by me or distortions of diagnostic images or specimens that may result from electronic transmissions. I understand that the practice of medicine is not an exact science and that Practitioner makes no warranties or guarantees regarding treatment outcomes. I acknowledge that I will receive a copy of this consent concurrently upon execution via email to the email address I last provided but may also request a printed copy by calling the office of [facility].    I understand that my insurance will be billed for this visit.   I have read or had this consent read to me. . I understand the contents of this consent, which adequately explains the benefits and risks of the Services being provided via telemedicine.  . I have been provided ample opportunity to ask questions regarding this consent and the Services and have had my questions answered to my satisfaction. . I give my informed consent for the services to be provided through the use of telemedicine in my medical care  By participating in this telemedicine visit I agree to the above.   

## 2018-08-16 DIAGNOSIS — D1801 Hemangioma of skin and subcutaneous tissue: Secondary | ICD-10-CM | POA: Diagnosis not present

## 2018-08-16 DIAGNOSIS — L812 Freckles: Secondary | ICD-10-CM | POA: Diagnosis not present

## 2018-08-19 ENCOUNTER — Ambulatory Visit: Payer: Medicare Other | Admitting: Cardiovascular Disease

## 2018-08-19 ENCOUNTER — Telehealth: Payer: Self-pay | Admitting: Cardiology

## 2018-08-19 NOTE — Telephone Encounter (Signed)
Spoke with patient who confirmed all demographics.Will have vitals ready for visit.

## 2018-08-20 NOTE — Progress Notes (Signed)
Virtual Visit via Video Note   This visit type was conducted due to national recommendations for restrictions regarding the COVID-19 Pandemic (e.g. social distancing) in an effort to limit this patient's exposure and mitigate transmission in our community.  Due to her co-morbid illnesses, this patient is at least at moderate risk for complications without adequate follow up.  This format is felt to be most appropriate for this patient at this time.  All issues noted in this document were discussed and addressed.  A limited physical exam was performed with this format.  Please refer to the patient's chart for her consent to telehealth for [facility].   Evaluation Performed:  Follow-up visit  Date:  08/24/2018   ID:  [name] 1945-06-13, MRN [id number]  Patient Location: Home Provider Location: Home  PCP:  Patient, No Pcp Per  Cardiologist:  Lauree Chandler, MD  Electrophysiologist:  None   Chief Complaint: Follow-up for CAD, seen for Dr. Angelena Form  History of Present Illness:    [name] is a 73 y.o. female with CAD status post NSTEMI 2012 with severe disease in the mid LAD and mid RCA treated with stenting, COPD, and TIA.   [name] has a prior history of CAD that includes a NSTEMI in 2012 with severe disease in the mLAD and mRCA that was treating with stenting. Echocardiogram 06/2016 showed a normal LV function. She had complaints of palpations in June 2018 in which she wore a monitor that showed NSR with rare PVCs and frequent PACs and several 6 beat runs of SVT. Given this, a nuclear stress test was performed 05/2017 that showed no ischemia. She was then seen in the office with c/o increased anginal symptoms and SOB. It was recommended that she undergo a cardiac cath which was performed 12/18/2017 that showed a patent mLAD stent with very mild stenosis, moderate stenosis in the diagonal branch due to jailing of the branch by the LAD stent, an OM that was 99%  stenosed found to be too small for PCI and unchanged from last cath and patent RCA stent. Medical therapy recommended and low dose troprol added.  In post procedure follow up, she reported that the Toprol had helped her angina. She was describing excessive fatigue and her O2 saturations were found to be 88%. She stated that she had tried breaking the Toprol in half but that didn't help her fatigue. Toprol was therefore stopped and Imdur was started at 30mg  daily with directions that she may break in half if that works better for her. Her LDL was noted to be controlled at 50 on 07/2017 and she was continued on Lipitor    Today, she is seen for virtual visit and is doing very well.  She denies any recurrent anginal symptoms.  Given the above, she was taken off Toprol and placed on Imdur however, patient states she was unable to tolerate secondary to significant headaches.  She tried for 1 month duration and just was unable to stay on the medicine at this time.  She states she has not had any recurrent anginal symptoms including chest pain, shortness of breath, palpitations, LE swelling, PND, orthopnea symptoms, presyncopal or syncopal episodes.  We discussed further options with amlodipine or Ranexa if she does have recurrent symptoms in the future.  She is agreeable to this plan.  She follows with her PCP in 12/2018 for complete physical as well as labs.  Plan is to have those labs sent to Dr.  McAlhany's office for review.  She was unable to take her blood pressure today given that her blood pressure cuff stopped working this morning.  She is not concerned with elevated BP.  Otherwise no specific complaints at this time.   The patient does not have symptoms concerning for COVID-19 infection (fever, chills, cough, or new shortness of breath).   Past Medical History:  Diagnosis Date   Allergy    Arthritis    COPD (chronic obstructive pulmonary disease) ([place])    Coronary artery disease August 2012    Drug eluting stent mid LAD, drug eluting stent mid RCA   Depression    Emphysema    Heart attack [facility]) August 2012   Migraine    Osteoporosis    TIA (transient ischemic attack) 1995   Past Surgical History:  Procedure Laterality Date   APPENDECTOMY  01/1956   CATARACT EXTRACTION     CORONARY ANGIOPLASTY WITH STENT PLACEMENT  2012   FOOT SURGERY Bilateral    HAND SURGERY Left    LEFT HEART CATH AND CORONARY ANGIOGRAPHY N/A 12/18/2017   Procedure: LEFT HEART CATH AND CORONARY ANGIOGRAPHY;  Surgeon: Burnell Blanks, MD;  Location: [facility];  Service: Cardiovascular;  Laterality: N/A;   NASAL SEPTUM SURGERY     SHOULDER SURGERY Left    frozen shoulder   THORACOTOMY     TONSILLECTOMY       Current Meds  Medication Sig   aspirin EC 81 MG tablet Take 81 mg by mouth daily.   atorvastatin (LIPITOR) 40 MG tablet Take 1 tablet (40 mg total) by mouth daily.   Cholecalciferol (VITAMIN D) 2000 UNITS CAPS Take 2,000 Units by mouth daily.    clopidogrel (PLAVIX) 75 MG tablet TAKE 1 TABLET BY MOUTH EVERY DAY   denosumab (PROLIA) 60 MG/ML SOLN injection Inject 60 mg into the skin every 6 (six) months. Administer in upper arm, thigh, or abdomen   nitroGLYCERIN (NITROSTAT) 0.4 MG SL tablet PLACE 1 TABLET (0.4 MG TOTAL) UNDER THE TONGUE EVERY 5 (FIVE) MINUTES AS NEEDED FOR CHEST PAIN.   sertraline (ZOLOFT) 100 MG tablet Take 50 mg by mouth daily.    topiramate (TOPAMAX) 100 MG tablet Take 100 mg by mouth daily.    zolpidem (AMBIEN) 10 MG tablet Take 10 mg by mouth at bedtime as needed for sleep.    [DISCONTINUED] albuterol (PROAIR HFA) 108 (90 Base) MCG/ACT inhaler Inhale 2 puffs into the lungs every 4 (four) hours as needed for wheezing or shortness of breath.   [DISCONTINUED] fluticasone furoate-vilanterol (BREO ELLIPTA) 100-25 MCG/INH AEPB Inhale 1 puff into the lungs daily.   [DISCONTINUED] nitroGLYCERIN (NITROSTAT) 0.4 MG SL tablet PLACE 1 TABLET (0.4  MG TOTAL) UNDER THE TONGUE EVERY 5 (FIVE) MINUTES AS NEEDED FOR CHEST PAIN. (Patient taking differently: Place 0.4 mg under the tongue every 5 (five) minutes as needed for chest pain. )   Current Facility-Administered Medications for the 08/24/18 encounter (Telemedicine) with Tommie Raymond, NP  Medication   0.9 %  sodium chloride infusion     Allergies:   Naproxen; Parabens; Estradiol; and Tramadol hcl   Social History   Tobacco Use   Smoking status: Former Smoker    Packs/day: 1.00    Years: 10.00    Pack years: 10.00    Types: Cigarettes    Last attempt to quit: 05/06/1971    Years since quitting: 47.3   Smokeless tobacco: Never Used  Substance Use Topics   Alcohol use:  No   Drug use: No     Family Hx: The patient's family history includes Diabetes in her maternal aunt, maternal grandmother, and mother; Heart disease in her paternal grandfather and paternal grandmother; Lung cancer in her maternal uncle; Rheum arthritis in her maternal grandfather and maternal grandmother.  ROS:   Please see the history of present illness.     All other systems reviewed and are negative.   Prior CV studies:   The following studies were reviewed today:  Cardiac catheterization 12/18/2017  Previously placed Prox RCA to Mid RCA stent (unknown type) is widely patent.  Mid RCA lesion is 20% stenosed.  Ost 2nd Mrg lesion is 99% stenosed.  Prox LAD lesion is 10% stenosed.  Ost 1st Diag lesion is 60% stenosed.  The left ventricular systolic function is normal.  LV end diastolic pressure is normal.  The left ventricular ejection fraction is 55-65% by visual estimate.  There is no mitral valve regurgitation.  1. The LAD is a large vessel that courses to the apex. The mid LAD stent is patent with very mild restenosis. The moderate caliber diagonal branch arises from the stented segment of the LAD and has a moderate ostial stenosis due to jailing of the branch by the LAD stent.    2. The Circumflex is a small caliber vessel with a small distal obtuse marginal branch. The obtuse marginal branch has a 99% stenosis. This is too small for PCI and unchanged from her cath in 2012.  3. The RCA is a large dominant vessel with a patent mid vessel stent. The stent has no restenosis. The distal RCA has mild plaque.  4. Normal LV systolic function  Recommendations: Continue medical therapy.    Nuclear stress test 05/2017: 78%. No wall motion abnormalities.  There was no ST segment deviation noted during stress.  Defect 1: There is a small defect of mild severity present in the basal anterolateral location. There is an apical anterior defect seen at rest, not stress.  Overall low risk nuclear stress test with no areas of significant ischemia identified. It is possible that the basal anterolateral defect is artifactual.  Labs/Other Tests and Data Reviewed:    EKG:  An ECG dated 12/18/2017 was personally reviewed today and demonstrated:  Sinus bradycardia  Recent Labs: 12/11/2017: BUN 13; Creatinine, Ser 0.86; Hemoglobin 14.0; Platelets 300; Potassium 4.0; Sodium 140   Recent Lipid Panel Lab Results  Component Value Date/Time   CHOL 135 07/29/2017 09:42 AM   TRIG 90 07/29/2017 09:42 AM   HDL 67 07/29/2017 09:42 AM   CHOLHDL 2.0 07/29/2017 09:42 AM   CHOLHDL 2 01/23/2011 10:42 AM   LDLCALC 50 07/29/2017 09:42 AM    Wt Readings from Last 3 Encounters:  08/24/18 115 lb (52.2 kg)  01/13/18 117 lb 12.8 oz (53.4 kg)  12/18/17 117 lb (53.1 kg)     Objective:    Vital Signs:  Pulse 73    Ht 5' 2.5" (1.588 m)    Wt 115 lb (52.2 kg)    SpO2 90%    BMI 20.70 kg/m    VITAL SIGNS:  reviewed GEN:  no acute distress EYES:  sclerae anicteric, EOMI - Extraocular Movements Intact RESPIRATORY:  normal respiratory effort, symmetric expansion CARDIOVASCULAR:  no peripheral edema SKIN:  no rash, lesions or ulcers. MUSCULOSKELETAL:  no obvious deformities. NEURO:  alert and  oriented x 3, no obvious focal deficit PSYCH:  normal affect  ASSESSMENT & PLAN:    1.  CAD without angina: -Denies recurrent anginal symptoms -Has known progressive CAD (NSTEMI 2012 with stenting), more recently with a 99% OM lesion found to small for PCI per cardiac cath 12/18/2017 -Was previously intolerant to Toprol secondary to fatigue>> placed on Imdur and again was unable to tolerate secondary to significant headaches -If recurrent symptoms, discussed options such as amlodipine or Ranexa however given that she is currently asymptomatic will defer at this time -Continue ASA 81, atorvastatin 40, Plavix 75  2. HLD: -Last LDL 50 on 07/29/2017 -Continue atorvastatin 40 mg daily -Will need repeat lipid panel with LFTs at next office visit or at PCP.  Not performed at this time secondary to COVID-19 precautions  3.  History of palpitations: -Denies recurrent palpitations   COVID-19 Education: The signs and symptoms of COVID-19 were discussed with the patient and how to seek care for testing (follow up with PCP or arrange E-visit). The importance of social distancing was discussed today.  Time:   Today, I have spent 20 minutes with the patient with telehealth technology discussing the above problems.     Medication Adjustments/Labs and Tests Ordered: Current medicines are reviewed at length with the patient today.  Concerns regarding medicines are outlined above.   Tests Ordered: No orders of the defined types were placed in this encounter.   Medication Changes: Meds ordered this encounter  Medications   nitroGLYCERIN (NITROSTAT) 0.4 MG SL tablet    Sig: PLACE 1 TABLET (0.4 MG TOTAL) UNDER THE TONGUE EVERY 5 (FIVE) MINUTES AS NEEDED FOR CHEST PAIN.    Dispense:  25 tablet    Refill:  5    Disposition:  Follow up Dr. Angelena Form in 6 months  Signed, Kathyrn Drown, NP  08/24/2018 10:45 AM    [place]

## 2018-08-20 NOTE — Telephone Encounter (Signed)
Virtual Visit Pre-Appointment Phone Call  Steps For Call:  1. Confirm consent - "In the setting of the current Covid19 crisis, you are scheduled for a (phone or video) visit with your provider on (date) at (time).  Just as we do with many in-office visits, in order for you to participate in this visit, we must obtain consent.  If you'd like, I can send this to your mychart (if signed up) or email for you to review.  Otherwise, I can obtain your verbal consent now.  All virtual visits are billed to your insurance company just like a normal visit would be.  By agreeing to a virtual visit, we'd like you to understand that the technology does not allow for your provider to perform an examination, and thus may limit your provider's ability to fully assess your condition.  Finally, though the technology is pretty good, we cannot assure that it will always work on either your or our end, and in the setting of a video visit, we may have to convert it to a phone-only visit.  In either situation, we cannot ensure that we have a secure connection.  Are you willing to proceed?" STAFF: Did the patient verbally acknowledge consent to telehealth visit? Document YES/NO here: YES  2. Confirm the BEST phone number to call the day of the visit by including in appointment notes  3. Give patient instructions for WebEx/MyChart download to smartphone as below or Doximity/Doxy.me if video visit (depending on what platform provider is using)  4. Advise patient to be prepared with their blood pressure, heart rate, weight, any heart rhythm information, their current medicines, and a piece of paper and pen handy for any instructions they may receive the day of their visit  5. Inform patient they will receive a phone call 15 minutes prior to their appointment time (may be from unknown caller ID) so they should be prepared to answer  6. Confirm that appointment type is correct in Epic appointment notes (VIDEO vs PHONE)      TELEPHONE CALL NOTE  [name] has been deemed a candidate for a follow-up tele-health visit to limit community exposure during the Covid-19 pandemic. I spoke with the patient via phone to ensure availability of phone/video source, confirm preferred email & phone number, and discuss instructions and expectations.  I reminded [name] to be prepared with any vital sign and/or heart rhythm information that could potentially be obtained via home monitoring, at the time of her visit. I reminded [name] to expect a phone call at the time of her visit if her visit.  [name], [place] 08/20/2018 7:05 AM   INSTRUCTIONS FOR DOWNLOADING THE WEBEX APP TO SMARTPHONE  - If Apple, ask patient to go to App Store and type in WebEx in the search bar. [employer], the blue/green circle. If Android, go to [employer] and type in [employer] in the search bar. The app is free but as with any other app downloads, their phone may require them to verify saved payment information or Apple/Android password.  - The patient does NOT have to create an account. - On the day of the visit, the assist will walk the patient through joining the meeting with the meeting number/password.  INSTRUCTIONS FOR DOWNLOADING THE MYCHART APP TO SMARTPHONE  - The patient must first make sure to have activated MyChart and know their login information - If Apple, go to [employer] and type in  MyChart in the search bar and download the app. If Android, ask patient to go to [employer] and type in [place] in the search bar and download the app. The app is free but as with any other app downloads, their phone may require them to verify saved payment information or Apple/Android password.  - The patient will need to then log into the app with their MyChart username and password, and select [facility] as their healthcare provider to link the account. When it is time for your visit, go to the MyChart  app, find appointments, and click Begin Video Visit. Be sure to Select Allow for your device to access the Microphone and Camera for your visit. You will then be connected, and your provider will be with you shortly.  **If they have any issues connecting, or need assistance please contact MyChart service desk [phone])**  **If using a computer, in order to ensure the best quality for their visit they will need to use either of the following Internet Browsers: [employer], or Google Chrome**  IF USING DOXIMITY or DOXY.ME - The patient will receive a link just prior to their visit, either by text or email (to be determined day of appointment depending on if it's doxy.me or Doximity).     FULL LENGTH CONSENT FOR TELE-HEALTH VISIT   I hereby voluntarily request, consent and authorize [facility] and its employed or contracted physicians, physician assistants, nurse practitioners or other licensed health care professionals (the Practitioner), to provide me with telemedicine health care services (the "Services") as deemed necessary by the treating Practitioner. I acknowledge and consent to receive the Services by the Practitioner via telemedicine. I understand that the telemedicine visit will involve communicating with the Practitioner through live audiovisual communication technology and the disclosure of certain medical information by electronic transmission. I acknowledge that I have been given the opportunity to request an in-person assessment or other available alternative prior to the telemedicine visit and am voluntarily participating in the telemedicine visit.  I understand that I have the right to withhold or withdraw my consent to the use of telemedicine in the course of my care at any time, without affecting my right to future care or treatment, and that the Practitioner or I may terminate the telemedicine visit at any time. I understand that I have the right to inspect all  information obtained and/or recorded in the course of the telemedicine visit and may receive copies of available information for a reasonable fee.  I understand that some of the potential risks of receiving the Services via telemedicine include:  Marland Kitchen Delay or interruption in medical evaluation due to technological equipment failure or disruption; . Information transmitted may not be sufficient (e.g. poor resolution of images) to allow for appropriate medical decision making by the Practitioner; and/or  . In rare instances, security protocols could fail, causing a breach of personal health information.  Furthermore, I acknowledge that it is my responsibility to provide information about my medical history, conditions and care that is complete and accurate to the best of my ability. I acknowledge that Practitioner's advice, recommendations, and/or decision may be based on factors not within their control, such as incomplete or inaccurate data provided by me or distortions of diagnostic images or specimens that may result from electronic transmissions. I understand that the practice of medicine is not an exact science and that Practitioner makes no warranties or guarantees regarding treatment outcomes. I acknowledge that I will receive a copy  of this consent concurrently upon execution via email to the email address I last provided but may also request a printed copy by calling the office of [place].    I understand that my insurance will be billed for this visit.   I have read or had this consent read to me. . I understand the contents of this consent, which adequately explains the benefits and risks of the Services being provided via telemedicine.  . I have been provided ample opportunity to ask questions regarding this consent and the Services and have had my questions answered to my satisfaction. . I give my informed consent for the services to be provided through the use of telemedicine in my  medical care  By participating in this telemedicine visit I agree to the above.

## 2018-08-24 ENCOUNTER — Other Ambulatory Visit: Payer: Self-pay

## 2018-08-24 ENCOUNTER — Encounter: Payer: Self-pay | Admitting: Cardiology

## 2018-08-24 ENCOUNTER — Telehealth (INDEPENDENT_AMBULATORY_CARE_PROVIDER_SITE_OTHER): Payer: Medicare Other | Admitting: Cardiology

## 2018-08-24 VITALS — HR 73 | Ht 62.5 in | Wt 115.0 lb

## 2018-08-24 DIAGNOSIS — Z79899 Other long term (current) drug therapy: Secondary | ICD-10-CM

## 2018-08-24 DIAGNOSIS — E7800 Pure hypercholesterolemia, unspecified: Secondary | ICD-10-CM | POA: Diagnosis not present

## 2018-08-24 DIAGNOSIS — I2510 Atherosclerotic heart disease of native coronary artery without angina pectoris: Secondary | ICD-10-CM

## 2018-08-24 MED ORDER — NITROGLYCERIN 0.4 MG SL SUBL: SUBLINGUAL_TABLET | SUBLINGUAL | 5 refills | Status: DC

## 2018-08-24 NOTE — Patient Instructions (Addendum)
Medication Instructions:  Your physician recommends that you continue on your current medications as directed. Please refer to the Current Medication list given to you today. Nitroglycerin sent in today to patients requested pharmacy.  Labwork: Patient will get lab in August at PCP and have faxed to office @ [phone].  Testing/Procedures: None  Follow-Up: Your physician wants you to follow-up in: 6 months with Dr. Angelena Form. You will receive a reminder letter in the mail two months in advance. If you don't receive a letter, please call our office to schedule the follow-up appointment.   Any Other Special Instructions Will Be Listed Below (If Applicable).     If you need a refill on your cardiac medications before your next appointment, please call your pharmacy.

## 2018-10-12 DIAGNOSIS — I2510 Atherosclerotic heart disease of native coronary artery without angina pectoris: Secondary | ICD-10-CM | POA: Diagnosis not present

## 2018-10-12 DIAGNOSIS — M810 Age-related osteoporosis without current pathological fracture: Secondary | ICD-10-CM | POA: Diagnosis not present

## 2018-10-21 ENCOUNTER — Other Ambulatory Visit: Payer: Self-pay | Admitting: Cardiovascular Disease

## 2018-11-08 DIAGNOSIS — M810 Age-related osteoporosis without current pathological fracture: Secondary | ICD-10-CM | POA: Diagnosis not present

## 2019-01-19 ENCOUNTER — Other Ambulatory Visit: Payer: Self-pay | Admitting: Cardiovascular Disease

## 2019-01-19 DIAGNOSIS — I2510 Atherosclerotic heart disease of native coronary artery without angina pectoris: Secondary | ICD-10-CM

## 2019-01-20 DIAGNOSIS — J449 Chronic obstructive pulmonary disease, unspecified: Secondary | ICD-10-CM | POA: Diagnosis not present

## 2019-01-20 DIAGNOSIS — Z0000 Encounter for general adult medical examination without abnormal findings: Secondary | ICD-10-CM | POA: Diagnosis not present

## 2019-01-20 DIAGNOSIS — I252 Old myocardial infarction: Secondary | ICD-10-CM | POA: Diagnosis not present

## 2019-01-20 DIAGNOSIS — M810 Age-related osteoporosis without current pathological fracture: Secondary | ICD-10-CM | POA: Diagnosis not present

## 2019-01-20 DIAGNOSIS — G43109 Migraine with aura, not intractable, without status migrainosus: Secondary | ICD-10-CM | POA: Diagnosis not present

## 2019-01-20 DIAGNOSIS — I2510 Atherosclerotic heart disease of native coronary artery without angina pectoris: Secondary | ICD-10-CM | POA: Diagnosis not present

## 2019-01-28 ENCOUNTER — Other Ambulatory Visit: Payer: Self-pay

## 2019-01-28 ENCOUNTER — Ambulatory Visit (INDEPENDENT_AMBULATORY_CARE_PROVIDER_SITE_OTHER): Payer: Medicare Other | Admitting: Emergency Medicine

## 2019-01-28 ENCOUNTER — Encounter: Payer: Self-pay | Admitting: Emergency Medicine

## 2019-01-28 DIAGNOSIS — J4520 Mild intermittent asthma, uncomplicated: Secondary | ICD-10-CM | POA: Diagnosis not present

## 2019-01-28 DIAGNOSIS — I2510 Atherosclerotic heart disease of native coronary artery without angina pectoris: Secondary | ICD-10-CM | POA: Diagnosis not present

## 2019-01-28 DIAGNOSIS — R059 Cough, unspecified: Secondary | ICD-10-CM

## 2019-01-28 DIAGNOSIS — J301 Allergic rhinitis due to pollen: Secondary | ICD-10-CM

## 2019-01-28 DIAGNOSIS — R0902 Hypoxemia: Secondary | ICD-10-CM | POA: Diagnosis not present

## 2019-01-28 DIAGNOSIS — R05 Cough: Secondary | ICD-10-CM | POA: Diagnosis not present

## 2019-01-28 NOTE — Assessment & Plan Note (Signed)
She states that she has noticed hypoxemic episodes with her oximeter at home and may have seen some lows with her PCP as well.  I have never seen her lower than 93% at rest.  We will perform walking oximetry today to see if she has occult desaturation.

## 2019-01-28 NOTE — Assessment & Plan Note (Signed)
Currently untreated and likely the most significant contributor to her chronic cough.  We will start her on loratadine, fluticasone nasal spray.  She is also interested in trying to start nasal saline rinses.  If she does not get any improvement in the next month then she will call us.  At that point we will start her on Singulair to see if this gives any extra benefit.

## 2019-01-28 NOTE — Assessment & Plan Note (Addendum)
Multifactorial, driven principally by her allergic rhinitis which were going to try and treat more effectively as above.  Also she has mild intermittent asthma and sometimes her spells of coughing they respond to albuterol.  I have asked her to be mindful of this and use the albuterol if needed.  She has been treated empirically before with a PPI without any clinical benefit.

## 2019-01-28 NOTE — Progress Notes (Signed)
  Subjective:    Patient ID: [name], female    DOB: Dec 30, 1945, 73 y.o.   MRN: [id number]  HPI Comments:   ROV 01/28/2019 --[name] is 67, follows up today for her history of chronic cough, dyspnea with mild obstructive lung disease.  No real benefit from breo so stopped it. She does have labile allergies, congestion - more sneezing, drainage. Her chronic cough has increased some - minimally productive. She has albuterol, last used it when she had exertional sob, has also used for coughing spells. She is getting the flu shot next week. She mentions today that she has seen low saturations on RA at home and at her PCP before. I looked back through our notes over the years - no documented SpO2 < 93% that I can find.    Objective:   Physical Exam Vitals:   01/28/19 0959  BP: 120/62  Pulse: 68  SpO2: 96%   Vitals:   01/28/19 0959  BP: 120/62  Pulse: 68  SpO2: 96%  Weight: 113 lb (51.3 kg)  Height: 5\' 3"  (1.6 m)   Gen: Pleasant, well-nourished, in no distress,  normal affect  ENT: No lesions,  mouth clear,  oropharynx clear, no postnasal drip  Neck: No JVD, no stridor  Lungs: No use of accessory muscles, clear without rales or rhonchi  Cardiovascular: RRR, heart sounds normal, no murmur or gallops, no peripheral edema  Musculoskeletal: No deformities, no cyanosis or clubbing  Neuro: alert, non focal  Skin: Warm, no lesions or rash     Assessment & Plan:  Mild intermittent asthma Suspect that this is at least in part responsible for her spells of cough.  She also has some exertional dyspnea and has gotten some relief from albuterol.  I do not think she needs a schedule bronchodilator or ICS given the infrequency of her albuterol use.  She can continue to use this as needed  Allergic rhinitis Currently untreated and likely the most significant contributor to her chronic cough.  We will start her on loratadine, fluticasone nasal spray.  She is also interested in trying  to start nasal saline rinses.  If she does not get any improvement in the next month then she will call us.  At that point we will start her on Singulair to see if this gives any extra benefit.  COUGH, CHRONIC Multifactorial, driven principally by her allergic rhinitis which were going to try and treat more effectively as above.  Also she has mild intermittent asthma and sometimes her spells of coughing they respond to albuterol.  I have asked her to be mindful of this and use the albuterol if needed.  She has been treated empirically before with a PPI without any clinical benefit.  Hypoxemia She states that she has noticed hypoxemic episodes with her oximeter at home and may have seen some lows with her PCP as well.  I have never seen her lower than 93% at rest.  We will perform walking oximetry today to see if she has occult desaturation.  Baltazar Apo, MD, PhD 01/28/2019, 10:34 AM [name] [phone] or if no answer [phone]

## 2019-01-28 NOTE — Assessment & Plan Note (Signed)
Suspect that this is at least in part responsible for her spells of cough.  She also has some exertional dyspnea and has gotten some relief from albuterol.  I do not think she needs a schedule bronchodilator or ICS given the infrequency of her albuterol use.  She can continue to use this as needed

## 2019-01-28 NOTE — Patient Instructions (Addendum)
We will start loratadine 10 mg once daily Please start fluticasone nasal spray, 2 sprays each nostril once daily. You can try using nasal saline rinses (Neti-pot) once daily to see if this helps with congestion, allergy control, cough burden If you continue to have nasal congestion contributing to cough after about 1 month then please call us.  At that time we will try adding Singulair 10 mg once each evening to the regimen to see if it helps. Keep albuterol available use 2 puffs if needed for shortness of breath, spells of coughing Get your flu shot this week as planned Your oxygen level is in the normal range with walking and at rest.  This is good news. Follow with Dr. Lamonte Sakai in 12 months or sooner if you have any problems.

## 2019-01-31 DIAGNOSIS — Z23 Encounter for immunization: Secondary | ICD-10-CM | POA: Diagnosis not present

## 2019-03-08 DIAGNOSIS — Z1389 Encounter for screening for other disorder: Secondary | ICD-10-CM | POA: Diagnosis not present

## 2019-03-08 DIAGNOSIS — Z1231 Encounter for screening mammogram for malignant neoplasm of breast: Secondary | ICD-10-CM | POA: Diagnosis not present

## 2019-03-08 DIAGNOSIS — Z01419 Encounter for gynecological examination (general) (routine) without abnormal findings: Secondary | ICD-10-CM | POA: Diagnosis not present

## 2019-04-01 DIAGNOSIS — Z20828 Contact with and (suspected) exposure to other viral communicable diseases: Secondary | ICD-10-CM | POA: Diagnosis not present

## 2019-04-09 DIAGNOSIS — U071 COVID-19: Secondary | ICD-10-CM | POA: Diagnosis not present

## 2019-05-09 ENCOUNTER — Encounter: Payer: Self-pay | Admitting: Cardiovascular Disease

## 2019-05-09 ENCOUNTER — Other Ambulatory Visit: Payer: Self-pay

## 2019-05-09 ENCOUNTER — Ambulatory Visit (INDEPENDENT_AMBULATORY_CARE_PROVIDER_SITE_OTHER): Payer: Medicare Other | Admitting: Cardiovascular Disease

## 2019-05-09 VITALS — BP 124/68 | HR 81 | Ht 63.0 in | Wt 114.8 lb

## 2019-05-09 DIAGNOSIS — I2510 Atherosclerotic heart disease of native coronary artery without angina pectoris: Secondary | ICD-10-CM

## 2019-05-09 NOTE — Progress Notes (Signed)
Chief Complaint  Patient presents with  . Follow-up    CAD   History of Present Illness: 74 yo female with history of CAD, COPD and GERD here today for cardiac follow up. She had a NSTEMI in 2012 and was found to have severe disease in the mid LAD and mid RCA. A drug eluting stent was placed in the mid LAD and a drug eluting stent was placed in the mid RCA. Echo February 2018 with normal LV size and function. She called our office with c/o palpitations 10/07/16. Cardiac monitor June 2018 with sinus rhythm, rare PVCs, frequent PACs with several 6 beat runs of SVT. Nuclear stress test in January 2019 with no ischemia. This was arranged given c/o of dyspnea and fatigue. Her fatigue did not resolve off of Crestor. She is now on Lipitor. She followed up in the pulmonary office and is felt to have mild COPD. Cardiac cath August 2019 with patent LAD and RCA stents, severe stenosis in a small caliber obtuse marginal branch that was too small for PCI. She did not tolerate Toprol or Imdur.   She is here today for follow up. The patient denies any chest pain, palpitations, lower extremity edema, orthopnea, PND, dizziness, near syncope or syncope. Still with chronic dyspnea. O2 sats fall to 88% at home. Followed in the pulmonary office for COPD.   Primary Care Physician: Patient, No Pcp Per  Past Medical History:  Diagnosis Date  . Allergy   . Arthritis   . COPD (chronic obstructive pulmonary disease) ([facility])   . Coronary artery disease August 2012   Drug eluting stent mid LAD, drug eluting stent mid RCA  . Depression   . Emphysema   . Heart attack [facility]) August 2012  . Migraine   . Osteoporosis   . TIA (transient ischemic attack) 1995    Past Surgical History:  Procedure Laterality Date  . APPENDECTOMY  01/1956  . CATARACT EXTRACTION    . CORONARY ANGIOPLASTY WITH STENT PLACEMENT  2012  . FOOT SURGERY Bilateral   . HAND SURGERY Left   . LEFT HEART CATH AND CORONARY ANGIOGRAPHY N/A 12/18/2017    Procedure: LEFT HEART CATH AND CORONARY ANGIOGRAPHY;  Surgeon: Burnell Blanks, MD;  Location: [facility];  Service: Cardiovascular;  Laterality: N/A;  . NASAL SEPTUM SURGERY    . SHOULDER SURGERY Left    frozen shoulder  . THORACOTOMY    . TONSILLECTOMY      Current Outpatient Medications  Medication Sig Dispense Refill  . aspirin EC 81 MG tablet Take 81 mg by mouth daily.    Marland Kitchen atorvastatin (LIPITOR) 40 MG tablet TAKE 1 TABLET BY MOUTH EVERY DAY 90 tablet 3  . Cholecalciferol (VITAMIN D) 2000 UNITS CAPS Take 2,000 Units by mouth daily.     . clopidogrel (PLAVIX) 75 MG tablet TAKE 1 TABLET BY MOUTH EVERY DAY 90 tablet 1  . denosumab (PROLIA) 60 MG/ML SOLN injection Inject 60 mg into the skin every 6 (six) months. Administer in upper arm, thigh, or abdomen    . loratadine (CLARITIN) 10 MG tablet Take 10 mg by mouth daily.    . nitroGLYCERIN (NITROSTAT) 0.4 MG SL tablet PLACE 1 TABLET (0.4 MG TOTAL) UNDER THE TONGUE EVERY 5 (FIVE) MINUTES AS NEEDED FOR CHEST PAIN. 25 tablet 5  . sertraline (ZOLOFT) 100 MG tablet Take 50 mg by mouth daily.     Marland Kitchen topiramate (TOPAMAX) 100 MG tablet Take 100 mg by mouth daily.     Marland Kitchen  tretinoin (RETIN-A) 0.05 % cream Apply 1 application topically as needed.    . zolpidem (AMBIEN) 10 MG tablet Take 10 mg by mouth at bedtime as needed for sleep.      Current Facility-Administered Medications  Medication Dose Route Frequency Provider Last Rate Last Admin  . 0.9 %  sodium chloride infusion  500 mL Intravenous Continuous Irene Shipper, MD        Allergies  Allergen Reactions  . Naproxen Hives  . Parabens Other (See Comments)    PT IS NOT SURE OF REACTION  . Estradiol Hives  . Tramadol Hcl Nausea And Vomiting    Social History   Socioeconomic History  . Marital status: Married    Spouse name: Not on file  . Number of children: 2  . Years of education: Not on file  . Highest education level: Not on file  Occupational History  .  Occupation: retired  Tobacco Use  . Smoking status: Former Smoker    Packs/day: 1.00    Years: 10.00    Pack years: 10.00    Types: Cigarettes    Quit date: 05/06/1971    Years since quitting: 48.0  . Smokeless tobacco: Never Used  Substance and Sexual Activity  . Alcohol use: No  . Drug use: No  . Sexual activity: Not on file  Other Topics Concern  . Not on file  Social History Narrative  . Not on file   Social Determinants of Health   Financial Resource Strain:   . Difficulty of Paying Living Expenses: Not on file  Food Insecurity:   . Worried About Charity fundraiser in the Last Year: Not on file  . Ran Out of Food in the Last Year: Not on file  Transportation Needs:   . Lack of Transportation (Medical): Not on file  . Lack of Transportation (Non-Medical): Not on file  Physical Activity:   . Days of Exercise per Week: Not on file  . Minutes of Exercise per Session: Not on file  Stress:   . Feeling of Stress : Not on file  Social Connections:   . Frequency of Communication with Friends and Family: Not on file  . Frequency of Social Gatherings with Friends and Family: Not on file  . Attends Religious Services: Not on file  . Active Member of Clubs or Organizations: Not on file  . Attends Archivist Meetings: Not on file  . Marital Status: Not on file  Intimate Partner Violence:   . Fear of Current or Ex-Partner: Not on file  . Emotionally Abused: Not on file  . Physically Abused: Not on file  . Sexually Abused: Not on file    Family History  Problem Relation Age of Onset  . Heart disease Paternal Grandmother   . Heart disease Paternal Grandfather   . Rheum arthritis Maternal Grandmother   . Diabetes Maternal Grandmother   . Rheum arthritis Maternal Grandfather   . Lung cancer Maternal Uncle   . Diabetes Mother   . Diabetes Maternal Aunt     Review of Systems:  As stated in the HPI and otherwise negative.   BP 124/68   Pulse 81   Ht 5\' 3"  (1.6  m)   Wt 114 lb 12.8 oz (52.1 kg)   SpO2 97%   BMI 20.34 kg/m   Physical Examination:  General: Well developed, well nourished, NAD  HEENT: OP clear, mucus membranes moist  SKIN: warm, dry. No rashes. Neuro: No focal  deficits  Musculoskeletal: Muscle strength 5/5 all ext  Psychiatric: Mood and affect normal  Neck: No JVD, no carotid bruits, no thyromegaly, no lymphadenopathy.  Lungs:Clear bilaterally, no wheezes, rhonci, crackles Cardiovascular: Regular rate and rhythm. No murmurs, gallops or rubs. Abdomen:Soft. Bowel sounds present. Non-tender.  Extremities: No lower extremity edema. Pulses are 2 + in the bilateral DP/PT.  Echo February 2018: Left ventricle: The cavity size was normal. Wall thickness was   normal. Systolic function was normal. The estimated ejection   fraction was in the range of 55% to 60%. Doppler parameters are   consistent with abnormal left ventricular relaxation (grade 1   diastolic dysfunction).  EKG:  EKG is ordered today. The ekg ordered today demonstrates   Recent Labs: No results found for requested labs within last 8760 hours.   Lipid Panel Primary care:    Wt Readings from Last 3 Encounters:  05/09/19 114 lb 12.8 oz (52.1 kg)  01/28/19 113 lb (51.3 kg)  08/24/18 115 lb (52.2 kg)     Other studies Reviewed: Additional studies/ records that were reviewed today include: . Review of the above records demonstrates:    Assessment and Plan:   1. CAD without angina:  No chest pain. Continue ASA, Plavix and statin. She did not tolerate Toprol due to fatigue.   2. Premature atrial contractions/SVT: No palpitations.   Current medicines are reviewed at length with the patient today.  The patient does not have concerns regarding medicines.  The following changes have been made:  no change  Labs/ tests ordered today include:   Orders Placed This Encounter  Procedures  . EKG 12-Lead    Disposition:   FU with me in one  year  Signed, Lauree Chandler, MD 05/09/2019 4:26 PM    [address], [place], [place]  [address] Phone: [phone]; Fax: [phone]

## 2019-05-09 NOTE — Patient Instructions (Signed)
Medication Instructions:  No changes *If you need a refill on your cardiac medications before your next appointment, please call your pharmacy*  Lab Work: none If you have labs (blood work) drawn today and your tests are completely normal, you will receive your results only by: . MyChart Message (if you have MyChart) OR . A paper copy in the mail If you have any lab test that is abnormal or we need to change your treatment, we will call you to review the results.  Testing/Procedures: none  Follow-Up: At [facility], you and your health needs are our priority.  As part of our continuing mission to provide you with exceptional heart care, we have created designated Provider Care Teams.  These Care Teams include your primary Cardiologist (physician) and Advanced Practice Providers (APPs -  Physician Assistants and Nurse Practitioners) who all work together to provide you with the care you need, when you need it.  Your next appointment:   12 month(s)  The format for your next appointment:   Either In Person or Virtual  Provider:   Christopher McAlhany, MD  Other Instructions   

## 2019-07-13 ENCOUNTER — Other Ambulatory Visit: Payer: Self-pay | Admitting: Cardiovascular Disease

## 2019-07-13 DIAGNOSIS — I2510 Atherosclerotic heart disease of native coronary artery without angina pectoris: Secondary | ICD-10-CM

## 2019-08-16 DIAGNOSIS — L812 Freckles: Secondary | ICD-10-CM | POA: Diagnosis not present

## 2019-08-16 DIAGNOSIS — L821 Other seborrheic keratosis: Secondary | ICD-10-CM | POA: Diagnosis not present

## 2019-08-16 DIAGNOSIS — D1801 Hemangioma of skin and subcutaneous tissue: Secondary | ICD-10-CM | POA: Diagnosis not present

## 2019-08-16 DIAGNOSIS — L820 Inflamed seborrheic keratosis: Secondary | ICD-10-CM | POA: Diagnosis not present

## 2019-09-07 DIAGNOSIS — Z5181 Encounter for therapeutic drug level monitoring: Secondary | ICD-10-CM | POA: Diagnosis not present

## 2019-09-07 DIAGNOSIS — M810 Age-related osteoporosis without current pathological fracture: Secondary | ICD-10-CM | POA: Diagnosis not present

## 2019-09-09 DIAGNOSIS — M810 Age-related osteoporosis without current pathological fracture: Secondary | ICD-10-CM | POA: Diagnosis not present

## 2019-10-10 ENCOUNTER — Other Ambulatory Visit: Payer: Self-pay | Admitting: Cardiovascular Disease

## 2020-01-23 DIAGNOSIS — I2510 Atherosclerotic heart disease of native coronary artery without angina pectoris: Secondary | ICD-10-CM | POA: Diagnosis not present

## 2020-01-23 DIAGNOSIS — G43109 Migraine with aura, not intractable, without status migrainosus: Secondary | ICD-10-CM | POA: Diagnosis not present

## 2020-01-23 DIAGNOSIS — J449 Chronic obstructive pulmonary disease, unspecified: Secondary | ICD-10-CM | POA: Diagnosis not present

## 2020-01-23 DIAGNOSIS — M810 Age-related osteoporosis without current pathological fracture: Secondary | ICD-10-CM | POA: Diagnosis not present

## 2020-01-23 DIAGNOSIS — G4700 Insomnia, unspecified: Secondary | ICD-10-CM | POA: Diagnosis not present

## 2020-01-23 DIAGNOSIS — Z23 Encounter for immunization: Secondary | ICD-10-CM | POA: Diagnosis not present

## 2020-01-23 DIAGNOSIS — Z0000 Encounter for general adult medical examination without abnormal findings: Secondary | ICD-10-CM | POA: Diagnosis not present

## 2020-01-30 ENCOUNTER — Encounter: Payer: Self-pay | Admitting: Emergency Medicine

## 2020-01-30 ENCOUNTER — Other Ambulatory Visit: Payer: Self-pay

## 2020-01-30 ENCOUNTER — Ambulatory Visit (INDEPENDENT_AMBULATORY_CARE_PROVIDER_SITE_OTHER): Payer: Medicare Other | Admitting: Emergency Medicine

## 2020-01-30 DIAGNOSIS — I2510 Atherosclerotic heart disease of native coronary artery without angina pectoris: Secondary | ICD-10-CM | POA: Diagnosis not present

## 2020-01-30 DIAGNOSIS — J4520 Mild intermittent asthma, uncomplicated: Secondary | ICD-10-CM

## 2020-01-30 DIAGNOSIS — R05 Cough: Secondary | ICD-10-CM

## 2020-01-30 DIAGNOSIS — J301 Allergic rhinitis due to pollen: Secondary | ICD-10-CM

## 2020-01-30 DIAGNOSIS — R059 Cough, unspecified: Secondary | ICD-10-CM

## 2020-01-30 MED ORDER — ALBUTEROL SULFATE HFA 108 (90 BASE) MCG/ACT IN AERS: 2.0000 | INHALATION_SPRAY | Freq: Four times a day (QID) | RESPIRATORY_TRACT | 6 refills | Status: DC | PRN

## 2020-01-30 NOTE — Assessment & Plan Note (Signed)
Continue loratadine, fluticasone nasal spray 

## 2020-01-30 NOTE — Assessment & Plan Note (Addendum)
She has some dyspnea that is associated with her daily episodes of cough.  Believe she needs to be back on albuterol or at least to have this available.  Discussed with her today.  She will keep track of whether this helps her symptoms  Start using albuterol 2 puffs up to every 4 hours if needed for shortness of breath.  You may decide to use 2 puffs prior to exertion to see if this helps prevent breathing difficulty.  Keep track of how the albuterol helps you so that we can talk about it next time. If continue to have episodes of short windedness then we will discuss other possible testing including possible repeat breathing tests, chest x-ray COVID-19 and flu vaccine are both up-to-date Follow with Dr Lamonte Sakai in 3 months or sooner if you have any problems.

## 2020-01-30 NOTE — Progress Notes (Signed)
°  Subjective:    Patient ID: [name], female    DOB: [birth year], 74 y.o.   MRN: [id number]  HPI Comments:   ROV 01/28/2019 --Ms. Myhre is 46, follows up today for her history of chronic cough, dyspnea with mild obstructive lung disease.  No real benefit from breo so stopped it. She does have labile allergies, congestion - more sneezing, drainage. Her chronic cough has increased some - minimally productive. She has albuterol, last used it when she had exertional sob, has also used for coughing spells. She is getting the flu shot next week. She mentions today that she has seen low saturations on RA at home and at her PCP before. I looked back through our notes over the years - no documented SpO2 < 93% that I can find.   ROV 01/30/20 --74 year old woman with a history of mild restrictive lung disease and chronic cough in the setting of allergic rhinitis. She had increased cough through last last Spring and then better. Now over the last 2 months cough freq higher. Dry cough. Happens every day - random times, not related to position or exercise. Can be associated with some congestion, sometimes sneezing. She is on loratadine, nasal steroid.  COVID vaccine up to date.  Flu shot up to date.   PFTs from 06/30/2017 reviewed by me, show mild obstruction with a borderline bronchodilator response, normal lung volumes, decreased diffusion capacity.    Objective:   Physical Exam Vitals:   01/30/20 1012  BP: 120/76  Pulse: 80  Temp: 97.9 F (36.6 C)  SpO2: 91%    Gen: Pleasant, thin, in no distress,  normal affect  ENT: No lesions,  mouth clear,  oropharynx clear, no postnasal drip  Neck: No JVD, no stridor  Lungs: No use of accessory muscles, clear without rales or rhonchi  Cardiovascular: RRR, heart sounds normal, no murmur or gallops, no peripheral edema  Musculoskeletal: No deformities, no cyanosis or clubbing  Neuro: alert, non focal  Skin: Warm, no lesions or rash      Assessment & Plan:  Mild intermittent asthma She has some dyspnea that is associated with her daily episodes of cough.  Believe she needs to be back on albuterol or at least to have this available.  Discussed with her today.  She will keep track of whether this helps her symptoms  Start using albuterol 2 puffs up to every 4 hours if needed for shortness of breath.  You may decide to use 2 puffs prior to exertion to see if this helps prevent breathing difficulty.  Keep track of how the albuterol helps you so that we can talk about it next time. If continue to have episodes of short windedness then we will discuss other possible testing including possible repeat breathing tests, chest x-ray COVID-19 and flu vaccine are both up-to-date Follow with Dr Lamonte Sakai in 3 months or sooner if you have any problems.  Allergic rhinitis Continue loratadine, fluticasone nasal spray  COUGH, CHRONIC Denies any GERD.  She does have some allergic symptoms on good regimen.  Continue same  Baltazar Apo, MD, PhD 01/30/2020, 10:30 AM [facility] [phone] or if no answer [phone]

## 2020-01-30 NOTE — Patient Instructions (Signed)
Please continue loratadine and fluticasone nasal spray as you have been taking them. Start using albuterol 2 puffs up to every 4 hours if needed for shortness of breath.  You may decide to use 2 puffs prior to exertion to see if this helps prevent breathing difficulty.  Keep track of how the albuterol helps you so that we can talk about it next time. If continue to have episodes of short windedness then we will discuss other possible testing including possible repeat breathing tests, chest x-ray COVID-19 and flu vaccine are both up-to-date Follow with Dr Lamonte Sakai in 3 months or sooner if you have any problems.

## 2020-01-30 NOTE — Addendum Note (Signed)
Addended by: Gavin Potters R on: 01/30/2020 10:48 AM   Modules accepted: Orders

## 2020-01-30 NOTE — Assessment & Plan Note (Signed)
Denies any GERD.  She does have some allergic symptoms on good regimen.  Continue same

## 2020-02-24 DIAGNOSIS — M25532 Pain in left wrist: Secondary | ICD-10-CM | POA: Diagnosis not present

## 2020-03-01 DIAGNOSIS — M25532 Pain in left wrist: Secondary | ICD-10-CM | POA: Diagnosis not present

## 2020-03-12 DIAGNOSIS — R109 Unspecified abdominal pain: Secondary | ICD-10-CM | POA: Diagnosis not present

## 2020-03-13 DIAGNOSIS — Z23 Encounter for immunization: Secondary | ICD-10-CM | POA: Diagnosis not present

## 2020-03-15 DIAGNOSIS — M25532 Pain in left wrist: Secondary | ICD-10-CM | POA: Diagnosis not present

## 2020-03-21 ENCOUNTER — Other Ambulatory Visit: Payer: Self-pay

## 2020-03-21 ENCOUNTER — Ambulatory Visit (INDEPENDENT_AMBULATORY_CARE_PROVIDER_SITE_OTHER): Payer: Medicare Other | Admitting: Cardiovascular Disease

## 2020-03-21 ENCOUNTER — Encounter: Payer: Self-pay | Admitting: Cardiovascular Disease

## 2020-03-21 VITALS — BP 122/70 | HR 81 | Ht 63.0 in | Wt 119.2 lb

## 2020-03-21 DIAGNOSIS — M810 Age-related osteoporosis without current pathological fracture: Secondary | ICD-10-CM | POA: Diagnosis not present

## 2020-03-21 DIAGNOSIS — I2510 Atherosclerotic heart disease of native coronary artery without angina pectoris: Secondary | ICD-10-CM | POA: Diagnosis not present

## 2020-03-21 MED ORDER — NITROGLYCERIN 0.4 MG SL SUBL
SUBLINGUAL_TABLET | SUBLINGUAL | 3 refills | Status: DC
Start: 2020-03-21 — End: 2021-03-19

## 2020-03-21 MED ORDER — CLOPIDOGREL BISULFATE 75 MG PO TABS: 75.0000 mg | ORAL_TABLET | Freq: Every day | ORAL | 3 refills | Status: DC

## 2020-03-21 NOTE — Patient Instructions (Signed)
Medication Instructions:  Your physician has recommended you make the following change in your medication:  1.) stop aspirin  *If you need a refill on your cardiac medications before your next appointment, please call your pharmacy*   Lab Work: none If you have labs (blood work) drawn today and your tests are completely normal, you will receive your results only by: Marland Kitchen MyChart Message (if you have MyChart) OR . A paper copy in the mail If you have any lab test that is abnormal or we need to change your treatment, we will call you to review the results.   Testing/Procedures: none   Follow-Up: At [facility], you and your health needs are our priority.  As part of our continuing mission to provide you with exceptional heart care, we have created designated Provider Care Teams.  These Care Teams include your primary Cardiologist (physician) and Advanced Practice Providers (APPs -  Physician Assistants and Nurse Practitioners) who all work together to provide you with the care you need, when you need it.   Your next appointment:   12 month(s)  The format for your next appointment:   In Person  Provider:   You may see Lauree Chandler, MD or one of the following Advanced Practice Providers on your designated Care Team:    Richardson Dopp, PA-C  Robbie Lis, [place]    Other Instructions

## 2020-03-21 NOTE — Progress Notes (Addendum)
Chief Complaint  Patient presents with  . Follow-up    CAD   History of Present Illness: 74 yo female with history of CAD, COPD and GERD here today for cardiac follow up. She had a NSTEMI in 2012 and was found to have severe disease in the mid LAD and mid RCA. A drug eluting stent was placed in the mid LAD and a drug eluting stent was placed in the mid RCA. Echo February 2018 with normal LV size and function. She called our office with c/o palpitations 10/07/16. Cardiac monitor June 2018 with sinus rhythm, rare PVCs, frequent PACs with several 6 beat runs of SVT. Nuclear stress test in January 2019 with no ischemia. This was arranged given c/o of dyspnea and fatigue. Her fatigue did not resolve off of Crestor. She is now on Lipitor. She followed up in the pulmonary office and is felt to haveCOPD. Cardiac cath August 2019 with patent LAD and RCA stents, severe stenosis in a small caliber obtuse marginal branch that was too small for PCI. She did not tolerate Toprol or Imdur.   She is here today for follow up. The patient denies any chest pain, dyspnea, palpitations, lower extremity edema, orthopnea, PND, dizziness, near syncope or syncope.   Primary Care Physician: Orpah Melter, MD  Past Medical History:  Diagnosis Date  . Allergy   . Arthritis   . COPD (chronic obstructive pulmonary disease) ([place])   . Coronary artery disease August 2012   Drug eluting stent mid LAD, drug eluting stent mid RCA  . Depression   . Emphysema   . Heart attack [facility]) August 2012  . Migraine   . Osteoporosis   . TIA (transient ischemic attack) 1995    Past Surgical History:  Procedure Laterality Date  . APPENDECTOMY  01/1956  . CATARACT EXTRACTION    . CORONARY ANGIOPLASTY WITH STENT PLACEMENT  2012  . FOOT SURGERY Bilateral   . HAND SURGERY Left   . LEFT HEART CATH AND CORONARY ANGIOGRAPHY N/A 12/18/2017   Procedure: LEFT HEART CATH AND CORONARY ANGIOGRAPHY;  Surgeon: Burnell Blanks, MD;   Location: [place] [facility];  Service: Cardiovascular;  Laterality: N/A;  . NASAL SEPTUM SURGERY    . SHOULDER SURGERY Left    frozen shoulder  . THORACOTOMY    . TONSILLECTOMY      Current Outpatient Medications  Medication Sig Dispense Refill  . albuterol (VENTOLIN HFA) 108 (90 Base) MCG/ACT inhaler Inhale 2 puffs into the lungs every 6 (six) hours as needed for wheezing or shortness of breath. 8 g 6  . atorvastatin (LIPITOR) 40 MG tablet TAKE 1 TABLET BY MOUTH EVERY DAY 90 tablet 1  . Cholecalciferol (VITAMIN D) 2000 UNITS CAPS Take 2,000 Units by mouth daily.     . clopidogrel (PLAVIX) 75 MG tablet Take 1 tablet (75 mg total) by mouth daily. 90 tablet 3  . cyclobenzaprine (FLEXERIL) 10 MG tablet Take 10 mg by mouth at bedtime.    Marland Kitchen denosumab (PROLIA) 60 MG/ML SOLN injection Inject 60 mg into the skin every 6 (six) months. Administer in upper arm, thigh, or abdomen    . loratadine (CLARITIN) 10 MG tablet Take 10 mg by mouth daily.    . nitroGLYCERIN (NITROSTAT) 0.4 MG SL tablet PLACE 1 TABLET (0.4 MG TOTAL) UNDER THE TONGUE EVERY 5 (FIVE) MINUTES AS NEEDED FOR CHEST PAIN. 25 tablet 3  . sertraline (ZOLOFT) 100 MG tablet Take 50 mg by mouth daily.     Marland Kitchen  topiramate (TOPAMAX) 100 MG tablet Take 100 mg by mouth daily.     Marland Kitchen tretinoin (RETIN-A) 0.05 % cream Apply 1 application topically as needed.    . zolpidem (AMBIEN) 10 MG tablet Take 10 mg by mouth at bedtime as needed for sleep.      Current Facility-Administered Medications  Medication Dose Route Frequency Provider Last Rate Last Admin  . 0.9 %  sodium chloride infusion  500 mL Intravenous Continuous Irene Shipper, MD        Allergies  Allergen Reactions  . Naproxen Hives  . Parabens Other (See Comments)    PT IS NOT SURE OF REACTION  . Estradiol Hives  . Tramadol Hcl Nausea And Vomiting    Social History   Socioeconomic History  . Marital status: Married    Spouse name: Not on file  . Number of children: 2  . Years of  education: Not on file  . Highest education level: Not on file  Occupational History  . Occupation: retired  Tobacco Use  . Smoking status: Former Smoker    Packs/day: 1.00    Years: 10.00    Pack years: 10.00    Types: Cigarettes    Quit date: 05/06/1971    Years since quitting: 48.9  . Smokeless tobacco: Never Used  Vaping Use  . Vaping Use: Never used  Substance and Sexual Activity  . Alcohol use: No  . Drug use: No  . Sexual activity: Not on file  Other Topics Concern  . Not on file  Social History Narrative  . Not on file   Social Determinants of Health   Financial Resource Strain:   . Difficulty of Paying Living Expenses: Not on file  Food Insecurity:   . Worried About Charity fundraiser in the Last Year: Not on file  . Ran Out of Food in the Last Year: Not on file  Transportation Needs:   . Lack of Transportation (Medical): Not on file  . Lack of Transportation (Non-Medical): Not on file  Physical Activity:   . Days of Exercise per Week: Not on file  . Minutes of Exercise per Session: Not on file  Stress:   . Feeling of Stress : Not on file  Social Connections:   . Frequency of Communication with Friends and Family: Not on file  . Frequency of Social Gatherings with Friends and Family: Not on file  . Attends Religious Services: Not on file  . Active Member of Clubs or Organizations: Not on file  . Attends Archivist Meetings: Not on file  . Marital Status: Not on file  Intimate Partner Violence:   . Fear of Current or Ex-Partner: Not on file  . Emotionally Abused: Not on file  . Physically Abused: Not on file  . Sexually Abused: Not on file    Family History  Problem Relation Age of Onset  . Heart disease Paternal Grandmother   . Heart disease Paternal Grandfather   . Rheum arthritis Maternal Grandmother   . Diabetes Maternal Grandmother   . Rheum arthritis Maternal Grandfather   . Lung cancer Maternal Uncle   . Diabetes Mother   .  Diabetes Maternal Aunt     Review of Systems:  As stated in the HPI and otherwise negative.   BP 122/70   Pulse 81   Ht 5\' 3"  (1.6 m)   Wt 119 lb 3.2 oz (54.1 kg)   SpO2 91%   BMI 21.12 kg/m  Physical Examination:  General: Well developed, well nourished, NAD  HEENT: OP clear, mucus membranes moist  SKIN: warm, dry. No rashes. Neuro: No focal deficits  Musculoskeletal: Muscle strength 5/5 all ext  Psychiatric: Mood and affect normal  Neck: No JVD, no carotid bruits, no thyromegaly, no lymphadenopathy.  Lungs:Clear bilaterally, no wheezes, rhonci, crackles Cardiovascular: Regular rate and rhythm. No murmurs, gallops or rubs. Abdomen:Soft. Bowel sounds present. Non-tender.  Extremities: No lower extremity edema. Pulses are 2 + in the bilateral DP/PT.  Echo February 2018: Left ventricle: The cavity size was normal. Wall thickness was   normal. Systolic function was normal. The estimated ejection   fraction was in the range of 55% to 60%. Doppler parameters are   consistent with abnormal left ventricular relaxation (grade 1   diastolic dysfunction).  EKG:  EKG is ordered today. The ekg ordered today demonstrates sinus  Recent Labs: No results found for requested labs within last 8760 hours.   Lipid Panel Primary care:    Wt Readings from Last 3 Encounters:  03/21/20 119 lb 3.2 oz (54.1 kg)  01/30/20 119 lb 6.4 oz (54.2 kg)  05/09/19 114 lb 12.8 oz (52.1 kg)     Other studies Reviewed: Additional studies/ records that were reviewed today include: . Review of the above records demonstrates:    Assessment and Plan:   1. CAD without angina:  She has no chest pain. Will continue Plavix and statin. Will stop ASA. She did not tolerate Toprol due to fatigue. Lipids well controlled per pt in primary care. LDL 49 August 2021.   2. Premature atrial contractions/SVT: She has no palpitations.   Current medicines are reviewed at length with the patient today.  The patient  does not have concerns regarding medicines.  The following changes have been made:  no change  Labs/ tests ordered today include:   Orders Placed This Encounter  Procedures  . EKG 12-Lead    Disposition:   FU with me in one year  Signed, Lauree Chandler, MD 03/22/2020 7:26 AM    [address], [place], [place]  [address] Phone: [phone]; Fax: [phone]

## 2020-03-27 DIAGNOSIS — Z961 Presence of intraocular lens: Secondary | ICD-10-CM | POA: Diagnosis not present

## 2020-03-27 DIAGNOSIS — H00024 Hordeolum internum left upper eyelid: Secondary | ICD-10-CM | POA: Diagnosis not present

## 2020-04-02 DIAGNOSIS — M25532 Pain in left wrist: Secondary | ICD-10-CM | POA: Diagnosis not present

## 2020-04-19 DIAGNOSIS — M25551 Pain in right hip: Secondary | ICD-10-CM | POA: Diagnosis not present

## 2020-05-02 ENCOUNTER — Ambulatory Visit: Payer: Medicare Other | Admitting: Emergency Medicine

## 2020-05-09 ENCOUNTER — Ambulatory Visit: Payer: Medicare Other | Admitting: Physician Assistant

## 2020-05-10 ENCOUNTER — Ambulatory Visit: Payer: Medicare Other | Admitting: Emergency Medicine

## 2020-06-04 ENCOUNTER — Other Ambulatory Visit: Payer: Self-pay

## 2020-06-04 ENCOUNTER — Encounter: Payer: Self-pay | Admitting: Emergency Medicine

## 2020-06-04 ENCOUNTER — Ambulatory Visit: Payer: Medicare Other | Admitting: Emergency Medicine

## 2020-06-04 DIAGNOSIS — J4520 Mild intermittent asthma, uncomplicated: Secondary | ICD-10-CM | POA: Diagnosis not present

## 2020-06-04 DIAGNOSIS — J301 Allergic rhinitis due to pollen: Secondary | ICD-10-CM | POA: Diagnosis not present

## 2020-06-04 DIAGNOSIS — R059 Cough, unspecified: Secondary | ICD-10-CM

## 2020-06-04 NOTE — Assessment & Plan Note (Addendum)
Persists.  We have tried empiric GERD therapy before without any resolution.  She is currently on loratadine and nasal steroid for rhinitis.  Sometimes when she has paroxysmal spells of coughing she benefits from albuterol.  We will continue to follow clinically, follow her chest x-ray next time.  Consider IgE, eosinophil count going forward.

## 2020-06-04 NOTE — Progress Notes (Signed)
  Subjective:    Patient ID: [name], female    DOB: 1946/03/02, 75 y.o.   MRN: [id number]  HPI Comments:   ROV 01/28/2019 --[name] is 12, follows up today for her history of chronic cough, dyspnea with mild obstructive lung disease.  No real benefit from breo so stopped it. She does have labile allergies, congestion - more sneezing, drainage. Her chronic cough has increased some - minimally productive. She has albuterol, last used it when she had exertional sob, has also used for coughing spells. She is getting the flu shot next week. She mentions today that she has seen low saturations on RA at home and at her PCP before. I looked back through our notes over the years - no documented SpO2 < 93% that I can find.   ROV 01/30/20 --75 year old woman with a history of mild restrictive lung disease and chronic cough in the setting of allergic rhinitis. She had increased cough through last last Spring and then better. Now over the last 2 months cough freq higher. Dry cough. Happens every day - random times, not related to position or exercise. Can be associated with some congestion, sometimes sneezing. She is on loratadine, nasal steroid.  COVID vaccine up to date.  Flu shot up to date.   PFTs from 06/30/2017 reviewed by me, show mild obstruction with a borderline bronchodilator response, normal lung volumes, decreased diffusion capacity.  ROV 06/04/20 --follow-up visit 75 year old woman with history of mixed restrictive and obstructive disease with mild obstruction and a borderline bronchodilator response, chronic cough in the setting of allergic rhinitis.  At her last visit in September we added back albuterol for her to use if needed.  She is also been on loratadine, nasal steroid.  She reports that her cough is still happening daily. Has used the albuterol     Objective:   Physical Exam Vitals:   06/04/20 1339  BP: 116/70  Pulse: 84  Temp: (!) 97.4 F (36.3 C)  SpO2: 92%    Gen:  Pleasant, thin, in no distress,  normal affect  ENT: No lesions,  mouth clear,  oropharynx clear, no postnasal drip  Neck: No JVD, no stridor  Lungs: No use of accessory muscles, clear without rales or rhonchi  Cardiovascular: RRR, heart sounds normal, no murmur or gallops, no peripheral edema  Musculoskeletal: No deformities, no cyanosis or clubbing  Neuro: alert, non focal  Skin: Warm, no lesions or rash     Assessment & Plan:  Mild intermittent asthma She has benefited from addition of albuterol, using about once a week.  No clear indication to start maintenance bronchodilator therapy at this time, will continue the albuterol as needed.  Allergic rhinitis Continue loratadine, nasal steroid  COUGH, CHRONIC Persists.  We have tried empiric GERD therapy before without any resolution.  She is currently on loratadine and nasal steroid for rhinitis.  Sometimes when she has paroxysmal spells of coughing she benefits from albuterol.  We will continue to follow clinically, follow her chest x-ray next time.  Consider IgE, eosinophil count going forward.  Baltazar Apo, MD, PhD 06/04/2020, 1:53 PM [facility] [phone] or if no answer [phone]

## 2020-06-04 NOTE — Assessment & Plan Note (Signed)
Continue loratadine, nasal steroid

## 2020-06-04 NOTE — Patient Instructions (Addendum)
Please continue your loratadine and nasal spray as you have been taking them. Continue to keep albuterol available use 2 puffs if needed for shortness of breath, chest tightness, wheezing, coughing spells. COVID-19 vaccine and flu vaccine are both up-to-date Follow with Dr. Lamonte Sakai in 12 months or sooner if you have any problems.

## 2020-06-04 NOTE — Assessment & Plan Note (Signed)
She has benefited from addition of albuterol, using about once a week.  No clear indication to start maintenance bronchodilator therapy at this time, will continue the albuterol as needed.

## 2020-06-18 DIAGNOSIS — Z1231 Encounter for screening mammogram for malignant neoplasm of breast: Secondary | ICD-10-CM | POA: Diagnosis not present

## 2020-06-18 DIAGNOSIS — F419 Anxiety disorder, unspecified: Secondary | ICD-10-CM | POA: Diagnosis not present

## 2020-06-18 DIAGNOSIS — Z01419 Encounter for gynecological examination (general) (routine) without abnormal findings: Secondary | ICD-10-CM | POA: Diagnosis not present

## 2020-08-15 DIAGNOSIS — L821 Other seborrheic keratosis: Secondary | ICD-10-CM | POA: Diagnosis not present

## 2020-08-15 DIAGNOSIS — H61002 Unspecified perichondritis of left external ear: Secondary | ICD-10-CM | POA: Diagnosis not present

## 2020-08-15 DIAGNOSIS — D1801 Hemangioma of skin and subcutaneous tissue: Secondary | ICD-10-CM | POA: Diagnosis not present

## 2020-08-15 DIAGNOSIS — L812 Freckles: Secondary | ICD-10-CM | POA: Diagnosis not present

## 2020-11-27 DIAGNOSIS — M810 Age-related osteoporosis without current pathological fracture: Secondary | ICD-10-CM | POA: Diagnosis not present

## 2020-11-29 DIAGNOSIS — M810 Age-related osteoporosis without current pathological fracture: Secondary | ICD-10-CM | POA: Diagnosis not present

## 2021-01-28 DIAGNOSIS — M810 Age-related osteoporosis without current pathological fracture: Secondary | ICD-10-CM | POA: Diagnosis not present

## 2021-01-28 DIAGNOSIS — I2510 Atherosclerotic heart disease of native coronary artery without angina pectoris: Secondary | ICD-10-CM | POA: Diagnosis not present

## 2021-01-28 DIAGNOSIS — Z1239 Encounter for other screening for malignant neoplasm of breast: Secondary | ICD-10-CM | POA: Diagnosis not present

## 2021-01-28 DIAGNOSIS — Z0000 Encounter for general adult medical examination without abnormal findings: Secondary | ICD-10-CM | POA: Diagnosis not present

## 2021-01-28 DIAGNOSIS — Z1211 Encounter for screening for malignant neoplasm of colon: Secondary | ICD-10-CM | POA: Diagnosis not present

## 2021-02-11 DIAGNOSIS — Z23 Encounter for immunization: Secondary | ICD-10-CM | POA: Diagnosis not present

## 2021-02-27 DIAGNOSIS — H5213 Myopia, bilateral: Secondary | ICD-10-CM | POA: Diagnosis not present

## 2021-03-11 NOTE — Progress Notes (Signed)
Cardiology Office Note    Date:  03/19/2021   ID:  [name], [name] [birth year], MRN [id number]   PCP:  Orpah Melter, [place]  Cardiologist:  Lauree Chandler, MD   Advanced Practice Provider:  No care team member to display Electrophysiologist:  None   [id number]   Chief Complaint  Patient presents with   Follow-up     History of Present Illness:  [name] is a 75 y.o. female with history of CAD status post NSTEMI 2012 with severe disease in the mid LAD and mid RCA treated with stenting.  Echo 06/2016 normal LV size and function.  Complained of palpitations 10/2016 monitor showed normal sinus rhythm with rare PVCs and frequent PACs with several 6 beat runs of SVT.  Nuclear stress test January 2019 no ischemia.   Increased angina with dyspnea on office visit 12/11/2017.  Dr. Angelena Form recommended cardiac catheterization which showed the mid LAD stent to be patent with very mild restenosis.  Diagonal branch has a moderate ostial stenosis due to jailing of the branch by the LAD stent, OM 99% stenosis too small for PCI and unchanged from last cath, patent RCA stent normal LV function.  Medical therapy recommended and low dose troprol added.  She did not tolerate Imdur or Toprol.  Patient last saw Dr. Angelena Form 03/21/2020 and was doing well.  He stopped her aspirin.  Patient comes in for f/u. Patient has chronic DOE from COPD that may have worsened some. Denies chest pain, dizziness, edema. Occasional fast heart rates that go away quickly. Patient walks 2 miles daily. No bleeding problems on Plavix. Says she's tired.  Past Medical History:  Diagnosis Date   Allergy    Arthritis    COPD (chronic obstructive pulmonary disease) ([place])    Coronary artery disease August 2012   Drug eluting stent mid LAD, drug eluting stent mid RCA   Depression    Emphysema    Heart attack [facility]) August 2012   Migraine    Osteoporosis    TIA (transient  ischemic attack) 1995    Past Surgical History:  Procedure Laterality Date   APPENDECTOMY  01/1956   CATARACT EXTRACTION     CORONARY ANGIOPLASTY WITH STENT PLACEMENT  2012   FOOT SURGERY Bilateral    HAND SURGERY Left    LEFT HEART CATH AND CORONARY ANGIOGRAPHY N/A 12/18/2017   Procedure: LEFT HEART CATH AND CORONARY ANGIOGRAPHY;  Surgeon: Burnell Blanks, MD;  Location: [place] [facility];  Service: Cardiovascular;  Laterality: N/A;   NASAL SEPTUM SURGERY     SHOULDER SURGERY Left    frozen shoulder   THORACOTOMY     TONSILLECTOMY      Current Medications: Current Meds  Medication Sig   albuterol (VENTOLIN HFA) 108 (90 Base) MCG/ACT inhaler Inhale 2 puffs into the lungs every 6 (six) hours as needed for wheezing or shortness of breath.   atorvastatin (LIPITOR) 40 MG tablet TAKE 1 TABLET BY MOUTH EVERY DAY   Cholecalciferol (VITAMIN D) 2000 UNITS CAPS Take 2,000 Units by mouth daily.   denosumab (PROLIA) 60 MG/ML SOLN injection Inject 60 mg into the skin every 6 (six) months. Administer in upper arm, thigh, or abdomen   loratadine (CLARITIN) 10 MG tablet Take 10 mg by mouth daily.   sertraline (ZOLOFT) 100 MG tablet Take 50 mg by mouth daily.    topiramate (TOPAMAX) 100 MG tablet Take 100 mg by mouth daily.  tretinoin (RETIN-A) 0.05 % cream Apply 1 application topically as needed.   zolpidem (AMBIEN) 10 MG tablet Take 10 mg by mouth at bedtime as needed for sleep.    [DISCONTINUED] clopidogrel (PLAVIX) 75 MG tablet Take 1 tablet (75 mg total) by mouth daily.   [DISCONTINUED] nitroGLYCERIN (NITROSTAT) 0.4 MG SL tablet PLACE 1 TABLET (0.4 MG TOTAL) UNDER THE TONGUE EVERY 5 (FIVE) MINUTES AS NEEDED FOR CHEST PAIN.   Current Facility-Administered Medications for the 03/19/21 encounter (Office Visit) with Imogene Burn, PA-C  Medication   0.9 %  sodium chloride infusion     Allergies:   Naproxen, Parabens, Estradiol, and Tramadol hcl   Social History   Socioeconomic  History   Marital status: Married    Spouse name: Not on file   Number of children: 2   Years of education: Not on file   Highest education level: Not on file  Occupational History   Occupation: retired  Tobacco Use   Smoking status: Former    Packs/day: 1.00    Years: 10.00    Pack years: 10.00    Types: Cigarettes    Quit date: 05/06/1971    Years since quitting: 49.9   Smokeless tobacco: Never  Vaping Use   Vaping Use: Never used  Substance and Sexual Activity   Alcohol use: No   Drug use: No   Sexual activity: Not on file  Other Topics Concern   Not on file  Social History Narrative   Not on file   Social Determinants of Health   Financial Resource Strain: Not on file  Food Insecurity: Not on file  Transportation Needs: Not on file  Physical Activity: Not on file  Stress: Not on file  Social Connections: Not on file     Family History:  The patient's  family history includes Diabetes in her maternal aunt, maternal grandmother, and mother; Heart disease in her paternal grandfather and paternal grandmother; Lung cancer in her maternal uncle; Rheum arthritis in her maternal grandfather and maternal grandmother.   ROS:   Please see the history of present illness.    ROS All other systems reviewed and are negative.   PHYSICAL EXAM:   VS:  BP 124/80   Pulse 77   Ht 5' 2.5" (1.588 m)   Wt 113 lb 6.4 oz (51.4 kg)   SpO2 91%   BMI 20.41 kg/m   Physical Exam  GEN: Thin, in no acute distress  Neck: no JVD, carotid bruits, or masses Cardiac:RRR; no murmurs, rubs, or gallops  Respiratory:  clear to auscultation bilaterally, normal work of breathing GI: soft, nontender, nondistended, + BS Ext: without cyanosis, clubbing, or edema, Good distal pulses bilaterally Neuro:  Alert and Oriented x 3 Psych: euthymic mood, full affect  Wt Readings from Last 3 Encounters:  03/19/21 113 lb 6.4 oz (51.4 kg)  06/04/20 117 lb (53.1 kg)  03/21/20 119 lb 3.2 oz (54.1 kg)       Studies/Labs Reviewed:   EKG:  EKG is  ordered today.  The ekg ordered today demonstrates NSR with PAC's  Recent Labs: No results found for requested labs within last 8760 hours.   Lipid Panel    Component Value Date/Time   CHOL 135 07/29/2017 0942   TRIG 90 07/29/2017 0942   HDL 67 07/29/2017 0942   CHOLHDL 2.0 07/29/2017 0942   CHOLHDL 2 01/23/2011 1042   VLDL 10.6 01/23/2011 1042   LDLCALC 50 07/29/2017 0942    Additional studies/  records that were reviewed today include:  Cardiac catheterization 12/18/2017 Previously placed Prox RCA to Mid RCA stent (unknown type) is widely patent. Mid RCA lesion is 20% stenosed. Ost 2nd Mrg lesion is 99% stenosed. Prox LAD lesion is 10% stenosed. Ost 1st Diag lesion is 60% stenosed. The left ventricular systolic function is normal. LV end diastolic pressure is normal. The left ventricular ejection fraction is 55-65% by visual estimate. There is no mitral valve regurgitation.   1. The LAD is a large vessel that courses to the apex. The mid LAD stent is patent with very mild restenosis. The moderate caliber diagonal branch arises from the stented segment of the LAD and has a moderate ostial stenosis due to jailing of the branch by the LAD stent.  2. The Circumflex is a small caliber vessel with a small distal obtuse marginal branch. The obtuse marginal branch has a 99% stenosis. This is too small for PCI and unchanged from her cath in 2012.  3. The RCA is a large dominant vessel with a patent mid vessel stent. The stent has no restenosis. The distal RCA has mild plaque.  4. Normal LV systolic function   Recommendations: Continue medical therapy.    Nuclear stress test 1/2019Nuclear stress EF: 78%. No wall motion abnormalities. There was no ST segment deviation noted during stress. Defect 1: There is a small defect of mild severity present in the basal anterolateral location. There is an apical anterior defect seen at rest, not  stress. Overall low risk nuclear stress test with no areas of significant ischemia identified. It is possible that the basal anterolateral defect is artifactual.   Candee Furbish, MD         Risk Assessment/Calculations:         ASSESSMENT:    1. Coronary artery disease involving native coronary artery of native heart without angina pectoris   2. SVT (supraventricular tachycardia) ([place])   3. HYPERCHOLESTEROLEMIA      PLAN:  In order of problems listed above:  CAD status post NSTEMI 2012 with severe disease in the mid LAD and mid RCA treated with stenting on Plavix, did not tolerate Toprol or Imdur NST no ischemia 2019-no angina, exercises regularly. Continue Plavix and lipitor  History of SVT-not having many symptoms, didn't tolerate metoprolol. Avoids caffeine.   Hyperlipidemia-LDL 53 01/2021  Shared Decision Making/Informed Consent        Medication Adjustments/Labs and Tests Ordered: Current medicines are reviewed at length with the patient today.  Concerns regarding medicines are outlined above.  Medication changes, Labs and Tests ordered today are listed in the Patient Instructions below. Patient Instructions  Medication Instructions:  Your physician recommends that you continue on your current medications as directed. Please refer to the Current Medication list given to you today.  *If you need a refill on your cardiac medications before your next appointment, please call your pharmacy*   Lab Work: NONE If you have labs (blood work) drawn today and your tests are completely normal, you will receive your results only by: [place] (if you have MyChart) OR A paper copy in the mail If you have any lab test that is abnormal or we need to change your treatment, we will call you to review the results.   Testing/Procedures: NONE   Follow-Up: At [facility], you and your health needs are our priority.  As part of our continuing mission to provide you with  exceptional heart care, we have created designated Provider Care Teams.  These Care Teams include your primary Cardiologist (physician) and Advanced Practice Providers (APPs -  Physician Assistants and Nurse Practitioners) who all work together to provide you with the care you need, when you need it.  We recommend signing up for the patient portal called "MyChart".  Sign up information is provided on this After Visit Summary.  MyChart is used to connect with patients for Virtual Visits (Telemedicine).  Patients are able to view lab/test results, encounter notes, upcoming appointments, etc.  Non-urgent messages can be sent to your provider as well.   To learn more about what you can do with MyChart, go to [email or web address].    Your next appointment:   1 year(s)  The format for your next appointment:   In Person  Provider:   Lauree Chandler, MD {    Signed, Cynthia Barrios, PA-C  03/19/2021 12:17 PM    [address], [place], [place]  [address] Phone: [phone]; Fax: [phone]

## 2021-03-19 ENCOUNTER — Other Ambulatory Visit: Payer: Self-pay

## 2021-03-19 ENCOUNTER — Ambulatory Visit: Payer: Medicare Other | Admitting: Physician Assistant

## 2021-03-19 ENCOUNTER — Encounter: Payer: Self-pay | Admitting: Physician Assistant

## 2021-03-19 VITALS — BP 124/80 | HR 77 | Ht 62.5 in | Wt 113.4 lb

## 2021-03-19 DIAGNOSIS — E7800 Pure hypercholesterolemia, unspecified: Secondary | ICD-10-CM

## 2021-03-19 DIAGNOSIS — I471 Supraventricular tachycardia: Secondary | ICD-10-CM

## 2021-03-19 DIAGNOSIS — I2510 Atherosclerotic heart disease of native coronary artery without angina pectoris: Secondary | ICD-10-CM | POA: Diagnosis not present

## 2021-03-19 MED ORDER — CLOPIDOGREL BISULFATE 75 MG PO TABS: 75.0000 mg | ORAL_TABLET | Freq: Every day | ORAL | 3 refills | Status: DC

## 2021-03-19 MED ORDER — NITROGLYCERIN 0.4 MG SL SUBL: SUBLINGUAL_TABLET | SUBLINGUAL | 5 refills | Status: DC

## 2021-03-19 NOTE — Patient Instructions (Signed)
Medication Instructions:  Your physician recommends that you continue on your current medications as directed. Please refer to the Current Medication list given to you today.  *If you need a refill on your cardiac medications before your next appointment, please call your pharmacy*   Lab Work: NONE If you have labs (blood work) drawn today and your tests are completely normal, you will receive your results only by: [place] (if you have MyChart) OR A paper copy in the mail If you have any lab test that is abnormal or we need to change your treatment, we will call you to review the results.   Testing/Procedures: NONE   Follow-Up: At [facility], you and your health needs are our priority.  As part of our continuing mission to provide you with exceptional heart care, we have created designated Provider Care Teams.  These Care Teams include your primary Cardiologist (physician) and Advanced Practice Providers (APPs -  Physician Assistants and Nurse Practitioners) who all work together to provide you with the care you need, when you need it.  We recommend signing up for the patient portal called "MyChart".  Sign up information is provided on this After Visit Summary.  MyChart is used to connect with patients for Virtual Visits (Telemedicine).  Patients are able to view lab/test results, encounter notes, upcoming appointments, etc.  Non-urgent messages can be sent to your provider as well.   To learn more about what you can do with MyChart, go to [email or web address].    Your next appointment:   1 year(s)  The format for your next appointment:   In Person  Provider:   Lauree Chandler, MD {

## 2021-04-05 ENCOUNTER — Other Ambulatory Visit: Payer: Self-pay | Admitting: *Deleted

## 2021-04-05 DIAGNOSIS — I2510 Atherosclerotic heart disease of native coronary artery without angina pectoris: Secondary | ICD-10-CM

## 2021-04-05 MED ORDER — CLOPIDOGREL BISULFATE 75 MG PO TABS: 75.0000 mg | ORAL_TABLET | Freq: Every day | ORAL | 3 refills | Status: DC

## 2021-04-08 DIAGNOSIS — M810 Age-related osteoporosis without current pathological fracture: Secondary | ICD-10-CM | POA: Diagnosis not present

## 2021-04-08 DIAGNOSIS — M8588 Other specified disorders of bone density and structure, other site: Secondary | ICD-10-CM | POA: Diagnosis not present

## 2021-04-16 MED ORDER — CLOPIDOGREL BISULFATE 75 MG PO TABS: 75.0000 mg | ORAL_TABLET | Freq: Every day | ORAL | 3 refills | Status: DC

## 2021-04-16 NOTE — Addendum Note (Signed)
Addended by: Carter Kitten D on: 04/16/2021 10:19 AM   Modules accepted: Orders

## 2021-05-27 DIAGNOSIS — R634 Abnormal weight loss: Secondary | ICD-10-CM | POA: Diagnosis not present

## 2021-05-27 DIAGNOSIS — M810 Age-related osteoporosis without current pathological fracture: Secondary | ICD-10-CM | POA: Diagnosis not present

## 2021-06-05 DIAGNOSIS — M810 Age-related osteoporosis without current pathological fracture: Secondary | ICD-10-CM | POA: Diagnosis not present

## 2021-06-11 ENCOUNTER — Ambulatory Visit: Payer: Medicare Other | Admitting: Emergency Medicine

## 2021-06-11 ENCOUNTER — Other Ambulatory Visit: Payer: Self-pay

## 2021-06-11 ENCOUNTER — Encounter: Payer: Self-pay | Admitting: Emergency Medicine

## 2021-06-11 DIAGNOSIS — J4520 Mild intermittent asthma, uncomplicated: Secondary | ICD-10-CM

## 2021-06-11 DIAGNOSIS — J301 Allergic rhinitis due to pollen: Secondary | ICD-10-CM | POA: Diagnosis not present

## 2021-06-11 MED ORDER — ALBUTEROL SULFATE HFA 108 (90 BASE) MCG/ACT IN AERS: 2.0000 | INHALATION_SPRAY | Freq: Four times a day (QID) | RESPIRATORY_TRACT | 6 refills | Status: DC | PRN

## 2021-06-11 NOTE — Assessment & Plan Note (Signed)
Keep your albuterol available to use 2 puffs if needed for shortness of breath, chest tightness, wheezing. Keep track of your albuterol use and symptoms.  If they are increasing please call so we can consider whether your medications need to be adjusted. Flu shot and COVID-19 vaccine are both up-to-date Follow Dr. Lamonte Sakai in 1 year or sooner if you have any problems.

## 2021-06-11 NOTE — Patient Instructions (Signed)
Please continue your loratadine 10 mg once daily. Keep your albuterol available to use 2 puffs if needed for shortness of breath, chest tightness, wheezing. Keep track of your albuterol use and symptoms.  If they are increasing please call so we can consider whether your medications need to be adjusted. Flu shot and COVID-19 vaccine are both up-to-date Follow Dr. Lamonte Sakai in 1 year or sooner if you have any problems.

## 2021-06-11 NOTE — Addendum Note (Signed)
Addended by: Gavin Potters R on: 06/11/2021 11:15 AM   Modules accepted: Orders

## 2021-06-11 NOTE — Progress Notes (Signed)
Subjective:    Patient ID: [name], female    DOB: Mar 30, 1946, 76 y.o.   MRN: [id number]  HPI Comments:   ROV 01/28/2019 --[name] is 76, follows up today for her history of chronic cough, dyspnea with mild obstructive lung disease.  No real benefit from breo so stopped it. She does have labile allergies, congestion - more sneezing, drainage. Her chronic cough has increased some - minimally productive. She has albuterol, last used it when she had exertional sob, has also used for coughing spells. She is getting the flu shot next week. She mentions today that she has seen low saturations on RA at home and at her PCP before. I looked back through our notes over the years - no documented SpO2 < 93% that I can find.   ROV 01/30/20 --76 year old woman with a history of mild restrictive lung disease and chronic cough in the setting of allergic rhinitis. She had increased cough through last last Spring and then better. Now over the last 2 months cough freq higher. Dry cough. Happens every day - random times, not related to position or exercise. Can be associated with some congestion, sometimes sneezing. She is on loratadine, nasal steroid.  COVID vaccine up to date.  Flu shot up to date.   PFTs from 06/30/2017 reviewed by me, show mild obstruction with a borderline bronchodilator response, normal lung volumes, decreased diffusion capacity.  ROV 06/04/20 --follow-up visit 76 year old woman with history of mixed restrictive and obstructive disease with mild obstruction and a borderline bronchodilator response, chronic cough in the setting of allergic rhinitis.  At her last visit in September we added back albuterol for her to use if needed.  She is also been on loratadine, nasal steroid.  She reports that her cough is still happening daily. Has used the albuterol   ROV 06/11/21 --76 year old woman with a history of mild intermittent asthma and chronic cough both in the setting of allergic rhinitis.  She  is on nasal steroid spray, loratadine.  She has albuterol that she uses as needed. Today she reports that she sneezes fairly often, no nasal obstruction or impact om her breathing. She had a severe URI in December. She is better. Is using albuterol about once a week for exertional SOB. Happens when bending, doing stairs. She does remain active, shops, does her housework.  Flu and covid shots up to date.     Objective:   Physical Exam Vitals:   06/11/21 1023  BP: 120/72  Pulse: 90  Temp: 97.8 F (36.6 C)  SpO2: 90%    Gen: Pleasant, thin, in no distress,  normal affect  ENT: No lesions,  mouth clear,  oropharynx clear, no postnasal drip  Neck: No JVD, no stridor  Lungs: No use of accessory muscles, clear without rales or rhonchi  Cardiovascular: RRR, heart sounds normal, no murmur or gallops, no peripheral edema  Musculoskeletal: No deformities, no cyanosis or clubbing  Neuro: alert, non focal  Skin: Warm, no lesions or rash     Assessment & Plan:  Mild intermittent asthma Keep your albuterol available to use 2 puffs if needed for shortness of breath, chest tightness, wheezing. Keep track of your albuterol use and symptoms.  If they are increasing please call so we can consider whether your medications need to be adjusted. Flu shot and COVID-19 vaccine are both up-to-date Follow Dr. Lamonte Sakai in 1 year or sooner if you have any problems.  Allergic rhinitis Please continue your loratadine 10 mg  once daily.  Cynthia Apo, MD, PhD 06/11/2021, 10:36 AM [facility] [phone] or if no answer [phone]

## 2021-06-11 NOTE — Assessment & Plan Note (Signed)
Please continue your loratadine 10 mg once daily.

## 2021-08-08 DIAGNOSIS — F419 Anxiety disorder, unspecified: Secondary | ICD-10-CM | POA: Diagnosis not present

## 2021-08-08 DIAGNOSIS — Z1211 Encounter for screening for malignant neoplasm of colon: Secondary | ICD-10-CM | POA: Diagnosis not present

## 2021-08-08 DIAGNOSIS — Z01419 Encounter for gynecological examination (general) (routine) without abnormal findings: Secondary | ICD-10-CM | POA: Diagnosis not present

## 2021-08-08 DIAGNOSIS — Z1231 Encounter for screening mammogram for malignant neoplasm of breast: Secondary | ICD-10-CM | POA: Diagnosis not present

## 2021-08-21 DIAGNOSIS — L570 Actinic keratosis: Secondary | ICD-10-CM | POA: Diagnosis not present

## 2021-08-21 DIAGNOSIS — L821 Other seborrheic keratosis: Secondary | ICD-10-CM | POA: Diagnosis not present

## 2021-08-21 DIAGNOSIS — D1801 Hemangioma of skin and subcutaneous tissue: Secondary | ICD-10-CM | POA: Diagnosis not present

## 2021-08-21 DIAGNOSIS — L812 Freckles: Secondary | ICD-10-CM | POA: Diagnosis not present

## 2021-08-29 DIAGNOSIS — Z1211 Encounter for screening for malignant neoplasm of colon: Secondary | ICD-10-CM | POA: Diagnosis not present

## 2021-09-11 LAB — COLOGUARD: COLOGUARD: NEGATIVE

## 2021-12-04 DIAGNOSIS — Z961 Presence of intraocular lens: Secondary | ICD-10-CM | POA: Diagnosis not present

## 2021-12-04 DIAGNOSIS — H43311 Vitreous membranes and strands, right eye: Secondary | ICD-10-CM | POA: Diagnosis not present

## 2021-12-06 DIAGNOSIS — I2510 Atherosclerotic heart disease of native coronary artery without angina pectoris: Secondary | ICD-10-CM | POA: Diagnosis not present

## 2021-12-06 DIAGNOSIS — M810 Age-related osteoporosis without current pathological fracture: Secondary | ICD-10-CM | POA: Diagnosis not present

## 2021-12-16 DIAGNOSIS — M810 Age-related osteoporosis without current pathological fracture: Secondary | ICD-10-CM | POA: Diagnosis not present

## 2022-01-15 DIAGNOSIS — H43311 Vitreous membranes and strands, right eye: Secondary | ICD-10-CM | POA: Diagnosis not present

## 2022-01-15 DIAGNOSIS — Z961 Presence of intraocular lens: Secondary | ICD-10-CM | POA: Diagnosis not present

## 2022-01-20 ENCOUNTER — Other Ambulatory Visit: Payer: Self-pay | Admitting: Physician Assistant

## 2022-01-20 DIAGNOSIS — I2510 Atherosclerotic heart disease of native coronary artery without angina pectoris: Secondary | ICD-10-CM

## 2022-01-30 DIAGNOSIS — Z0000 Encounter for general adult medical examination without abnormal findings: Secondary | ICD-10-CM | POA: Diagnosis not present

## 2022-01-30 DIAGNOSIS — G43109 Migraine with aura, not intractable, without status migrainosus: Secondary | ICD-10-CM | POA: Diagnosis not present

## 2022-01-30 DIAGNOSIS — M810 Age-related osteoporosis without current pathological fracture: Secondary | ICD-10-CM | POA: Diagnosis not present

## 2022-01-30 DIAGNOSIS — I2510 Atherosclerotic heart disease of native coronary artery without angina pectoris: Secondary | ICD-10-CM | POA: Diagnosis not present

## 2022-03-19 ENCOUNTER — Ambulatory Visit: Payer: Medicare Other | Attending: Cardiovascular Disease | Admitting: Cardiovascular Disease

## 2022-03-19 ENCOUNTER — Encounter: Payer: Self-pay | Admitting: Cardiovascular Disease

## 2022-03-19 VITALS — BP 124/70 | HR 73 | Ht 62.0 in | Wt 110.8 lb

## 2022-03-19 DIAGNOSIS — I2510 Atherosclerotic heart disease of native coronary artery without angina pectoris: Secondary | ICD-10-CM

## 2022-03-19 DIAGNOSIS — I471 Supraventricular tachycardia, unspecified: Secondary | ICD-10-CM | POA: Diagnosis not present

## 2022-03-19 MED ORDER — NITROGLYCERIN 0.4 MG SL SUBL: SUBLINGUAL_TABLET | SUBLINGUAL | 5 refills | Status: DC

## 2022-03-19 NOTE — Progress Notes (Signed)
Chief Complaint  Patient presents with   Follow-up    CAD   History of Present Illness: 76 yo female with history of CAD, COPD and GERD here today for cardiac follow up. She had a NSTEMI in 2012 and was found to have severe disease in the mid LAD and mid RCA. A drug eluting stent was placed in the mid LAD and a drug eluting stent was placed in the mid RCA. Echo February 2018 with normal LV size and function. She called our office with c/o palpitations 10/07/16. Cardiac monitor June 2018 with sinus rhythm, rare PVCs, frequent PACs with several 6 beat runs of SVT. Nuclear stress test in January 2019 with no ischemia. This was arranged given c/o of dyspnea and fatigue. Her fatigue did not resolve off of Crestor. She is now on Lipitor. She followed up in the pulmonary office and is felt to have COPD. Cardiac cath August 2019 with patent LAD and RCA stents, severe stenosis in a small caliber obtuse marginal branch that was too small for PCI. She did not tolerate Toprol or Imdur.   He is here today for follow up. The patient denies any chest pain, dyspnea, palpitations, lower extremity edema, orthopnea, PND, dizziness, near syncope or syncope.   Primary Care Physician: Orpah Melter, MD  Past Medical History:  Diagnosis Date   Allergy    Arthritis    COPD (chronic obstructive pulmonary disease) ([place])    Coronary artery disease August 2012   Drug eluting stent mid LAD, drug eluting stent mid RCA   Depression    Emphysema    Heart attack [facility]) August 2012   Migraine    Osteoporosis    TIA (transient ischemic attack) 1995    Past Surgical History:  Procedure Laterality Date   APPENDECTOMY  01/1956   CATARACT EXTRACTION     CORONARY ANGIOPLASTY WITH STENT PLACEMENT  2012   FOOT SURGERY Bilateral    HAND SURGERY Left    LEFT HEART CATH AND CORONARY ANGIOGRAPHY N/A 12/18/2017   Procedure: LEFT HEART CATH AND CORONARY ANGIOGRAPHY;  Surgeon: Burnell Blanks, MD;  Location: [facility];  Service: Cardiovascular;  Laterality: N/A;   NASAL SEPTUM SURGERY     SHOULDER SURGERY Left    frozen shoulder   THORACOTOMY     TONSILLECTOMY      Current Outpatient Medications  Medication Sig Dispense Refill   albuterol (VENTOLIN HFA) 108 (90 Base) MCG/ACT inhaler Inhale 2 puffs into the lungs every 6 (six) hours as needed for wheezing or shortness of breath. 8 g 6   atorvastatin (LIPITOR) 40 MG tablet TAKE 1 TABLET BY MOUTH EVERY DAY 90 tablet 1   Cholecalciferol (VITAMIN D) 2000 UNITS CAPS Take 2,000 Units by mouth daily.     clopidogrel (PLAVIX) 75 MG tablet TAKE 1 TABLET BY MOUTH DAILY 90 tablet 0   denosumab (PROLIA) 60 MG/ML SOLN injection Inject 60 mg into the skin every 6 (six) months. Administer in upper arm, thigh, or abdomen     loratadine (CLARITIN) 10 MG tablet Take 10 mg by mouth daily.     sertraline (ZOLOFT) 100 MG tablet Take 50 mg by mouth daily.      topiramate (TOPAMAX) 100 MG tablet Take 100 mg by mouth daily.     tretinoin (RETIN-A) 0.05 % cream Apply 1 application topically as needed.     zolpidem (AMBIEN) 10 MG tablet Take 10 mg by mouth at bedtime as needed for  sleep.      nitroGLYCERIN (NITROSTAT) 0.4 MG SL tablet PLACE 1 TABLET (0.4 MG TOTAL) UNDER THE TONGUE EVERY 5 (FIVE) MINUTES AS NEEDED FOR CHEST PAIN. 25 tablet 5   Current Facility-Administered Medications  Medication Dose Route Frequency Provider Last Rate Last Admin   0.9 %  sodium chloride infusion  500 mL Intravenous Continuous Irene Shipper, MD        Allergies  Allergen Reactions   Naproxen Hives   Parabens Other (See Comments)    PT IS NOT SURE OF REACTION   Estradiol Hives   Tramadol Hcl Nausea And Vomiting    Social History   Socioeconomic History   Marital status: Married    Spouse name: Not on file   Number of children: 2   Years of education: Not on file   Highest education level: Not on file  Occupational History   Occupation: retired  Tobacco Use    Smoking status: Former    Packs/day: 1.00    Years: 10.00    Total pack years: 10.00    Types: Cigarettes    Quit date: 05/06/1971    Years since quitting: 50.9   Smokeless tobacco: Never  Vaping Use   Vaping Use: Never used  Substance and Sexual Activity   Alcohol use: No   Drug use: No   Sexual activity: Not on file  Other Topics Concern   Not on file  Social History Narrative   Not on file   Social Determinants of Health   Financial Resource Strain: Not on file  Food Insecurity: Not on file  Transportation Needs: Not on file  Physical Activity: Not on file  Stress: Not on file  Social Connections: Not on file  Intimate Partner Violence: Not on file    Family History  Problem Relation Age of Onset   Heart disease Paternal Grandmother    Heart disease Paternal Grandfather    Rheum arthritis Maternal Grandmother    Diabetes Maternal Grandmother    Rheum arthritis Maternal Grandfather    Lung cancer Maternal Uncle    Diabetes Mother    Diabetes Maternal Aunt     Review of Systems:  As stated in the HPI and otherwise negative.   BP 124/70   Pulse 73   Ht '5\' 2"'$  (1.575 m)   Wt 110 lb 12.8 oz (50.3 kg)   SpO2 (!) 88%   BMI 20.27 kg/m   Physical Examination: General: Well developed, well nourished, NAD  HEENT: OP clear, mucus membranes moist  SKIN: warm, dry. No rashes. Neuro: No focal deficits  Musculoskeletal: Muscle strength 5/5 all ext  Psychiatric: Mood and affect normal  Neck: No JVD, no carotid bruits, no thyromegaly, no lymphadenopathy.  Lungs:Clear bilaterally, no wheezes, rhonci, crackles Cardiovascular: Regular rate and rhythm. No murmurs, gallops or rubs. Abdomen:Soft. Bowel sounds present. Non-tender.  Extremities: No lower extremity edema. Pulses are 2 + in the bilateral DP/PT.  Echo February 2018: Left ventricle: The cavity size was normal. Wall thickness was   normal. Systolic function was normal. The estimated ejection   fraction was in the  range of 55% to 60%. Doppler parameters are   consistent with abnormal left ventricular relaxation (grade 1   diastolic dysfunction).  EKG:  EKG is ordered today. The ekg ordered today demonstrates Sinus, PACs,   Recent Labs: No results found for requested labs within last 365 days.   Lipid Panel Primary care:    Wt Readings from Last 3 Encounters:  03/19/22 110 lb 12.8 oz (50.3 kg)  06/11/21 113 lb 3.2 oz (51.3 kg)  03/19/21 113 lb 6.4 oz (51.4 kg)    Assessment and Plan:   1. CAD without angina:  No chest pain. Continue Plavix and statin. She did not tolerate Toprol due to fatigue. Lipids well controlled per pt in primary care. LDL 65 in August 2023. .    2. Premature atrial contractions/SVT: No palpitations.   Labs/ tests ordered today include:   Orders Placed This Encounter  Procedures   EKG 12-Lead   Disposition:   F/U with me in one year  Signed, Lauree Chandler, MD 03/19/2022 4:32 PM    [facility] [address], [place], [place]  [address] Phone: [phone]; Fax: [phone]

## 2022-03-19 NOTE — Patient Instructions (Signed)
Medication Instructions:  No changes *If you need a refill on your cardiac medications before your next appointment, please call your pharmacy*   Lab Work: none If you have labs (blood work) drawn today and your tests are completely normal, you will receive your results only by: [place] (if you have MyChart) OR A paper copy in the mail If you have any lab test that is abnormal or we need to change your treatment, we will call you to review the results.   Testing/Procedures: none   Follow-Up: At [facility], you and your health needs are our priority.  As part of our continuing mission to provide you with exceptional heart care, we have created designated Provider Care Teams.  These Care Teams include your primary Cardiologist (physician) and Advanced Practice Providers (APPs -  Physician Assistants and Nurse Practitioners) who all work together to provide you with the care you need, when you need it.   Your next appointment:   12 month(s)  The format for your next appointment:   In Person  Provider:   Lauree Chandler, MD     Other Instructions   Important Information About Sugar

## 2022-03-25 ENCOUNTER — Telehealth: Payer: Self-pay | Admitting: Cardiovascular Disease

## 2022-03-25 NOTE — Telephone Encounter (Signed)
Called and spoke with pharmacist at [facility].  Needed to add clarification for ntg max up to 3 doses/15 min call 911.

## 2022-03-25 NOTE — Telephone Encounter (Signed)
Pt c/o medication issue:  1. Name of Medication: nitroGLYCERIN (NITROSTAT) 0.4 MG SL tablet   2. How are you currently taking this medication (dosage and times per day)? : PLACE 1 TABLET (0.4 MG TOTAL) UNDER THE TONGUE EVERY 5 (FIVE) MINUTES AS NEEDED FOR CHEST PAIN.   3. Are you having a reaction (difficulty breathing--STAT)?   4. What is your medication issue? Pharmacy says they need additional clarification on instructions

## 2022-03-25 NOTE — Telephone Encounter (Signed)
Pt c/o medication issue:  1. Name of Medication: nitroGLYCERIN (NITROSTAT) 0.4 MG SL tablet   2. How are you currently taking this medication (dosage and times per day)?   3. Are you having a reaction (difficulty breathing--STAT)?  4. What is your medication issue? Prescription was sent to   [facility] [place], [place]  Patient states they need something clarified before they will fill it for her, they can be called [phone].

## 2022-03-31 NOTE — Telephone Encounter (Signed)
Addressed in different telephone encounter on 03/25/22.

## 2022-04-21 DIAGNOSIS — M13849 Other specified arthritis, unspecified hand: Secondary | ICD-10-CM | POA: Diagnosis not present

## 2022-04-21 DIAGNOSIS — M13841 Other specified arthritis, right hand: Secondary | ICD-10-CM | POA: Diagnosis not present

## 2022-04-21 DIAGNOSIS — M79641 Pain in right hand: Secondary | ICD-10-CM | POA: Diagnosis not present

## 2022-04-21 DIAGNOSIS — M79642 Pain in left hand: Secondary | ICD-10-CM | POA: Diagnosis not present

## 2022-06-09 ENCOUNTER — Ambulatory Visit: Payer: Medicare Other | Admitting: Cardiovascular Disease

## 2022-06-09 DIAGNOSIS — H52223 Regular astigmatism, bilateral: Secondary | ICD-10-CM | POA: Diagnosis not present

## 2022-06-19 DIAGNOSIS — I2510 Atherosclerotic heart disease of native coronary artery without angina pectoris: Secondary | ICD-10-CM | POA: Diagnosis not present

## 2022-06-19 DIAGNOSIS — M810 Age-related osteoporosis without current pathological fracture: Secondary | ICD-10-CM | POA: Diagnosis not present

## 2022-06-24 DIAGNOSIS — M810 Age-related osteoporosis without current pathological fracture: Secondary | ICD-10-CM | POA: Diagnosis not present

## 2022-07-02 ENCOUNTER — Ambulatory Visit: Payer: Medicare Other

## 2022-07-02 ENCOUNTER — Encounter: Payer: Self-pay | Admitting: Emergency Medicine

## 2022-07-02 ENCOUNTER — Ambulatory Visit: Payer: Medicare Other | Admitting: Emergency Medicine

## 2022-07-02 VITALS — BP 120/68 | HR 101 | Temp 98.7°F | Ht 62.5 in | Wt 111.0 lb

## 2022-07-02 DIAGNOSIS — R0609 Other forms of dyspnea: Secondary | ICD-10-CM | POA: Diagnosis not present

## 2022-07-02 DIAGNOSIS — J301 Allergic rhinitis due to pollen: Secondary | ICD-10-CM | POA: Diagnosis not present

## 2022-07-02 DIAGNOSIS — J4520 Mild intermittent asthma, uncomplicated: Secondary | ICD-10-CM

## 2022-07-02 DIAGNOSIS — R0902 Hypoxemia: Secondary | ICD-10-CM

## 2022-07-02 DIAGNOSIS — R053 Chronic cough: Secondary | ICD-10-CM

## 2022-07-02 NOTE — Addendum Note (Signed)
Addended by: Collene Gobble on: 07/02/2022 01:46 PM   Modules accepted: Level of Service

## 2022-07-02 NOTE — Addendum Note (Signed)
Addended by: Gavin Potters R on: 07/02/2022 11:32 AM   Modules accepted: Orders

## 2022-07-02 NOTE — Assessment & Plan Note (Addendum)
She has had increased cough since last visit, more active over the last 6 months.  She denies any significant increase in rhinitis, denies any GERD.  She has been treated more aggressively for both in the past, has also been on Neurontin in the past.  For now she wants to stay on loratadine which I think is reasonable.  I do not think her cough reflects bronchospasm, sounds more upper airway.  It has not been relieved by albuterol

## 2022-07-02 NOTE — Patient Instructions (Signed)
All available to use 2 puffs when needed for shortness of breath, chest tightness, wheezing. Continue your loratadine 10 mg (generic Claritin) once daily. We will perform a walking oximetry today on room air Follow with Dr. Lamonte Sakai in 12 months or sooner if you have any problems.

## 2022-07-02 NOTE — Assessment & Plan Note (Signed)
Continue loratadine 10 mg once daily.  Discussed ramping up her therapy but she minimizes her symptoms, does not see a role for nasal sprays, etc.

## 2022-07-02 NOTE — Progress Notes (Addendum)
  Subjective:    Patient ID: [name], female    DOB: 1945/09/04, 77 y.o.   MRN: [id number]  HPI Comments:   ROV 07/02/2022 --pleasant 77 year old woman with history of mild mitten asthma, chronic cough, chronic rhinitis.  Question whether she may also have exertional hypoxemia as she has seen desaturations at home.  We have not documented any saturations less than 93% that I can find in the system Today she reports that she has an increased dry cough over the last six months, no real change in her nasal congestion. She remains on loratadine. No GERD sx. She has exertional SOB w a long walk, with stairs. Sometimes relieved by her albuterol. Uses the the albuterol about 1-2x a week. She has seen some low saturations at night, not always w exertion.     Objective:   Physical Exam Vitals:   07/02/22 1036  BP: 120/68  Pulse: (!) 101  Temp: 98.7 F (37.1 C)  SpO2: 93%    Gen: Pleasant, thin, in no distress,  normal affect  ENT: No lesions,  mouth clear,  oropharynx clear, no postnasal drip  Neck: No JVD, no stridor  Lungs: No use of accessory muscles, clear without rales or rhonchi  Cardiovascular: RRR, heart sounds normal, no murmur or gallops, no peripheral edema  Musculoskeletal: No deformities, no cyanosis or clubbing  Neuro: alert, non focal  Skin: Warm, no lesions or rash     Assessment & Plan:  COUGH, CHRONIC She has had increased cough since last visit, more active over the last 6 months.  She denies any significant increase in rhinitis, denies any GERD.  She has been treated more aggressively for both in the past, has also been on Neurontin in the past.  For now she wants to stay on loratadine which I think is reasonable.  I do not think her cough reflects bronchospasm, sounds more upper airway.  It has not been relieved by albuterol  Mild intermittent asthma Cough is her main symptom although she can have exertional shortness of breath.  Sometimes relieved by  albuterol.  She has been on scheduled BD therapy before but has not benefited.  Plan to continue albuterol as needed.  Allergic rhinitis Continue loratadine 10 mg once daily.  Discussed ramping up her therapy but she minimizes her symptoms, does not see a role for nasal sprays, etc.  Dyspnea She states that she has seen desaturations at home, that she has been desaturated and other office visits with other physicians.  I have never been able to document a true desaturation.  We will repeat her walking oximetry today.  If she qualifies we will obtain oxygen, probably expand workup to evaluate for causes of hypoxemia  Hypoxemia Symmetry today confirms hypoxemia.  We will arrange for a POC for her.  I will check a VQ scan, chest x-ray, echocardiogram.  May consider repeating her pulmonary function testing depending on this evaluation.  Follow-up to review.  Time spent 41 minutes  Baltazar Apo, MD, PhD 07/02/2022, 1:45 PM [facility] [phone] or if no answer [phone]

## 2022-07-02 NOTE — Assessment & Plan Note (Signed)
Cough is her main symptom although she can have exertional shortness of breath.  Sometimes relieved by albuterol.  She has been on scheduled BD therapy before but has not benefited.  Plan to continue albuterol as needed.

## 2022-07-02 NOTE — Assessment & Plan Note (Signed)
She states that she has seen desaturations at home, that she has been desaturated and other office visits with other physicians.  I have never been able to document a true desaturation.  We will repeat her walking oximetry today.  If she qualifies we will obtain oxygen, probably expand workup to evaluate for causes of hypoxemia

## 2022-07-02 NOTE — Assessment & Plan Note (Signed)
Symmetry today confirms hypoxemia.  We will arrange for a POC for her.  I will check a VQ scan, chest x-ray, echocardiogram.  May consider repeating her pulmonary function testing depending on this evaluation.  Follow-up to review.

## 2022-07-04 ENCOUNTER — Telehealth: Payer: Self-pay | Admitting: Emergency Medicine

## 2022-07-04 DIAGNOSIS — R0609 Other forms of dyspnea: Secondary | ICD-10-CM

## 2022-07-04 NOTE — Telephone Encounter (Signed)
Updated order for POC. Nothing further needed

## 2022-07-08 ENCOUNTER — Ambulatory Visit (HOSPITAL_COMMUNITY)
Admission: RE | Admit: 2022-07-08 | Discharge: 2022-07-08 | Disposition: A | Discharge status: Home / Self Care | Payer: Medicare Other | Source: Ambulatory Visit | Attending: Emergency Medicine | Admitting: Emergency Medicine

## 2022-07-08 DIAGNOSIS — R053 Chronic cough: Secondary | ICD-10-CM | POA: Insufficient documentation

## 2022-07-08 DIAGNOSIS — J439 Emphysema, unspecified: Secondary | ICD-10-CM | POA: Diagnosis not present

## 2022-07-08 DIAGNOSIS — R0602 Shortness of breath: Secondary | ICD-10-CM | POA: Diagnosis not present

## 2022-07-08 MED ORDER — TECHNETIUM TO 99M ALBUMIN AGGREGATED
4.1000 | Freq: Once | INTRAVENOUS | Status: AC | PRN
  Administered 2022-07-08: 4.1 via INTRAVENOUS

## 2022-07-14 ENCOUNTER — Ambulatory Visit (HOSPITAL_COMMUNITY)
Admission: RE | Admit: 2022-07-14 | Discharge: 2022-07-14 | Disposition: A | Discharge status: Home / Self Care | Payer: Medicare Other | Source: Ambulatory Visit | Attending: Emergency Medicine | Admitting: Emergency Medicine

## 2022-07-14 DIAGNOSIS — R053 Chronic cough: Secondary | ICD-10-CM | POA: Diagnosis not present

## 2022-07-14 DIAGNOSIS — R0609 Other forms of dyspnea: Secondary | ICD-10-CM | POA: Diagnosis not present

## 2022-07-14 DIAGNOSIS — I252 Old myocardial infarction: Secondary | ICD-10-CM | POA: Diagnosis not present

## 2022-07-14 LAB — ECHOCARDIOGRAM COMPLETE
Area-P 1/2: 3.99 cm2
Calc EF: 64 %
S' Lateral: 3.1 cm
Single Plane A2C EF: 65.5 %
Single Plane A4C EF: 63.5 %

## 2022-07-14 NOTE — Progress Notes (Signed)
Echocardiogram 2D Echocardiogram has been performed.  Frances Furbish 07/14/2022, 11:42 AM

## 2022-07-31 ENCOUNTER — Telehealth: Payer: Self-pay | Admitting: Emergency Medicine

## 2022-07-31 NOTE — Telephone Encounter (Signed)
Patient can you please advise on Echo results?

## 2022-07-31 NOTE — Telephone Encounter (Signed)
Went over Echo results with patient. She verbalized understanding.   Patient also stated she never received POC machine from [employer]. Advised I would call [employer] for an update.  Called Mardene Celeste from [employer] needs order to say continuous and they need sats-within the last 30 days, Mardene Celeste asked if patient has been walked on POC?   Dr. Lamonte Sakai can you advise? Also routing to [place]

## 2022-07-31 NOTE — Telephone Encounter (Signed)
Please let the patient know that her echocardiogram shows normal heart size and muscle function.  No evidence of pulmonary hypertension seen.  This is good news

## 2022-08-04 NOTE — Telephone Encounter (Signed)
Spoke with [employer] who was needing POC order to be changed to stated Portable Oxygen Concentrator. Order was edited and resent. Nothing further needed at this time.

## 2022-08-04 NOTE — Addendum Note (Signed)
Addended by: Gavin Potters R on: 08/04/2022 11:53 AM   Modules accepted: Orders

## 2022-08-04 NOTE — Telephone Encounter (Signed)
New order was sent to [employer] today - order not correct.  Order still states POC 2 liters pulse.  Order needs to say continuous with poc to be correct.  Also, SATS will need to be updated.  Can call Mardene Celeste at [phone] with questions.

## 2022-08-05 NOTE — Telephone Encounter (Signed)
Spoke with Mardene Celeste who states pt is not established for oxygen through [employer]. In order for pt to get a POC pt must first be established on continuous O2 with [employer]. Pt was walked on pulsed POC when in office and now Mardene Celeste is stating that sats can not be used because they are over 73 days old. According to Lenor Coffin notes can still be used and order just needs to be updated to state "continuous with stationary and portable oxygen unit needed"   Spoke with pt who is coming in tomorrow for OV to complete walk for updated sats.   Pt would like to send O2 order to anyone but [employer] moving forward.   Nothing further needed at this time.

## 2022-08-05 NOTE — Telephone Encounter (Signed)
Not all (in fact few) portable O2 concentrators do continuous flow O2. Are we sure that this is correct? Did we qualify her walking on continuous O2 or on pulsed flow?

## 2022-08-06 ENCOUNTER — Ambulatory Visit (INDEPENDENT_AMBULATORY_CARE_PROVIDER_SITE_OTHER): Payer: Medicare Other | Admitting: Emergency Medicine

## 2022-08-06 ENCOUNTER — Encounter: Payer: Self-pay | Admitting: Emergency Medicine

## 2022-08-06 ENCOUNTER — Ambulatory Visit: Payer: Medicare Other | Admitting: Emergency Medicine

## 2022-08-06 VITALS — BP 120/74 | HR 104 | Temp 98.0°F | Ht 62.5 in | Wt 112.4 lb

## 2022-08-06 DIAGNOSIS — J301 Allergic rhinitis due to pollen: Secondary | ICD-10-CM | POA: Diagnosis not present

## 2022-08-06 DIAGNOSIS — J4520 Mild intermittent asthma, uncomplicated: Secondary | ICD-10-CM

## 2022-08-06 DIAGNOSIS — R0902 Hypoxemia: Secondary | ICD-10-CM | POA: Diagnosis not present

## 2022-08-06 NOTE — Assessment & Plan Note (Signed)
Not on maintenance therapy.  Continue to keep albuterol available to use if needed.

## 2022-08-06 NOTE — Addendum Note (Signed)
Addended by: Gavin Potters R on: 08/06/2022 02:26 PM   Modules accepted: Orders

## 2022-08-06 NOTE — Assessment & Plan Note (Signed)
Continue current regimen

## 2022-08-06 NOTE — Assessment & Plan Note (Signed)
Newly identified hypoxemic respiratory failure that is out of proportion to her mild intermittent asthma.  Cause unclear, reassuring echocardiogram, reassuring VQ scan.  There was a mention of possible density on her chest x-ray and we will evaluate further with a high-resolution CT scan of her chest.  Oxygen was titrated to 3 L/min pulsed with exertion today.  We will get her a POC

## 2022-08-06 NOTE — Progress Notes (Signed)
  Subjective:    Patient ID: [name], female    DOB: [birth year], 77 y.o.   MRN: [id number]  HPI Comments:   ROV 07/02/2022 --pleasant 77 year old woman with history of mild mitten asthma, chronic cough, chronic rhinitis.  Question whether she may also have exertional hypoxemia as she has seen desaturations at home.  We have not documented any saturations less than 93% that I can find in the system Today she reports that she has an increased dry cough over the last six months, no real change in her nasal congestion. She remains on loratadine. No GERD sx. She has exertional SOB w a long walk, with stairs. Sometimes relieved by her albuterol. Uses the the albuterol about 1-2x a week. She has seen some low saturations at night, not always w exertion.    ROV 08/06/2022 --77 year old woman with mild intermittent asthma and chronic cough, chronic rhinitis.  She is been under evaluation for progressive dyspnea and exertional hypoxemia.  We performed an echocardiogram that did not show any evidence of abnormal LV function or pulmonary hypertension 07/14/2022.  Ventilation/perfusion scan 07/08/2022 was negative for acute or chronic thromboembolic disease.  Chest x-ray 07/08/2022 reviewed by me showed nodular density on the left posterior to the airway, no overt infiltrates, no effusions.  A CT chest was recommended.    Objective:   Physical Exam Vitals:   08/06/22 1331  BP: 120/74  Pulse: (!) 104  Temp: 98 F (36.7 C)  SpO2: 92%    Gen: Pleasant, thin, in no distress,  normal affect  ENT: No lesions,  mouth clear,  oropharynx clear, no postnasal drip  Neck: No JVD, no stridor  Lungs: No use of accessory muscles, clear without rales or rhonchi  Cardiovascular: RRR, heart sounds normal, no murmur or gallops, no peripheral edema  Musculoskeletal: No deformities, no cyanosis or clubbing  Neuro: alert, non focal  Skin: Warm, no lesions or rash     Assessment & Plan:  Hypoxemia Newly  identified hypoxemic respiratory failure that is out of proportion to her mild intermittent asthma.  Cause unclear, reassuring echocardiogram, reassuring VQ scan.  There was a mention of possible density on her chest x-ray and we will evaluate further with a high-resolution CT scan of her chest.  Oxygen was titrated to 3 L/min pulsed with exertion today.  We will get her a POC  Mild intermittent asthma Not on maintenance therapy.  Continue to keep albuterol available to use if needed.  Allergic rhinitis Continue current regimen     Baltazar Apo, MD, PhD 08/06/2022, 1:45 PM [facility] [phone] or if no answer [phone]

## 2022-08-06 NOTE — Patient Instructions (Addendum)
We reviewed your echocardiogram and ventilation/perfusion scan today. We titrated your oxygen.  You need to wear 3 L/min pulsed flow when you are up exerting yourself. Keep your albuterol available to use 2 puffs when needed for shortness of breath, chest tightness, wheezing. We will arrange for a high-resolution CT scan of the chest. Follow Dr. Lamonte Sakai next available after your CT scan so we can review the results together

## 2022-08-12 DIAGNOSIS — J4520 Mild intermittent asthma, uncomplicated: Secondary | ICD-10-CM | POA: Diagnosis not present

## 2022-08-14 DIAGNOSIS — J4520 Mild intermittent asthma, uncomplicated: Secondary | ICD-10-CM | POA: Diagnosis not present

## 2022-08-15 ENCOUNTER — Ambulatory Visit (HOSPITAL_BASED_OUTPATIENT_CLINIC_OR_DEPARTMENT_OTHER)
Admission: RE | Admit: 2022-08-15 | Discharge: 2022-08-15 | Disposition: A | Discharge status: Home / Self Care | Payer: Medicare Other | Source: Ambulatory Visit | Attending: Emergency Medicine | Admitting: Emergency Medicine

## 2022-08-15 DIAGNOSIS — K7689 Other specified diseases of liver: Secondary | ICD-10-CM | POA: Diagnosis not present

## 2022-08-15 DIAGNOSIS — J479 Bronchiectasis, uncomplicated: Secondary | ICD-10-CM | POA: Diagnosis not present

## 2022-08-15 DIAGNOSIS — R0902 Hypoxemia: Secondary | ICD-10-CM | POA: Insufficient documentation

## 2022-08-15 DIAGNOSIS — J189 Pneumonia, unspecified organism: Secondary | ICD-10-CM | POA: Diagnosis not present

## 2022-08-15 DIAGNOSIS — I2510 Atherosclerotic heart disease of native coronary artery without angina pectoris: Secondary | ICD-10-CM | POA: Insufficient documentation

## 2022-08-15 DIAGNOSIS — I700 Atherosclerosis of aorta: Secondary | ICD-10-CM | POA: Diagnosis not present

## 2022-08-21 ENCOUNTER — Encounter: Payer: Self-pay | Admitting: Emergency Medicine

## 2022-08-21 ENCOUNTER — Ambulatory Visit: Payer: Medicare Other | Admitting: Emergency Medicine

## 2022-08-21 VITALS — BP 122/76 | HR 89 | Temp 97.8°F | Ht 62.5 in | Wt 111.6 lb

## 2022-08-21 DIAGNOSIS — J4520 Mild intermittent asthma, uncomplicated: Secondary | ICD-10-CM | POA: Diagnosis not present

## 2022-08-21 DIAGNOSIS — J9611 Chronic respiratory failure with hypoxia: Secondary | ICD-10-CM | POA: Diagnosis not present

## 2022-08-21 NOTE — Assessment & Plan Note (Signed)
Mild intermittent disease, managed on albuterol as needed.  No overt symptoms or flaring currently

## 2022-08-21 NOTE — Progress Notes (Signed)
Subjective:    Patient ID: [name], female    DOB: 1946-02-01, 77 y.o.   MRN: [id number]  HPI Comments:   ROV 07/02/2022 --pleasant 77 year old woman with history of mild mitten asthma, chronic cough, chronic rhinitis.  Question whether she may also have exertional hypoxemia as she has seen desaturations at home.  We have not documented any saturations less than 93% that I can find in the system Today she reports that she has an increased dry cough over the last six months, no real change in her nasal congestion. She remains on loratadine. No GERD sx. She has exertional SOB w a long walk, with stairs. Sometimes relieved by her albuterol. Uses the the albuterol about 1-2x a week. She has seen some low saturations at night, not always w exertion.    ROV 08/06/2022 --77 year old woman with mild intermittent asthma and chronic cough, chronic rhinitis.  She is been under evaluation for progressive dyspnea and exertional hypoxemia.  We performed an echocardiogram that did not show any evidence of abnormal LV function or pulmonary hypertension 07/14/2022.  Ventilation/perfusion scan 07/08/2022 was negative for acute or chronic thromboembolic disease.  Chest x-ray 07/08/2022 reviewed by me showed nodular density on the left posterior to the airway, no overt infiltrates, no effusions.  A CT chest was recommended.  ROV 08/21/22 --[name] is 89 with history of mild intermittent asthma, chronic cough and chronic rhinitis.  She has been experiencing progressive dyspnea and exertional hypoxemia that seem to be out of a portion to her obstructive lung disease.  Echocardiogram showed normal RV size and function but the tricuspid regurgitation signal was inadequate for assessing her PA pressures.  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  She has some chronic posterior biapical bandlike scarring and some varicoid  bronchiectatic change and volume loss.    Objective:   Physical Exam Vitals:   08/21/22 1459  BP: 122/76  Pulse: 89  Temp: 97.8 F (36.6 C)  SpO2: 92%     Gen: Pleasant, thin, in no distress,  normal affect  ENT: No lesions,  mouth clear,  oropharynx clear, no postnasal drip  Neck: No JVD, no stridor  Lungs: No use of accessory muscles, clear without rales or rhonchi  Cardiovascular: RRR, heart sounds normal, no murmur or gallops, no peripheral edema  Musculoskeletal: No deformities, no cyanosis or clubbing  Neuro: alert, non focal  Skin: Warm, no lesions or rash     Assessment & Plan:  Mild intermittent asthma Mild intermittent disease, managed on albuterol as needed.  No overt symptoms or flaring currently  Chronic hypoxemic respiratory failure Etiology has been elusive, certainly out of proportion to her asthma.  Echocardiogram was a poor study to evaluate PASP.  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  I question whether this may reflect a progressively clinically relevant AV shunt.  That would certainly explain why she is hypoxic with exertion and absence of severe cardiac or pulmonary disease.  Discussed this with her today.  She will continue the oxygen at 3 L/min with exertion.  We will start the evaluation with a bubble study.  She may need pulmonary angiography going forward.  We reviewed the testing today. We will perform an echocardiogram with bubble study to evaluate for an arteriovenous shunt Continue your oxygen at 3  L/min with exertion.  Our goal is to keep your oxygen saturations > 90% Continue to use your albuterol 2 puffs up to every 4 hours if needed for shortness of breath, chest tightness, wheezing. Follow with Dr Delton Coombes next available after your bubble study so we can review that test together.      Levy Pupa, MD, PhD 08/21/2022, 3:20  PM [facility] [phone] or if no answer [phone]

## 2022-08-21 NOTE — Addendum Note (Signed)
Addended by: Dorisann Frames R on: 08/21/2022 03:24 PM   Modules accepted: Orders

## 2022-08-21 NOTE — Assessment & Plan Note (Signed)
Etiology has been elusive, certainly out of proportion to her asthma.  Echocardiogram was a poor study to evaluate PASP.  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  I question whether this may reflect a progressively clinically relevant AV shunt.  That would certainly explain why she is hypoxic with exertion and absence of severe cardiac or pulmonary disease.  Discussed this with her today.  She will continue the oxygen at 3 L/min with exertion.  We will start the evaluation with a bubble study.  She may need pulmonary angiography going forward.  We reviewed the testing today. We will perform an echocardiogram with bubble study to evaluate for an arteriovenous shunt Continue your oxygen at 3 L/min with exertion.  Our goal is to keep your oxygen saturations > 90% Continue to use your albuterol 2 puffs up to every 4 hours if needed for shortness of breath, chest tightness, wheezing. Follow with Dr Delton Coombes next available after your bubble study so we can review that test together.

## 2022-08-21 NOTE — Patient Instructions (Addendum)
We reviewed the testing today. We will perform an echocardiogram with bubble study to evaluate for an arteriovenous shunt Continue your oxygen at 3 L/min with exertion.  Our goal is to keep your oxygen saturations > 90% Continue to use your albuterol 2 puffs up to every 4 hours if needed for shortness of breath, chest tightness, wheezing. Follow with Dr Delton Coombes next available after your bubble study so we can review that test together.

## 2022-09-11 DIAGNOSIS — J4520 Mild intermittent asthma, uncomplicated: Secondary | ICD-10-CM | POA: Diagnosis not present

## 2022-09-13 DIAGNOSIS — J4520 Mild intermittent asthma, uncomplicated: Secondary | ICD-10-CM | POA: Diagnosis not present

## 2022-09-18 ENCOUNTER — Ambulatory Visit: Payer: Medicare Other | Admitting: Emergency Medicine

## 2022-09-19 ENCOUNTER — Other Ambulatory Visit: Payer: Self-pay

## 2022-09-19 DIAGNOSIS — I2510 Atherosclerotic heart disease of native coronary artery without angina pectoris: Secondary | ICD-10-CM

## 2022-09-19 MED ORDER — CLOPIDOGREL BISULFATE 75 MG PO TABS: 75.0000 mg | ORAL_TABLET | Freq: Every day | ORAL | 1 refills | Status: DC

## 2022-09-22 DIAGNOSIS — D2271 Melanocytic nevi of right lower limb, including hip: Secondary | ICD-10-CM | POA: Diagnosis not present

## 2022-09-22 DIAGNOSIS — L820 Inflamed seborrheic keratosis: Secondary | ICD-10-CM | POA: Diagnosis not present

## 2022-09-22 DIAGNOSIS — L821 Other seborrheic keratosis: Secondary | ICD-10-CM | POA: Diagnosis not present

## 2022-09-22 DIAGNOSIS — D225 Melanocytic nevi of trunk: Secondary | ICD-10-CM | POA: Diagnosis not present

## 2022-09-23 DIAGNOSIS — Z1231 Encounter for screening mammogram for malignant neoplasm of breast: Secondary | ICD-10-CM | POA: Diagnosis not present

## 2022-09-23 DIAGNOSIS — F411 Generalized anxiety disorder: Secondary | ICD-10-CM | POA: Diagnosis not present

## 2022-09-23 DIAGNOSIS — Z780 Asymptomatic menopausal state: Secondary | ICD-10-CM | POA: Diagnosis not present

## 2022-09-23 DIAGNOSIS — Z01419 Encounter for gynecological examination (general) (routine) without abnormal findings: Secondary | ICD-10-CM | POA: Diagnosis not present

## 2022-09-24 ENCOUNTER — Ambulatory Visit (HOSPITAL_COMMUNITY)
Admission: RE | Admit: 2022-09-24 | Discharge: 2022-09-24 | Disposition: A | Discharge status: Home / Self Care | Payer: Medicare Other | Source: Ambulatory Visit | Attending: Emergency Medicine | Admitting: Emergency Medicine

## 2022-09-24 DIAGNOSIS — J449 Chronic obstructive pulmonary disease, unspecified: Secondary | ICD-10-CM | POA: Diagnosis not present

## 2022-09-24 DIAGNOSIS — Z8673 Personal history of transient ischemic attack (TIA), and cerebral infarction without residual deficits: Secondary | ICD-10-CM | POA: Diagnosis not present

## 2022-09-24 DIAGNOSIS — I252 Old myocardial infarction: Secondary | ICD-10-CM | POA: Diagnosis not present

## 2022-09-24 DIAGNOSIS — R0609 Other forms of dyspnea: Secondary | ICD-10-CM

## 2022-09-24 DIAGNOSIS — R0600 Dyspnea, unspecified: Secondary | ICD-10-CM | POA: Diagnosis not present

## 2022-09-24 DIAGNOSIS — J9611 Chronic respiratory failure with hypoxia: Secondary | ICD-10-CM

## 2022-09-24 DIAGNOSIS — I2510 Atherosclerotic heart disease of native coronary artery without angina pectoris: Secondary | ICD-10-CM | POA: Diagnosis not present

## 2022-09-24 LAB — ECHOCARDIOGRAM COMPLETE BUBBLE STUDY
Area-P 1/2: 3.6 cm2
Calc EF: 56.9 %
S' Lateral: 2.7 cm
Single Plane A2C EF: 57.6 %
Single Plane A4C EF: 56.9 %

## 2022-09-24 NOTE — Progress Notes (Signed)
Echocardiogram 2D Echocardiogram has been performed.  Augustine Radar 09/24/2022, 4:14 PM

## 2022-10-01 ENCOUNTER — Telehealth: Payer: Self-pay | Admitting: Cardiovascular Disease

## 2022-10-01 ENCOUNTER — Encounter: Payer: Self-pay | Admitting: Emergency Medicine

## 2022-10-01 ENCOUNTER — Ambulatory Visit (INDEPENDENT_AMBULATORY_CARE_PROVIDER_SITE_OTHER): Payer: Medicare Other | Admitting: Emergency Medicine

## 2022-10-01 VITALS — BP 122/72 | HR 78 | Temp 98.7°F | Ht 62.5 in | Wt 112.2 lb

## 2022-10-01 DIAGNOSIS — Q211 Atrial septal defect, unspecified: Secondary | ICD-10-CM | POA: Diagnosis not present

## 2022-10-01 DIAGNOSIS — J4520 Mild intermittent asthma, uncomplicated: Secondary | ICD-10-CM | POA: Diagnosis not present

## 2022-10-01 DIAGNOSIS — J9611 Chronic respiratory failure with hypoxia: Secondary | ICD-10-CM | POA: Diagnosis not present

## 2022-10-01 DIAGNOSIS — J301 Allergic rhinitis due to pollen: Secondary | ICD-10-CM | POA: Diagnosis not present

## 2022-10-01 DIAGNOSIS — R0902 Hypoxemia: Secondary | ICD-10-CM

## 2022-10-01 MED ORDER — ALBUTEROL SULFATE HFA 108 (90 BASE) MCG/ACT IN AERS: 2.0000 | INHALATION_SPRAY | Freq: Four times a day (QID) | RESPIRATORY_TRACT | 6 refills | Status: AC | PRN

## 2022-10-01 NOTE — Assessment & Plan Note (Signed)
Continue current regimen

## 2022-10-01 NOTE — Telephone Encounter (Signed)
[facility] is calling to get an ECHO TEE scheduled for patient. They state any location will be fine, but there is a referral entered.  Patient can be called directly to schedule, but if any questions, Cordelia Pen from [facility] says you can reach out to her.

## 2022-10-01 NOTE — Progress Notes (Signed)
Subjective:    Patient ID: [name], female    DOB: 04/13/1946, 77 y.o.   MRN: [id number]  HPI Comments:   ROV 07/02/2022 --pleasant 77 year old woman with history of mild mitten asthma, chronic cough, chronic rhinitis.  Question whether she may also have exertional hypoxemia as she has seen desaturations at home.  We have not documented any saturations less than 93% that I can find in the system Today she reports that she has an increased dry cough over the last six months, no real change in her nasal congestion. She remains on loratadine. No GERD sx. She has exertional SOB w a long walk, with stairs. Sometimes relieved by her albuterol. Uses the the albuterol about 1-2x a week. She has seen some low saturations at night, not always w exertion.    ROV 08/06/2022 --77 year old woman with mild intermittent asthma and chronic cough, chronic rhinitis.  She is been under evaluation for progressive dyspnea and exertional hypoxemia.  We performed an echocardiogram that did not show any evidence of abnormal LV function or pulmonary hypertension 07/14/2022.  Ventilation/perfusion scan 07/08/2022 was negative for acute or chronic thromboembolic disease.  Chest x-ray 07/08/2022 reviewed by me showed nodular density on the left posterior to the airway, no overt infiltrates, no effusions.  A CT chest was recommended.  ROV 08/21/22 --[name] is 60 with history of mild intermittent asthma, chronic cough and chronic rhinitis.  She has been experiencing progressive dyspnea and exertional hypoxemia that seem to be out of a portion to her obstructive lung disease.  Echocardiogram showed normal RV size and function but the tricuspid regurgitation signal was inadequate for assessing her PA pressures.  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  She has some chronic posterior biapical bandlike scarring and some varicoid  bronchiectatic change and volume loss.  ROV 10/01/22 --follow-up visit for 77 year old woman with mild intermittent asthma, rhinitis and chronic cough.  We have been evaluating her most recently for progressive exertional dyspnea and exertional hypoxemia that have been out of proportion to her asthma.  Initial echocardiogram showed normal RV size and function.  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  This prompted me to obtain an echo bubble study which was done on 09/24/2022  Echocardiogram with bubble study 09/24/2022 report reviewed by me showed a positive saline study with agitated bubbles evident at 3-6 cardiac cycles.  Consider intra-atrial shunting, ASD.    Objective:   Physical Exam Vitals:   10/01/22 1431  BP: 122/72  Pulse: 78  Temp: 98.7 F (37.1 C)  SpO2: 92%     Gen: Pleasant, thin, in no distress,  normal affect  ENT: No lesions,  mouth clear,  oropharynx clear, no postnasal drip  Neck: No JVD, no stridor  Lungs: No use of accessory muscles, clear without rales or rhonchi  Cardiovascular: RRR, heart sounds normal, no murmur or gallops, no peripheral edema  Musculoskeletal: No deformities, no cyanosis or clubbing  Neuro: alert, non focal  Skin: Warm, no lesions or rash     Assessment & Plan:  Chronic hypoxemic respiratory failure (HCC) Evaluation has been consistent with shunt and this was confirmed on her bubble study.  Initially I thought this may be due to pulmonary vascular AVM but the bubble study would have been more consistent with an intracardiac shunt.  We discussed this  today and we will plan to order a TEE.  I will let Dr. Clifton James know about the results.  Mild intermittent asthma Continue current regimen  Allergic rhinitis Continue current regimen       Levy Pupa, MD, PhD 10/01/2022, 4:59 PM [facility] [phone] or if no answer [phone]

## 2022-10-01 NOTE — Assessment & Plan Note (Signed)
Evaluation has been consistent with shunt and this was confirmed on her bubble study.  Initially I thought this may be due to pulmonary vascular AVM but the bubble study would have been more consistent with an intracardiac shunt.  We discussed this today and we will plan to order a TEE.  I will let Dr. Clifton James know about the results.

## 2022-10-01 NOTE — Patient Instructions (Addendum)
We reviewed your echocardiogram and bubble study today. We will arrange for a transesophageal echocardiogram and will discuss your case with Dr. Clifton James with cardiology. Please continue your other medications as you have been taking them Follow-up with Dr. Delton Coombes in about 1 month to review your testing. We will also arrange for you to follow-up with Dr. Clifton James

## 2022-10-12 DIAGNOSIS — J4520 Mild intermittent asthma, uncomplicated: Secondary | ICD-10-CM | POA: Diagnosis not present

## 2022-10-13 NOTE — Telephone Encounter (Signed)
Pt calling to get her TEE scheduled

## 2022-10-14 DIAGNOSIS — J4520 Mild intermittent asthma, uncomplicated: Secondary | ICD-10-CM | POA: Diagnosis not present

## 2022-10-14 NOTE — Telephone Encounter (Signed)
Reviewed note from Dr. Delton Coombes on 10/01/22:   Assessment & Plan:  Chronic hypoxemic respiratory failure (HCC) Evaluation has been consistent with shunt and this was confirmed on her bubble study.  Initially I thought this may be due to pulmonary vascular AVM but the bubble study would have been more consistent with an intracardiac shunt.  We discussed this today and we will plan to order a TEE.  I will let Dr. Clifton James know about the results.  _____________________________________________________________  Called patient.  Scheduled her for visit with APP 10/20/22 to update H&P for TEE.    Scheduled TEE with Dr. Bjorn Pippin on Wed 10/22/22 at 12:30 pm. Pt aware she will receive TEE instructions at the visit w Dayna on Monday.

## 2022-10-16 ENCOUNTER — Telehealth: Payer: Self-pay | Admitting: Cardiovascular Disease

## 2022-10-16 NOTE — Telephone Encounter (Signed)
  Pt would like to know if she need to hold her blood thinner before her TEE. She doesn't want to wait until her appt with Ronie Spies. She wants to know now

## 2022-10-16 NOTE — Telephone Encounter (Signed)
Spoke to the patient, she is schedule for an TEE on 10/22/22 and will like to know if she hold her blood thinner. Pt does have an appointment with Ronie Spies, PA-C on 6/17, however, she did not want to wait to ask the question. Will forward to APP for advise.

## 2022-10-16 NOTE — Telephone Encounter (Signed)
I have not met patient yet but we do not generally hold blood thinners for TEE. Recommend to discuss at OV. Thank you!

## 2022-10-16 NOTE — H&P (View-Only) (Signed)
 Cardiology Office Note    Date:  10/20/2022   ID:  [name], DOB 04/20/1946, MRN [id number]  PCP:  Meyers, Stephen, MD  Cardiologist:  Christopher McAlhany, MD  Electrophysiologist:  None   Chief Complaint: discuss TEE  History of Present Illness:   [name] is a 77 y.o. female with history of CAD with NSTEMI s/p DES to LAD and RCA in 2012, palpitations (PACs, PVCs, SVT), asthma, GERD, remote TIA, chronic hypoxemic respiratory failure on 3L home O2 with exertion with possible intracardiac shunt here today for cardiac follow up.  She had a NSTEMI in 2012 and had PCI as above. Cardiac monitor 2018 for palpitations showed sinus rhythm, rare PVCs, frequent PACs with several 6 beat runs of SVT. Nuclear stress test for dyspnea and fatigue in January 2019 showed  no ischemia. Her fatigue did not resolve off of Crestor; she is now on Lipitor. Last cath August 2019 showed patent LAD and RCA stents, severe stenosis in a small caliber obtuse marginal branch that was too small for PCI. She did not tolerate Toprol or Imdur. She has been maintained on Plavix monotherapy without ASA. More recently she's been following with Dr. Byrum for chronic hypoxemic respiratory failure out of proportion to her asthma. 2D echo 07/2022 showed EF 60-65%, no significant pulmonary HTN reported. VQ 07/2022 was negative for thromboembolic disease. High res CT 08/15/22 showed question of large pumonary AVM and scarring with bronchiectasis as well as coronary/aortic atherosclerosis. Echo with bubble study 09/24/22 showed EF 60-65%, G1DD, normal RV, aortic sclerosis without stenosis, and markedly positive saline microcavitation study; suggest TEE to RO ASD. Per notes, "Evaluation has been consistent with shunt and this was confirmed on her bubble study. Initially I thought this may be due to pulmonary vascular AVM but the bubble study would have been more consistent with an intracardiac shunt. " Dr. Byrum recommended TEE  therefore she is here today to discuss. Aside from chronic DOE she denies any new cardiac symptoms. No chest pain. She does not feel that palpitations have been a recent issue. We discussed TEE at length today.  Labwork independently reviewed: KPN 06/2022 ALT 14, Mg 2.0, K 3.7, Cr 0.830 KPN 12/2021 LDL 65, trig 97 01/2019 CMET wnl 2019 CBC wnl   Past History   Past Medical History:  Diagnosis Date   Allergy    Arthritis    COPD (chronic obstructive pulmonary disease) (HCC)    Coronary artery disease August 2012   Drug eluting stent mid LAD, drug eluting stent mid RCA   Depression    Emphysema    Heart attack (HCC) August 2012   Migraine    Osteoporosis    TIA (transient ischemic attack) 1995    Past Surgical History:  Procedure Laterality Date   APPENDECTOMY  01/1956   CATARACT EXTRACTION     CORONARY ANGIOPLASTY WITH STENT PLACEMENT  2012   FOOT SURGERY Bilateral    HAND SURGERY Left    LEFT HEART CATH AND CORONARY ANGIOGRAPHY N/A 12/18/2017   Procedure: LEFT HEART CATH AND CORONARY ANGIOGRAPHY;  Surgeon: McAlhany, Christopher D, MD;  Location: [facility];  Service: Cardiovascular;  Laterality: N/A;   NASAL SEPTUM SURGERY     SHOULDER SURGERY Left    frozen shoulder   THORACOTOMY     TONSILLECTOMY      Current Medications: Current Meds  Medication Sig   acetaminophen (TYLENOL) 500 MG tablet Take 500-1,000 mg by mouth every 6 (six) hours   as needed (headache.).   albuterol (VENTOLIN HFA) 108 (90 Base) MCG/ACT inhaler Inhale 2 puffs into the lungs every 6 (six) hours as needed for wheezing or shortness of breath.   atorvastatin (LIPITOR) 40 MG tablet TAKE 1 TABLET BY MOUTH EVERY DAY (Patient taking differently: Take 40 mg by mouth every evening.)   Cholecalciferol (VITAMIN D) 2000 UNITS CAPS Take 4,000 Units by mouth in the morning.   clopidogrel (PLAVIX) 75 MG tablet Take 1 tablet (75 mg total) by mouth daily.   denosumab (PROLIA) 60 MG/ML SOLN injection Inject 60  mg into the skin every 6 (six) months. Administer in upper arm, thigh, or abdomen   loratadine (CLARITIN) 10 MG tablet Take 10 mg by mouth in the morning.   nitroGLYCERIN (NITROSTAT) 0.4 MG SL tablet PLACE 1 TABLET (0.4 MG TOTAL) UNDER THE TONGUE EVERY 5 (FIVE) MINUTES AS NEEDED FOR CHEST PAIN.   sertraline (ZOLOFT) 100 MG tablet Take 50 mg by mouth in the morning.   topiramate (TOPAMAX) 100 MG tablet Take 100 mg by mouth every evening.   zolpidem (AMBIEN) 10 MG tablet Take 10 mg by mouth at bedtime as needed for sleep.      Allergies:   Naproxen, Parabens, Estradiol, and Tramadol hcl   Social History   Socioeconomic History   Marital status: Married    Spouse name: Not on file   Number of children: 2   Years of education: Not on file   Highest education level: Not on file  Occupational History   Occupation: retired  Tobacco Use   Smoking status: Former    Packs/day: 1.00    Years: 10.00    Additional pack years: 0.00    Total pack years: 10.00    Types: Cigarettes    Quit date: 05/06/1971    Years since quitting: 51.4   Smokeless tobacco: Never  Vaping Use   Vaping Use: Never used  Substance and Sexual Activity   Alcohol use: No   Drug use: No   Sexual activity: Not on file  Other Topics Concern   Not on file  Social History Narrative   Not on file   Social Determinants of Health   Financial Resource Strain: Not on file  Food Insecurity: Not on file  Transportation Needs: Not on file  Physical Activity: Not on file  Stress: Not on file  Social Connections: Not on file     Family History:  The patient's family history includes Diabetes in her maternal aunt, maternal grandmother, and mother; Heart disease in her paternal grandfather and paternal grandmother; Lung cancer in her maternal uncle; Rheum arthritis in her maternal grandfather and maternal grandmother.  ROS:   Please see the history of present illness.  All other systems are reviewed and otherwise  negative.    EKG(s)/Additional Testing   EKG:  EKG is ordered today, personally reviewed, demonstrating NSR 79bpm, occasional PACs, nonspecific STTW changes similar to prior  CV Studies: Cardiac studies reviewed are outlined and summarized above. Otherwise please see EMR for full report.  Recent Labs: No results found for requested labs within last 365 days.  Recent Lipid Panel    Component Value Date/Time   CHOL 135 07/29/2017 0942   TRIG 90 07/29/2017 0942   HDL 67 07/29/2017 0942   CHOLHDL 2.0 07/29/2017 0942   CHOLHDL 2 01/23/2011 1042   VLDL 10.6 01/23/2011 1042   LDLCALC 50 07/29/2017 0942    PHYSICAL EXAM:    VS:  BP 118/74     Pulse 79   Ht 5' 2.5" (1.588 m)   Wt 112 lb (50.8 kg)   SpO2 92%   BMI 20.16 kg/m   BMI: Body mass index is 20.16 kg/m.  GEN: Well nourished, well developed female in no acute distress HEENT: normocephalic, atraumatic Neck: no JVD, carotid bruits, or masses Cardiac: RRR; no murmurs, rubs, or gallops, no edema  Respiratory:  clear to auscultation bilaterally, normal work of breathing GI: soft, nontender, nondistended, + BS MS: no deformity or atrophy Skin: warm and dry, no rash Neuro:  Alert and Oriented x 3, Strength and sensation are intact, follows commands Psych: euthymic mood, full affect  Wt Readings from Last 3 Encounters:  10/20/22 112 lb (50.8 kg)  10/01/22 112 lb 3.2 oz (50.9 kg)  08/21/22 111 lb 9.6 oz (50.6 kg)     ASSESSMENT & PLAN:   1. Chronic hypoxemic respiratory failure (3L home O2 with exertion, no O2 at rest) with possible intracardiac shunt - question of ASD raised 2D echo bubble study. Plan TEE per Dr. Byrum's request. Discussed with DOD Dr. Thukkani who is also in agreement. This has already been scheduled for 10/22/22 with Dr. Schumann. Instructions provided. Dr. McAlhany is a rounder for team C that day in case they need to discuss the results +/- whether structural consultation is needed.  Informed Consent    Shared Decision Making/Informed Consent The risks [esophageal damage, perforation (1:10,000 risk), bleeding, pharyngeal hematoma as well as other potential complications associated with conscious sedation including aspiration, arrhythmia, respiratory failure and death], benefits (treatment guidance and diagnostic support) and alternatives of a transesophageal echocardiogram were discussed in detail with Ms. Lamphier and she is willing to proceed. I confirmed with Trish, cardmaster, this has already been correctly placed on the schedule.     2. CAD - no recent anginal symptoms. Dr. McAlhany has maintained her on Plavix monotherapy. Update basic labs today as above. Continue atorvastatin. Lipids followed by PCP, reviewed above from 12/2021.  3. PACs, PVCs, SVT - one PAC noted on EKG. Asymptomatic. No acute concerns today. Follow clinically. Getting BMET/CBC anyway for pre-TEE as above. Will add TSH to updated labs as above. Recent Mg wnl.      Disposition: F/u with me in 3-4 weeks; may end up referring to structural heart team based on TEE result.   Medication Adjustments/Labs and Tests Ordered: Current medicines are reviewed at length with the patient today.  Concerns regarding medicines are outlined above. Medication changes, Labs and Tests ordered today are summarized above and listed in the Patient Instructions accessible in Encounters.   Signed, Giavana Rooke N Catherene Kaleta, PA-C  10/20/2022 3:28 PM    [facility] Phone: [phone]; Fax: [phone]  

## 2022-10-16 NOTE — Progress Notes (Signed)
Cardiology Office Note    Date:  10/20/2022   ID:  [name] [birth year], MRN [id number]  PCP:  Cynthia Rua, MD  Cardiologist:  Cynthia Carrow, MD  Electrophysiologist:  None   Chief Complaint: discuss TEE  History of Present Illness:   [name] is a 77 y.o. female with history of CAD with NSTEMI s/p DES to LAD and RCA in 2012, palpitations (PACs, PVCs, SVT), asthma, GERD, remote TIA, chronic hypoxemic respiratory failure on 3L home O2 with exertion with possible intracardiac shunt here today for cardiac follow up.  She had a NSTEMI in 2012 and had PCI as above. Cardiac monitor 2018 for palpitations showed sinus rhythm, rare PVCs, frequent PACs with several 6 beat runs of SVT. Nuclear stress test for dyspnea and fatigue in January 2019 showed  no ischemia. Her fatigue did not resolve off of Crestor; she is now on Lipitor. Last cath August 2019 showed patent LAD and RCA stents, severe stenosis in a small caliber obtuse marginal branch that was too small for PCI. She did not tolerate Toprol or Imdur. She has been maintained on Plavix monotherapy without ASA. More recently she's been following with Dr. Delton Oconnor for chronic hypoxemic respiratory failure out of proportion to her asthma. 2D echo 07/2022 showed EF 60-65%, no significant pulmonary HTN reported. VQ 07/2022 was negative for thromboembolic disease. High res CT 08/15/22 showed question of large pumonary AVM and scarring with bronchiectasis as well as coronary/aortic atherosclerosis. Echo with bubble study 09/24/22 showed EF 60-65%, G1DD, normal RV, aortic sclerosis without stenosis, and markedly positive saline microcavitation study; suggest TEE to RO ASD. Per notes, "Evaluation has been consistent with shunt and this was confirmed on her bubble study. Initially I thought this may be due to pulmonary vascular AVM but the bubble study would have been more consistent with an intracardiac shunt. " Dr. Delton Oconnor recommended TEE  therefore she is here today to discuss. Aside from chronic DOE she denies any new cardiac symptoms. No chest pain. She does not feel that palpitations have been a recent issue. We discussed TEE at length today.  Labwork independently reviewed: KPN 06/2022 ALT 14, Mg 2.0, K 3.7, Cr 0.830 KPN 12/2021 LDL 65, trig 97 01/2019 CMET wnl 2019 CBC wnl   Past History   Past Medical History:  Diagnosis Date   Allergy    Arthritis    COPD (chronic obstructive pulmonary disease) (HCC)    Coronary artery disease August 2012   Drug eluting stent mid LAD, drug eluting stent mid RCA   Depression    Emphysema    Heart attack [facility]) August 2012   Migraine    Osteoporosis    TIA (transient ischemic attack) 1995    Past Surgical History:  Procedure Laterality Date   APPENDECTOMY  01/1956   CATARACT EXTRACTION     CORONARY ANGIOPLASTY WITH STENT PLACEMENT  2012   FOOT SURGERY Bilateral    HAND SURGERY Left    LEFT HEART CATH AND CORONARY ANGIOGRAPHY N/A 12/18/2017   Procedure: LEFT HEART CATH AND CORONARY ANGIOGRAPHY;  Surgeon: Kathleene Hazel, MD;  Location: [facility];  Service: Cardiovascular;  Laterality: N/A;   NASAL SEPTUM SURGERY     SHOULDER SURGERY Left    frozen shoulder   THORACOTOMY     TONSILLECTOMY      Current Medications: Current Meds  Medication Sig   acetaminophen (TYLENOL) 500 MG tablet Take 500-1,000 mg by mouth every 6 (six) hours  as needed (headache.).   albuterol (VENTOLIN HFA) 108 (90 Base) MCG/ACT inhaler Inhale 2 puffs into the lungs every 6 (six) hours as needed for wheezing or shortness of breath.   atorvastatin (LIPITOR) 40 MG tablet TAKE 1 TABLET BY MOUTH EVERY DAY (Patient taking differently: Take 40 mg by mouth every evening.)   Cholecalciferol (VITAMIN D) 2000 UNITS CAPS Take 4,000 Units by mouth in the morning.   clopidogrel (PLAVIX) 75 MG tablet Take 1 tablet (75 mg total) by mouth daily.   denosumab (PROLIA) 60 MG/ML SOLN injection Inject 60  mg into the skin every 6 (six) months. Administer in upper arm, thigh, or abdomen   loratadine (CLARITIN) 10 MG tablet Take 10 mg by mouth in the morning.   nitroGLYCERIN (NITROSTAT) 0.4 MG SL tablet PLACE 1 TABLET (0.4 MG TOTAL) UNDER THE TONGUE EVERY 5 (FIVE) MINUTES AS NEEDED FOR CHEST PAIN.   sertraline (ZOLOFT) 100 MG tablet Take 50 mg by mouth in the morning.   topiramate (TOPAMAX) 100 MG tablet Take 100 mg by mouth every evening.   zolpidem (AMBIEN) 10 MG tablet Take 10 mg by mouth at bedtime as needed for sleep.      Allergies:   Naproxen, Parabens, Estradiol, and Tramadol hcl   Social History   Socioeconomic History   Marital status: Married    Spouse name: Not on file   Number of children: 2   Years of education: Not on file   Highest education level: Not on file  Occupational History   Occupation: retired  Tobacco Use   Smoking status: Former    Packs/day: 1.00    Years: 10.00    Additional pack years: 0.00    Total pack years: 10.00    Types: Cigarettes    Quit date: 05/06/1971    Years since quitting: 51.4   Smokeless tobacco: Never  Vaping Use   Vaping Use: Never used  Substance and Sexual Activity   Alcohol use: No   Drug use: No   Sexual activity: Not on file  Other Topics Concern   Not on file  Social History Narrative   Not on file   Social Determinants of Health   Financial Resource Strain: Not on file  Food Insecurity: Not on file  Transportation Needs: Not on file  Physical Activity: Not on file  Stress: Not on file  Social Connections: Not on file     Family History:  The patient's family history includes Diabetes in her maternal aunt, maternal grandmother, and mother; Heart disease in her paternal grandfather and paternal grandmother; Lung cancer in her maternal uncle; Rheum arthritis in her maternal grandfather and maternal grandmother.  ROS:   Please see the history of present illness.  All other systems are reviewed and otherwise  negative.    EKG(s)/Additional Testing   EKG:  EKG is ordered today, personally reviewed, demonstrating NSR 79bpm, occasional PACs, nonspecific STTW changes similar to prior  CV Studies: Cardiac studies reviewed are outlined and summarized above. Otherwise please see EMR for full report.  Recent Labs: No results found for requested labs within last 365 days.  Recent Lipid Panel    Component Value Date/Time   CHOL 135 07/29/2017 0942   TRIG 90 07/29/2017 0942   HDL 67 07/29/2017 0942   CHOLHDL 2.0 07/29/2017 0942   CHOLHDL 2 01/23/2011 1042   VLDL 10.6 01/23/2011 1042   LDLCALC 50 07/29/2017 0942    PHYSICAL EXAM:    VS:  BP 118/74  Pulse 79   Ht 5' 2.5" (1.588 m)   Wt 112 lb (50.8 kg)   SpO2 92%   BMI 20.16 kg/m   BMI: Body mass index is 20.16 kg/m.  GEN: Well nourished, well developed female in no acute distress HEENT: normocephalic, atraumatic Neck: no JVD, carotid bruits, or masses Cardiac: RRR; no murmurs, rubs, or gallops, no edema  Respiratory:  clear to auscultation bilaterally, normal work of breathing GI: soft, nontender, nondistended, + BS MS: no deformity or atrophy Skin: warm and dry, no rash Neuro:  Alert and Oriented x 3, Strength and sensation are intact, follows commands Psych: euthymic mood, full affect  Wt Readings from Last 3 Encounters:  10/20/22 112 lb (50.8 kg)  10/01/22 112 lb 3.2 oz (50.9 kg)  08/21/22 111 lb 9.6 oz (50.6 kg)     ASSESSMENT & PLAN:   1. Chronic hypoxemic respiratory failure (3L home O2 with exertion, no O2 at rest) with possible intracardiac shunt - question of ASD raised 2D echo bubble study. Plan TEE per Dr. Kavin Leech request. Discussed with DOD Dr. Lynnette Caffey who is also in agreement. This has already been scheduled for 10/22/22 with Dr. Bjorn Pippin. Instructions provided. Dr. Clifton James is a rounder for team C that day in case they need to discuss the results +/- whether structural consultation is needed.  Informed Consent    Shared Decision Making/Informed Consent The risks [esophageal damage, perforation (1:10,000 risk), bleeding, pharyngeal hematoma as well as other potential complications associated with conscious sedation including aspiration, arrhythmia, respiratory failure and death], benefits (treatment guidance and diagnostic support) and alternatives of a transesophageal echocardiogram were discussed in detail with [name] and she is willing to proceed. I confirmed with Trish, cardmaster, this has already been correctly placed on the schedule.     2. CAD - no recent anginal symptoms. Dr. Clifton James has maintained her on Plavix monotherapy. Update basic labs today as above. Continue atorvastatin. Lipids followed by PCP, reviewed above from 12/2021.  3. PACs, PVCs, SVT - one PAC noted on EKG. Asymptomatic. No acute concerns today. Follow clinically. Getting BMET/CBC anyway for pre-TEE as above. Will add TSH to updated labs as above. Recent Mg wnl.      Disposition: F/u with me in 3-4 weeks; may end up referring to structural heart team based on TEE result.   Medication Adjustments/Labs and Tests Ordered: Current medicines are reviewed at length with the patient today.  Concerns regarding medicines are outlined above. Medication changes, Labs and Tests ordered today are summarized above and listed in the Patient Instructions accessible in Encounters.   Signed, Laurann Montana, PA-C  10/20/2022 3:28 PM    [facility] Phone: [phone]; Fax: [phone]

## 2022-10-16 NOTE — Telephone Encounter (Signed)
Spoke to the pt, explained [name]-PA recommendation:  I have not met patient yet but we do not generally hold blood thinners for TEE. Recommend to discuss at OV. Thank you!    Patient voiced understanding

## 2022-10-20 ENCOUNTER — Ambulatory Visit: Payer: Medicare Other | Attending: Physician Assistant | Admitting: Physician Assistant

## 2022-10-20 ENCOUNTER — Encounter: Payer: Self-pay | Admitting: Physician Assistant

## 2022-10-20 VITALS — BP 118/74 | HR 79 | Ht 62.5 in | Wt 112.0 lb

## 2022-10-20 DIAGNOSIS — I2510 Atherosclerotic heart disease of native coronary artery without angina pectoris: Secondary | ICD-10-CM

## 2022-10-20 DIAGNOSIS — I493 Ventricular premature depolarization: Secondary | ICD-10-CM

## 2022-10-20 DIAGNOSIS — I471 Supraventricular tachycardia, unspecified: Secondary | ICD-10-CM | POA: Diagnosis not present

## 2022-10-20 DIAGNOSIS — J9611 Chronic respiratory failure with hypoxia: Secondary | ICD-10-CM | POA: Diagnosis not present

## 2022-10-20 DIAGNOSIS — I491 Atrial premature depolarization: Secondary | ICD-10-CM

## 2022-10-20 DIAGNOSIS — Q248 Other specified congenital malformations of heart: Secondary | ICD-10-CM | POA: Diagnosis not present

## 2022-10-20 NOTE — Patient Instructions (Addendum)
Medication Instructions:  Your physician recommends that you continue on your current medications as directed. Please refer to the Current Medication list given to you today.  *If you need a refill on your cardiac medications before your next appointment, please call your pharmacy*   Lab Work: BMET. CBC,  TSH If you have labs (blood work) drawn today and your tests are completely normal, you will receive your results only by: MyChart Message (if you have MyChart) OR A paper copy in the mail If you have any lab test that is abnormal or we need to change your treatment, we will call you to review the results.   Follow-Up: At [facility], you and your health needs are our priority.  As part of our continuing mission to provide you with exceptional heart care, we have created designated Provider Care Teams.  These Care Teams include your primary Cardiologist (physician) and Advanced Practice Providers (APPs -  Physician Assistants and Nurse Practitioners) who all work together to provide you with the care you need, when you need it.   Your next appointment:   4 week(s)  Provider:   D Dunn  Other Instructions     Dear [name]  You are scheduled for a TEE (Transesophageal Echocardiogram) on Wednesday, June 19 with Dr. Jerene Pitch.  Please arrive at the [facility] (Main Entrance A) at [facility]: [address] [place], [place] [address] at 11:30 (This time is 1 hour(s) before your procedure to ensure your preparation). Free valet parking service is available. You will check in at ADMITTING. The support person will be asked to wait in the waiting room.  It is OK to have someone drop you off and come back when you are ready to be discharged.     DIET:  Nothing to eat or drink after midnight except a sip of water with medications (see medication instructions below)  MEDICATION INSTRUCTIONS: !!IF ANY NEW MEDICATIONS ARE STARTED AFTER TODAY, PLEASE NOTIFY YOUR PROVIDER  AS SOON AS POSSIBLE!!  FYI: Medications such as Semaglutide (Ozempic, [place]), Tirzepatide (Mounjaro, Zepbound), Dulaglutide (Trulicity), etc ("GLP1 agonists") AND Canagliflozin (Invokana), Dapagliflozin (Farxiga), Empagliflozin (Jardiance), Ertugliflozin (Steglatro), Bexagliflozin [employer]) or any combination with one of these drugs such as Invokamet (Canagliflozin/Metformin), Synjardy (Empagliflozin/Metformin), etc ("SGLT2 inhibitors") must be held around the time of a procedure. This is not a comprehensive list of all of these drugs. Please review all of your medications and talk to your provider if you take any one of these. If you are not sure, ask your provider.    LABS:  Today   FYI:  For your safety, and to allow [place] to monitor your vital signs accurately during the surgery/procedure we request: If you have artificial nails, gel coating, SNS etc, please have those removed prior to your surgery/procedure. Not having the nail coverings /polish removed may result in cancellation or delay of your surgery/procedure.  You must have a responsible person to drive you home and stay in the waiting area during your procedure. Failure to do so could result in cancellation.  Bring your insurance cards.  *Special Note: Every effort is made to have your procedure done on time. Occasionally there are emergencies that occur at the hospital that may cause delays. Please be patient if a delay does occur.

## 2022-10-21 ENCOUNTER — Telehealth: Payer: Self-pay | Admitting: *Deleted

## 2022-10-21 DIAGNOSIS — E876 Hypokalemia: Secondary | ICD-10-CM

## 2022-10-21 DIAGNOSIS — Z79899 Other long term (current) drug therapy: Secondary | ICD-10-CM

## 2022-10-21 LAB — BASIC METABOLIC PANEL
BUN/Creatinine Ratio: 18 (ref 12–28)
BUN: 15 mg/dL (ref 8–27)
CO2: 22 mmol/L (ref 20–29)
Calcium: 9.4 mg/dL (ref 8.7–10.3)
Chloride: 107 mmol/L — ABNORMAL HIGH (ref 96–106)
Creatinine, Ser: 0.82 mg/dL (ref 0.57–1.00)
Glucose: 78 mg/dL (ref 70–99)
Potassium: 3.5 mmol/L (ref 3.5–5.2)
Sodium: 140 mmol/L (ref 134–144)
eGFR: 74 mL/min/{1.73_m2} (ref >59)

## 2022-10-21 LAB — CBC
Hematocrit: 42.2 % (ref 34.0–46.6)
Hemoglobin: 13.6 g/dL (ref 11.1–15.9)
MCH: 28.8 pg (ref 26.6–33.0)
MCHC: 32.2 g/dL (ref 31.5–35.7)
MCV: 89 fL (ref 79–97)
Platelets: 324 10*3/uL (ref 150–450)
RBC: 4.73 x10E6/uL (ref 3.77–5.28)
RDW: 13.3 % (ref 11.7–15.4)
WBC: 7.9 10*3/uL (ref 3.4–10.8)

## 2022-10-21 LAB — TSH: TSH: 3.29 u[IU]/mL (ref 0.450–4.500)

## 2022-10-21 MED ORDER — POTASSIUM CHLORIDE CRYS ER 20 MEQ PO TBCR: 20.0000 meq | EXTENDED_RELEASE_TABLET | Freq: Every day | ORAL | 3 refills | Status: DC

## 2022-10-21 NOTE — Pre-Procedure Instructions (Signed)
Spoke with patient on the phone regarding TEE tomorrow:  Instructed patient to arrive at 0830, NPO after midnight, take pills in the AM with a sip of water Confirmed patient will have ride home and responsible person to stay with her for 24 hours after procedure

## 2022-10-21 NOTE — Telephone Encounter (Signed)
  Laurann Montana, PA-C 10/21/2022  7:55 AM EDT Back to Top    Pre-TEE labs are stable except chloride just 1 pt above normal (stable from prior) and potassium slightly below goal of 4.0 for h/o PACs. Recommend adding KCl daily with recheck BMET in 1 week. Otherwise keep plan as discussed.    Pt aware of the above orders.   RX sent into [facility], [place] as requested. Lab ordered and scheduled for Thursday June 27.

## 2022-10-22 ENCOUNTER — Encounter (HOSPITAL_COMMUNITY)
Admission: RE | Disposition: A | Discharge status: Home / Self Care | Payer: Self-pay | Source: Home / Self Care | Attending: Cardiology

## 2022-10-22 ENCOUNTER — Ambulatory Visit (HOSPITAL_COMMUNITY)
Admission: RE | Admit: 2022-10-22 | Discharge: 2022-10-22 | Disposition: A | Discharge status: Home / Self Care | Payer: Medicare Other | Attending: Cardiology | Admitting: Cardiology

## 2022-10-22 ENCOUNTER — Ambulatory Visit (HOSPITAL_COMMUNITY): Payer: Medicare Other | Admitting: Certified Registered Nurse Anesthetist

## 2022-10-22 ENCOUNTER — Ambulatory Visit (HOSPITAL_BASED_OUTPATIENT_CLINIC_OR_DEPARTMENT_OTHER)
Admission: RE | Admit: 2022-10-22 | Discharge: 2022-10-22 | Disposition: A | Discharge status: Home / Self Care | Payer: Medicare Other | Source: Ambulatory Visit | Attending: Physician Assistant | Admitting: Physician Assistant

## 2022-10-22 ENCOUNTER — Ambulatory Visit (HOSPITAL_BASED_OUTPATIENT_CLINIC_OR_DEPARTMENT_OTHER): Payer: Medicare Other | Admitting: Certified Registered Nurse Anesthetist

## 2022-10-22 ENCOUNTER — Other Ambulatory Visit: Payer: Self-pay

## 2022-10-22 DIAGNOSIS — I2510 Atherosclerotic heart disease of native coronary artery without angina pectoris: Secondary | ICD-10-CM | POA: Insufficient documentation

## 2022-10-22 DIAGNOSIS — Z79899 Other long term (current) drug therapy: Secondary | ICD-10-CM | POA: Insufficient documentation

## 2022-10-22 DIAGNOSIS — J9611 Chronic respiratory failure with hypoxia: Secondary | ICD-10-CM | POA: Diagnosis not present

## 2022-10-22 DIAGNOSIS — I471 Supraventricular tachycardia, unspecified: Secondary | ICD-10-CM | POA: Insufficient documentation

## 2022-10-22 DIAGNOSIS — Q211 Atrial septal defect, unspecified: Secondary | ICD-10-CM | POA: Diagnosis not present

## 2022-10-22 DIAGNOSIS — I493 Ventricular premature depolarization: Secondary | ICD-10-CM | POA: Insufficient documentation

## 2022-10-22 DIAGNOSIS — Q248 Other specified congenital malformations of heart: Secondary | ICD-10-CM

## 2022-10-22 DIAGNOSIS — Z87891 Personal history of nicotine dependence: Secondary | ICD-10-CM | POA: Diagnosis not present

## 2022-10-22 DIAGNOSIS — R069 Unspecified abnormalities of breathing: Secondary | ICD-10-CM

## 2022-10-22 DIAGNOSIS — R0609 Other forms of dyspnea: Secondary | ICD-10-CM | POA: Diagnosis not present

## 2022-10-22 DIAGNOSIS — J449 Chronic obstructive pulmonary disease, unspecified: Secondary | ICD-10-CM

## 2022-10-22 DIAGNOSIS — Z7902 Long term (current) use of antithrombotics/antiplatelets: Secondary | ICD-10-CM | POA: Insufficient documentation

## 2022-10-22 HISTORY — PX: TEE WITHOUT CARDIOVERSION: SHX5443

## 2022-10-22 LAB — ECHO TEE
AV Mean grad: 2 mmHg
AV Peak grad: 3.6 mmHg
Ao pk vel: 0.95 m/s

## 2022-10-22 SURGERY — ECHOCARDIOGRAM, TRANSESOPHAGEAL
Anesthesia: Monitor Anesthesia Care

## 2022-10-22 MED ORDER — PROPOFOL 500 MG/50ML IV EMUL
INTRAVENOUS | Status: DC | PRN
  Administered 2022-10-22: 150 ug/kg/min via INTRAVENOUS

## 2022-10-22 MED ORDER — PROPOFOL 10 MG/ML IV BOLUS
INTRAVENOUS | Status: DC | PRN
  Administered 2022-10-22: 80 mg via INTRAVENOUS

## 2022-10-22 MED ORDER — SODIUM CHLORIDE 0.9 % IV SOLN: INTRAVENOUS | Status: DC

## 2022-10-22 NOTE — Discharge Instructions (Signed)

## 2022-10-22 NOTE — Transfer of Care (Signed)
Immediate Anesthesia Transfer of Care Note  Patient: [name]  Procedure(s) Performed: TRANSESOPHAGEAL ECHOCARDIOGRAM  Patient Location: Cath Lab  Anesthesia Type:MAC  Level of Consciousness: awake, alert , and oriented  Airway & Oxygen Therapy: Patient Spontanous Breathing  Post-op Assessment: Report given to RN and Post -op Vital signs reviewed and stable  Post vital signs: Reviewed and stable  Last Vitals:  Vitals Value Taken Time  BP 116/64   Temp 98   Pulse 70   Resp 16   SpO2 98     Last Pain:  Vitals:   10/22/22 0826  TempSrc: Temporal         Complications: No notable events documented.

## 2022-10-22 NOTE — Anesthesia Preprocedure Evaluation (Signed)
Anesthesia Evaluation  Patient identified by MRN, date of birth, ID band Patient awake    Reviewed: Allergy & Precautions, NPO status , Patient's Chart, lab work & pertinent test results  Airway Mallampati: II  TM Distance: >3 FB Neck ROM: Full    Dental  (+) Dental Advisory Given, Caps   Pulmonary asthma , COPD, former smoker   Pulmonary exam normal breath sounds clear to auscultation       Cardiovascular + CAD, + Past MI and + Cardiac Stents  Normal cardiovascular exam Rhythm:Regular Rate:Normal  ASD   Neuro/Psych  Headaches PSYCHIATRIC DISORDERS  Depression    TIA   GI/Hepatic negative GI ROS, Neg liver ROS,,,  Endo/Other  negative endocrine ROS    Renal/GU negative Renal ROS     Musculoskeletal  (+) Arthritis ,    Abdominal   Peds  Hematology  (+) Blood dyscrasia (Plavix)   Anesthesia Other Findings Day of surgery medications reviewed with the patient.  Reproductive/Obstetrics                             Anesthesia Physical Anesthesia Plan  ASA: 3  Anesthesia Plan: MAC   Post-op Pain Management: Minimal or no pain anticipated   Induction: Intravenous  PONV Risk Score and Plan: 2 and TIVA and Treatment may vary due to age or medical condition  Airway Management Planned: Simple Face Mask and Natural Airway  Additional Equipment:   Intra-op Plan:   Post-operative Plan:   Informed Consent: I have reviewed the patients History and Physical, chart, labs and discussed the procedure including the risks, benefits and alternatives for the proposed anesthesia with the patient or authorized representative who has indicated his/her understanding and acceptance.     Dental advisory given  Plan Discussed with: CRNA  Anesthesia Plan Comments:        Anesthesia Quick Evaluation

## 2022-10-22 NOTE — Interval H&P Note (Signed)
History and Physical Interval Note:  10/22/2022 8:53 AM  [name]  has presented today for surgery, with the diagnosis of ASD.  The various methods of treatment have been discussed with the patient and family. After consideration of risks, benefits and other options for treatment, the patient has consented to  Procedure(s): TRANSESOPHAGEAL ECHOCARDIOGRAM (N/A) as a surgical intervention.  The patient's history has been reviewed, patient examined, no change in status, stable for surgery.  I have reviewed the patient's chart and labs.  Questions were answered to the patient's satisfaction.     Little Ishikawa

## 2022-10-22 NOTE — CV Procedure (Signed)
     TRANSESOPHAGEAL ECHOCARDIOGRAM   NAME:  [name]   MRN: [id number] DOB:  July 26, 1945   ADMIT DATE: 10/22/2022  INDICATIONS: Evaluate for intracardiac shunt  PROCEDURE:   Informed consent was obtained prior to the procedure. The risks, benefits and alternatives for the procedure were discussed and the patient comprehended these risks.  Risks include, but are not limited to, cough, sore throat, vomiting, nausea, somnolence, esophageal and stomach trauma or perforation, bleeding, low blood pressure, aspiration, pneumonia, infection, trauma to the teeth and death.    After a procedural time-out, the oropharynx was anesthetized and the patient was sedated by the anesthesia service. The transesophageal probe was inserted in the esophagus and stomach without difficulty and multiple views were obtained. Anesthesia was monitored by Lindwood Qua, CRNA.    COMPLICATIONS:    There were no immediate complications.  FINDINGS:  Markedly positive bubble study.  No ASD seen.  Repeating bubble study while visualizing right upper pulmonary vein shows bubbles coming into left atrium through pulmonary vein.  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

## 2022-10-23 ENCOUNTER — Telehealth: Payer: Self-pay | Admitting: Physician Assistant

## 2022-10-23 ENCOUNTER — Encounter (HOSPITAL_COMMUNITY): Payer: Self-pay | Admitting: Cardiology

## 2022-10-23 DIAGNOSIS — K080 Exfoliation of teeth due to systemic causes: Secondary | ICD-10-CM | POA: Diagnosis not present

## 2022-10-23 NOTE — Telephone Encounter (Signed)
Thank you very much - very helpful!

## 2022-10-23 NOTE — Telephone Encounter (Signed)
   For documentation purposes, moving this note over to phone note. TEE was requested by pulmonology for question of ASD.  TEE done 10/22/22 with results below:  1. Left ventricular ejection fraction, by estimation, is 60 to 65%. The left ventricle has normal function. The left ventricle has no regional wall motion abnormalities.  2. Right ventricular systolic function is normal. The right ventricular size is normal.  3. No left atrial/left atrial appendage thrombus was detected.  4. The mitral valve is normal in structure. Trivial mitral valve regurgitation.  5. The aortic valve is tricuspid. Aortic valve regurgitation is not visualized. No aortic stenosis is present.  6. Agitated saline contrast bubble study was positive with shunting observed within 3-6 cardiac cycles.  7. Markedly positive bubble study. No ASD seen. Repeating bubble study while visualizing right upper pulmonary vein shows bubbles coming into left atrium through pulmonary vein. Had prior CT chest showing connection between right pulmonary artery and  pulmonary vein, suspect this is the source of patient's right to left shunt  Discussed result with primary cardiologist Dr. Clifton James who recommends pulmonology review further management since there was no intrinsic cardiac issue or ASD/cardiac shunt identified. Will route to Dr. Delton Coombes for his review.

## 2022-10-26 NOTE — Anesthesia Postprocedure Evaluation (Signed)
Anesthesia Post Note  Patient: [name]  Procedure(s) Performed: TRANSESOPHAGEAL ECHOCARDIOGRAM     Patient location during evaluation: Cath Lab Anesthesia Type: MAC Level of consciousness: patient cooperative and awake Pain management: pain level controlled Vital Signs Assessment: post-procedure vital signs reviewed and stable Respiratory status: spontaneous breathing, nonlabored ventilation, respiratory function stable and patient connected to nasal cannula oxygen Cardiovascular status: stable and blood pressure returned to baseline Postop Assessment: no apparent nausea or vomiting Anesthetic complications: no   No notable events documented.  Last Vitals:  Vitals:   10/22/22 0940 10/22/22 0950  BP: 98/60 113/69  Pulse: 66   Resp: 19   Temp:    SpO2: 92%     Last Pain:  Vitals:   10/22/22 0933  TempSrc: Temporal  PainSc: 0-No pain                 Neetu Carrozza

## 2022-10-30 ENCOUNTER — Ambulatory Visit: Payer: Medicare Other | Attending: Cardiology

## 2022-10-30 DIAGNOSIS — E876 Hypokalemia: Secondary | ICD-10-CM | POA: Diagnosis not present

## 2022-10-30 DIAGNOSIS — Z79899 Other long term (current) drug therapy: Secondary | ICD-10-CM

## 2022-10-31 ENCOUNTER — Telehealth: Payer: Self-pay

## 2022-10-31 DIAGNOSIS — Z79899 Other long term (current) drug therapy: Secondary | ICD-10-CM

## 2022-10-31 DIAGNOSIS — E876 Hypokalemia: Secondary | ICD-10-CM

## 2022-10-31 LAB — BASIC METABOLIC PANEL
BUN/Creatinine Ratio: 18 (ref 12–28)
BUN: 17 mg/dL (ref 8–27)
CO2: 21 mmol/L (ref 20–29)
Calcium: 9.2 mg/dL (ref 8.7–10.3)
Chloride: 106 mmol/L (ref 96–106)
Creatinine, Ser: 0.97 mg/dL (ref 0.57–1.00)
Glucose: 70 mg/dL (ref 70–99)
Potassium: 3.9 mmol/L (ref 3.5–5.2)
Sodium: 140 mmol/L (ref 134–144)
eGFR: 61 mL/min/{1.73_m2} (ref >59)

## 2022-10-31 MED ORDER — POTASSIUM CHLORIDE CRYS ER 20 MEQ PO TBCR: 40.0000 meq | EXTENDED_RELEASE_TABLET | Freq: Every day | ORAL | 1 refills | Status: DC

## 2022-10-31 NOTE — Telephone Encounter (Signed)
-----   Message from Laurann [place], [place] sent at 10/31/2022  7:57 AM EDT ----- Please let pt know that potassium definitely improved with addition of potassium supplement but is still slightly below goal of 4.0 for h/o PACs, PVCs. Increase KCl to daily and recheck BMET in about 1 week. Would make sure to get labs drawn a few hours after taking AM dose of potassium that day. Thank you!

## 2022-10-31 NOTE — Telephone Encounter (Signed)
Called and spoke to patient who verbalized understanding of increasing Potassium to (2 tablets) daily. Medication sent to mail order per request as she will run out early if doubling, but does have enough to last until she will get the new dose Rx. BMET entered and scheduled for 11/10/22.

## 2022-11-10 ENCOUNTER — Ambulatory Visit: Payer: Medicare Other | Attending: Cardiovascular Disease

## 2022-11-10 DIAGNOSIS — Z79899 Other long term (current) drug therapy: Secondary | ICD-10-CM | POA: Diagnosis not present

## 2022-11-10 DIAGNOSIS — E876 Hypokalemia: Secondary | ICD-10-CM | POA: Diagnosis not present

## 2022-11-10 LAB — BASIC METABOLIC PANEL
BUN/Creatinine Ratio: 22 (ref 12–28)
BUN: 19 mg/dL (ref 8–27)
CO2: 20 mmol/L (ref 20–29)
Calcium: 9.6 mg/dL (ref 8.7–10.3)
Chloride: 105 mmol/L (ref 96–106)
Creatinine, Ser: 0.88 mg/dL (ref 0.57–1.00)
Glucose: 86 mg/dL (ref 70–99)
Potassium: 4.1 mmol/L (ref 3.5–5.2)
Sodium: 139 mmol/L (ref 134–144)
eGFR: 68 mL/min/{1.73_m2} (ref >59)

## 2022-11-11 DIAGNOSIS — J4520 Mild intermittent asthma, uncomplicated: Secondary | ICD-10-CM | POA: Diagnosis not present

## 2022-11-19 ENCOUNTER — Encounter: Payer: Self-pay | Admitting: Emergency Medicine

## 2022-11-19 ENCOUNTER — Ambulatory Visit: Payer: Medicare Other | Admitting: Emergency Medicine

## 2022-11-19 ENCOUNTER — Ambulatory Visit: Payer: Medicare Other | Admitting: Physician Assistant

## 2022-11-19 VITALS — BP 128/68 | HR 90 | Temp 98.7°F | Ht 62.5 in | Wt 110.8 lb

## 2022-11-19 DIAGNOSIS — J4520 Mild intermittent asthma, uncomplicated: Secondary | ICD-10-CM

## 2022-11-19 DIAGNOSIS — Q2572 Congenital pulmonary arteriovenous malformation: Secondary | ICD-10-CM | POA: Insufficient documentation

## 2022-11-19 NOTE — Assessment & Plan Note (Signed)
 Continue your inhaled medication as you have been taking it. °

## 2022-11-19 NOTE — Patient Instructions (Signed)
We will investigate options to allow possible embolization and closure of a pulmonary arteriovenous fistula. Continue oxygen at 2-3 L/min.  Our goal is to keep your oxygen saturations 90% or higher Continue your inhaled medication as you have been taking it Follow Dr. Delton Coombes in 1 month or next available opening

## 2022-11-19 NOTE — Assessment & Plan Note (Signed)
Confirmed on bubble study.  I believe this is contributing to her exertional dyspnea and her hypoxemia.  Discussed the case with Dr. Archer Asa with IR and embolization is an option.  She is willing to discuss this with him.  We will arrange for that consultation.  We will investigate options to allow possible embolization and closure of a pulmonary arteriovenous fistula. Continue oxygen at 2-3 L/min.  Our goal is to keep your oxygen saturations 90% or higher Follow Dr. Delton Coombes in 1 month or next available opening

## 2022-11-19 NOTE — Addendum Note (Signed)
Addended by: Dorisann Frames R on: 11/19/2022 04:05 PM   Modules accepted: Orders

## 2022-11-19 NOTE — Progress Notes (Signed)
Subjective:    Patient ID: [name], female    DOB: Sep 27, 1945, 77 y.o.   MRN: [id number]  HPI Comments:   ROV 08/21/22 --Josi is 74 with history of mild intermittent asthma, chronic cough and chronic rhinitis.  She has been experiencing progressive dyspnea and exertional hypoxemia that seem to be out of a portion to her obstructive lung disease.  Echocardiogram showed normal RV size and function but the tricuspid regurgitation signal was inadequate for assessing her PA pressures.  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  She has some chronic posterior biapical bandlike scarring and some varicoid bronchiectatic change and volume loss.  ROV 10/01/22 --follow-up visit for 77 year old woman with mild intermittent asthma, rhinitis and chronic cough.  We have been evaluating her most recently for progressive exertional dyspnea and exertional hypoxemia that have been out of proportion to her asthma.  Initial echocardiogram showed normal RV size and function.  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  This prompted me to obtain an echo bubble study which was done on 09/24/2022  Echocardiogram with bubble study 09/24/2022 report reviewed by me showed a positive saline study with agitated bubbles evident at 3-6 cardiac cycles.  Consider intra-atrial shunting, ASD.  ROV 11/19/22 --follow-up visit 77 year old woman with dyspnea.  She has mild intermittent asthma, chronic rhinitis with some associated chronic cough.  We have been evaluating hypoxemia and dyspnea on proportion to her obstructive lung disease.  She has normal RV size and function but bubble study was positive for shunting.  Her TEE was done 10/22/2022.  There was no ASD.  They were able to visualize the right upper pulmonary vein that showed bubbles coming into the left atrium through  the pulmonary vein which appears to be the source of her shunt.  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 She is using 2.5L/min w exertion.     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  I believe this is contributing to her exertional dyspnea and her hypoxemia.  Discussed the case with Dr. Archer Asa with IR and embolization is an option.  She is willing to discuss this with him.  We will arrange for that consultation.  We will investigate options to allow possible embolization and closure of a pulmonary arteriovenous fistula. Continue oxygen at 2-3 L/min.  Our goal is to keep your oxygen saturations 90% or higher Follow Dr. Delton Coombes in 1 month or next available opening  Mild intermittent asthma Continue your inhaled medication as you have been taking it        Levy Pupa, MD, PhD 11/19/2022, 12:12 PM [facility] [phone] or if no answer  [phone] 

## 2022-11-20 ENCOUNTER — Other Ambulatory Visit: Payer: Self-pay | Admitting: Emergency Medicine

## 2022-11-20 ENCOUNTER — Ambulatory Visit
Admission: RE | Admit: 2022-11-20 | Discharge: 2022-11-20 | Disposition: A | Discharge status: Home / Self Care | Payer: Medicare Other | Source: Ambulatory Visit | Attending: Emergency Medicine | Admitting: Emergency Medicine

## 2022-11-20 ENCOUNTER — Other Ambulatory Visit: Payer: Self-pay | Admitting: Interventional Radiology

## 2022-11-20 DIAGNOSIS — Q2572 Congenital pulmonary arteriovenous malformation: Secondary | ICD-10-CM

## 2022-11-20 HISTORY — PX: IR RADIOLOGIST EVAL & MGMT: IMG5224

## 2022-11-20 NOTE — Consult Note (Signed)
Chief Complaint: Patient was seen in consultation today for pulmonary AVM at the request of Byrum,Robert S  Referring Physician(s): Byrum,Robert S  History of Present Illness: [name] is a 77 y.o. 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 Levy Pupa.  Dr. Delton Coombes was seeing her for new onset hypoxemic respiratory failure.  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  This is seen on high-resolution noncontrast enhanced CT imaging is a tubular structure.  I have evaluated these images and confirmed that I believe there is a feeding artery and draining vein.  Additionally, at the time of echocardiography bubble study, bubbles were seen in a abnormal vascular structure within the lung adjacent to the heart further confirming the suspicion of a pulmonary arteriovenous malformation.  [name] presents today to the interventional radiology clinic in [place].  She is accompanied by her husband.  She denies current shortness of breath, chest pain, symptoms of transient ischemic attack or stroke.  We discussed the natural history of pulmonary AVMs at length as well as the potential complications.  Past Medical History:  Diagnosis Date   Allergy    Arthritis    COPD (chronic obstructive pulmonary disease) (HCC)    Coronary artery disease August 2012   Drug eluting stent mid LAD, drug eluting stent mid RCA   Depression    Emphysema    Heart attack [facility]) August 2012   Migraine    Osteoporosis    TIA (transient ischemic attack) 1995    Past Surgical History:  Procedure Laterality Date   APPENDECTOMY  01/1956   CATARACT EXTRACTION     CORONARY ANGIOPLASTY WITH STENT PLACEMENT  2012   FOOT SURGERY Bilateral    HAND SURGERY Left    IR RADIOLOGIST EVAL & MGMT   11/20/2022   LEFT HEART CATH AND CORONARY ANGIOGRAPHY N/A 12/18/2017   Procedure: LEFT HEART CATH AND CORONARY ANGIOGRAPHY;  Surgeon: Kathleene Hazel, MD;  Location: [facility] INVASIVE CV LAB;  Service: Cardiovascular;  Laterality: N/A;   NASAL SEPTUM SURGERY     SHOULDER SURGERY Left    frozen shoulder   TEE WITHOUT CARDIOVERSION N/A 10/22/2022   Procedure: TRANSESOPHAGEAL ECHOCARDIOGRAM;  Surgeon: Little Ishikawa, MD;  Location: [facility] INVASIVE CV LAB;  Service: Cardiovascular;  Laterality: N/A;   THORACOTOMY     TONSILLECTOMY      Allergies: Naproxen, Parabens, Estradiol, and Tramadol hcl  Medications: Prior to Admission medications   Medication Sig Start Date End Date Taking? Authorizing Provider  acetaminophen (TYLENOL) 500 MG tablet Take 500-1,000 mg by mouth every 6 (six) hours as needed (headache.).    [provider]  albuterol (VENTOLIN HFA) 108 (90 Base) MCG/ACT inhaler Inhale 2 puffs into the lungs every 6 (six) hours as needed for wheezing or shortness of breath. 10/01/22   Byrum, Les Pou, MD  atorvastatin (LIPITOR) 40 MG tablet TAKE 1 TABLET BY MOUTH EVERY DAY Patient taking differently: Take 40 mg by mouth every evening. 10/11/19   Kathleene Hazel, MD  Cholecalciferol (VITAMIN D) 2000 UNITS CAPS Take 4,000 Units by mouth in the morning.    [provider]  clopidogrel (PLAVIX) 75 MG tablet Take 1 tablet (75 mg total) by mouth daily. 09/19/22   Kathleene Hazel, MD  denosumab (PROLIA) 60 MG/ML SOLN injection Inject 60 mg into the  skin every 6 (six) months. Administer in upper arm, thigh, or abdomen    [provider]  loratadine (CLARITIN) 10 MG tablet Take 10 mg by mouth in the morning. 02/14/19   [provider]  nitroGLYCERIN (NITROSTAT) 0.4 MG SL tablet PLACE 1 TABLET (0.4 MG TOTAL) UNDER THE TONGUE EVERY 5 (FIVE) MINUTES AS NEEDED FOR CHEST PAIN. 03/19/22   Kathleene Hazel, MD  potassium chloride SA (KLOR-CON M)  20 MEQ tablet Take 2 tablets (40 mEq total) by mouth daily. 10/31/22   Dunn, Tacey Ruiz, PA-C  sertraline (ZOLOFT) 100 MG tablet Take 50 mg by mouth in the morning. 04/06/16   [provider]  topiramate (TOPAMAX) 100 MG tablet Take 100 mg by mouth every evening.    [provider]  zolpidem (AMBIEN) 10 MG tablet Take 10 mg by mouth at bedtime as needed for sleep.     [provider]     Family History  Problem Relation Age of Onset   Heart disease Paternal Grandmother    Heart disease Paternal Grandfather    Rheum arthritis Maternal Grandmother    Diabetes Maternal Grandmother    Rheum arthritis Maternal Grandfather    Lung cancer Maternal Uncle    Diabetes Mother    Diabetes Maternal Aunt     Social History   Socioeconomic History   Marital status: Married    Spouse name: Not on file   Number of children: 2   Years of education: Not on file   Highest education level: Not on file  Occupational History   Occupation: retired  Tobacco Use   Smoking status: Former    Current packs/day: 0.00    Average packs/day: 1 pack/day for 10.0 years (10.0 ttl pk-yrs)    Types: Cigarettes    Start date: 05/05/1961    Quit date: 05/06/1971    Years since quitting: 51.5   Smokeless tobacco: Never  Vaping Use   Vaping status: Never Used  Substance and Sexual Activity   Alcohol use: No   Drug use: No   Sexual activity: Not on file  Other Topics Concern   Not on file  Social History Narrative   Not on file   Social Determinants of Health   Financial Resource Strain: Not on file  Food Insecurity: Not on file  Transportation Needs: Not on file  Physical Activity: Not on file  Stress: Not on file  Social Connections: Unknown (09/17/2021)   [facility]   Social Network    Social Network: Not on file     Review of Systems: A 12 point ROS discussed and pertinent positives are indicated in the HPI above.  All other systems are negative.  Review of  Systems  Vital Signs: BP 123/77 (BP Location: Left Arm, Patient Position: Sitting, Cuff Size: Normal)   Pulse 76   Temp 97.6 F (36.4 C) (Oral)   SpO2 (!) 87%   Advance Care Plan: The advanced care plan/surrogate decision maker was discussed at the time of visit and the patient did not wish to discuss or was not able to name a surrogate decision maker or provide an advance care plan.    Physical Exam Constitutional:      General: She is not in acute distress.    Appearance: Normal appearance. She is normal weight.  HENT:     Head: Normocephalic and atraumatic.  Eyes:     General: No scleral icterus. Cardiovascular:     Rate and  Rhythm: Normal rate.  Pulmonary:     Effort: Pulmonary effort is normal.  Abdominal:     General: Abdomen is flat. There is no distension.     Palpations: Abdomen is soft.  Skin:    General: Skin is warm and dry.  Neurological:     Mental Status: She is alert and oriented to person, place, and time.  Psychiatric:        Mood and Affect: Mood normal.        Behavior: Behavior normal.       Imaging: IR Radiologist Eval & Mgmt  Result Date: 11/20/2022 EXAM: NEW PATIENT OFFICE VISIT CHIEF COMPLAINT: SEE EPIC NOTE HISTORY OF PRESENT ILLNESS: SEE EPIC NOTE REVIEW OF SYSTEMS: SEE EPIC NOTE PHYSICAL EXAMINATION: SEE EPIC NOTE ASSESSMENT AND PLAN: SEE EPIC NOTE Electronically Signed   By: Malachy Moan M.D.   On: 11/20/2022 11:19   ECHO TEE  Result Date: 10/22/2022    TRANSESOPHOGEAL ECHO REPORT   Patient Name:   BIANCO CANGE Date of Exam: 10/22/2022 Medical Rec #:  [id number]       Height:       62.0 in Accession #:    [id number]      Weight:       111.0 lb Date of Birth:  1945-05-16       BSA:          1.488 m Patient Age:    17 years        BP:           137/75 mmHg Patient Gender: F               HR:           70 bpm. Exam Location:  Inpatient Procedure: Transesophageal Echo, Color Doppler, Cardiac Doppler and Saline            Contrast Bubble  Study Indications:     R06.9 DOE  History:         Patient has prior history of Echocardiogram examinations, most                  recent 09/24/2022. CAD, COPD; Risk Factors:Dyslipidemia.  Sonographer:     Irving Burton Senior RDCS Referring Phys:  [id number] Tacey Ruiz DUNN Diagnosing Phys: Epifanio Lesches MD PROCEDURE: After discussion of the risks and benefits of a TEE, an informed consent was obtained from the patient. The transesophogeal probe was passed without difficulty through the esophogus of the patient. Sedation performed by different physician. The patient was monitored while under deep sedation. Anesthestetic sedation was provided intravenously by Anesthesiology: 208mg  of Propofol. The patient developed no complications during the procedure.  IMPRESSIONS  1. Left ventricular ejection fraction, by estimation, is 60 to 65%. The left ventricle has normal function. The left ventricle has no regional wall motion abnormalities.  2. Right ventricular systolic function is normal. The right ventricular size is normal.  3. No left atrial/left atrial appendage thrombus was detected.  4. The mitral valve is normal in structure. Trivial mitral valve regurgitation.  5. The aortic valve is tricuspid. Aortic valve regurgitation is not visualized. No aortic stenosis is present.  6. Agitated saline contrast bubble study was positive with shunting observed within 3-6 cardiac cycles.  7. Markedly positive bubble study. No ASD seen. Repeating bubble study while visualizing right upper pulmonary vein shows bubbles coming into left atrium through pulmonary vein. Had prior CT chest showing connection between right pulmonary artery and pulmonary vein,  suspect this is the source of patient's right to left shunt FINDINGS  Left Ventricle: Left ventricular ejection fraction, by estimation, is 60 to 65%. The left ventricle has normal function. The left ventricle has no regional wall motion abnormalities. The left ventricular internal cavity  size was normal in size. Right Ventricle: The right ventricular size is normal. No increase in right ventricular wall thickness. Right ventricular systolic function is normal. Left Atrium: Left atrial size was normal in size. No left atrial/left atrial appendage thrombus was detected. Right Atrium: Right atrial size was normal in size. Pericardium: There is no evidence of pericardial effusion. Mitral Valve: The mitral valve is normal in structure. Trivial mitral valve regurgitation. Tricuspid Valve: The tricuspid valve is normal in structure. Tricuspid valve regurgitation is trivial. Aortic Valve: The aortic valve is tricuspid. Aortic valve regurgitation is not visualized. No aortic stenosis is present. Aortic valve mean gradient measures 2.0 mmHg. Aortic valve peak gradient measures 3.6 mmHg. Pulmonic Valve: The pulmonic valve was grossly normal. Pulmonic valve regurgitation is trivial. Aorta: The aortic root and ascending aorta are structurally normal, with no evidence of dilitation. IAS/Shunts: No atrial level shunt detected by color flow Doppler. Agitated saline contrast was given intravenously to evaluate for intracardiac shunting. Agitated saline contrast bubble study was positive with shunting observed within 3-6 cardiac cycles suggestive of interatrial shunt.  AORTIC VALVE AV Vmax:      94.90 cm/s AV Vmean:     70.700 cm/s AV VTI:       0.239 m AV Peak Grad: 3.6 mmHg AV Mean Grad: 2.0 mmHg  AORTA Ao Asc diam: 3.00 cm Epifanio Lesches MD Electronically signed by Epifanio Lesches MD Signature Date/Time: 10/22/2022/10:43:09 AM    Final    EP STUDY  Result Date: 10/22/2022 See surgical note for result.   Labs:  CBC: Recent Labs    10/20/22 1602  WBC 7.9  HGB 13.6  HCT 42.2  PLT 324    COAGS: No results for input(s): "INR", "APTT" in the last 8760 hours.  BMP: Recent Labs    10/20/22 1602 10/30/22 1512 11/10/22 1214  NA 140 140 139  K 3.5 3.9 4.1  CL 107* 106 105  CO2 22 21 20    GLUCOSE 78 70 86  BUN 15 17 19   CALCIUM 9.4 9.2 9.6  CREATININE 0.82 0.97 0.88    LIVER FUNCTION TESTS: No results for input(s): "BILITOT", "AST", "ALT", "ALKPHOS", "PROT", "ALBUMIN" in the last 8760 hours.  TUMOR MARKERS: No results for input(s): "AFPTM", "CEA", "CA199", "CHROMGRNA" in the last 8760 hours.  Assessment and Plan:  Extremely pleasant 77 year old female with a pulmonary arteriovenous malformation.  Visualization is somewhat limited in the absence of intravenous contrast, but I suspect that the feeding vessel is just larger than 3 mm which would be an indication for treatment.  We discussed the natural history of pulmonary arteriovenous malformations as well as the potential complications including paradoxical stroke and brain abscess.  First, I would like to fully evaluate this with a high-quality CT arteriogram of the chest focusing on the pulmonary arteries.  She lives in [place] and so the imaging center at [facility] would be extremely convenient.  We will then plan to schedule her for pulmonary arteriogram and AVM embolization which we can perform at [facility].  1.)  Please schedule for CTA Chest PE Protocol -she lives in [place] and so [facility] would be most convenient for her.  She goes out of  town next Tuesday, if we are lucky perhaps the study can be scheduled for Friday or Monday.  If not, it will be when she returns.  2.)  Once I have reviewed her images and confirm the treatment plan, we will then schedule her for pulmonary angiogram and AVM embolization to be performed under general anesthesia at [facility].   Thank you for this interesting consult.  I greatly enjoyed meeting [name] and look forward to participating in their care.  A copy of this report was sent to the requesting provider on this date.  Electronically Signed: Sterling Big 11/20/2022, 12:11 PM   I spent a total of  40 Minutes  in  face to face in clinical consultation, greater than 50% of which was counseling/coordinating care for pulmonary AVM.

## 2022-11-21 ENCOUNTER — Ambulatory Visit
Admission: RE | Admit: 2022-11-21 | Discharge: 2022-11-21 | Disposition: A | Discharge status: Home / Self Care | Payer: Medicare Other | Source: Ambulatory Visit | Attending: Interventional Radiology | Admitting: Interventional Radiology

## 2022-11-21 DIAGNOSIS — Q2572 Congenital pulmonary arteriovenous malformation: Secondary | ICD-10-CM

## 2022-11-21 DIAGNOSIS — J439 Emphysema, unspecified: Secondary | ICD-10-CM | POA: Diagnosis not present

## 2022-11-21 DIAGNOSIS — I700 Atherosclerosis of aorta: Secondary | ICD-10-CM | POA: Diagnosis not present

## 2022-11-21 MED ORDER — IOPAMIDOL (ISOVUE-370) INJECTION 76%
75.0000 mL | Freq: Once | INTRAVENOUS | Status: AC | PRN
  Administered 2022-11-21: 75 mL via INTRAVENOUS

## 2022-11-28 ENCOUNTER — Telehealth: Payer: Self-pay

## 2022-11-28 ENCOUNTER — Other Ambulatory Visit: Payer: Self-pay | Admitting: Interventional Radiology

## 2022-11-28 DIAGNOSIS — Q2730 Arteriovenous malformation, site unspecified: Secondary | ICD-10-CM

## 2022-11-28 NOTE — Telephone Encounter (Signed)
Disregard note.

## 2022-12-03 ENCOUNTER — Ambulatory Visit: Payer: Medicare Other | Admitting: Emergency Medicine

## 2022-12-12 ENCOUNTER — Other Ambulatory Visit: Payer: Self-pay | Admitting: Student

## 2022-12-12 DIAGNOSIS — J4520 Mild intermittent asthma, uncomplicated: Secondary | ICD-10-CM | POA: Diagnosis not present

## 2022-12-12 DIAGNOSIS — Z01812 Encounter for preprocedural laboratory examination: Secondary | ICD-10-CM

## 2022-12-12 NOTE — Progress Notes (Addendum)
Patient for IR Pulmonary angiogram and AVM embolization with general anesthesia on Monday 12/15/2022, I called and spoke with the patient on the phone and gave pre-procedure instructions. Pt was made aware to be here at 7:30a, last dose of Plavix was Tues 12/09/2022,  NPO after MN prior to procedure as well as driver post procedure/recovery/discharge. Pt stated understanding.  Called 12/09/2022 and 12/12/2022  General Anesthesia   Discharge same day per Dr Archer Asa

## 2022-12-14 ENCOUNTER — Encounter: Payer: Self-pay | Admitting: Radiology

## 2022-12-14 NOTE — H&P (Signed)
Chief Complaint: Patient was seen in consultation today for No chief complaint on file.  at the request of Sterling Big  Referring Physician(s): Dr, Delton Coombes  Supervising Physician: Malachy Moan  Patient Status: [facility] - Out-pt  History of Present Illness: [name] is a 77 y.o. female 77 y.o. female outpatient. History of TIA, Migraine, Emphysema, CAD, MI. 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 CT angio Chest PE from 7.19.24 reads table 1.1 cm right lower lobe pulmonary AVM, with solitary 5 mm feeding pulmonary arterial branch. The Patient and her husband was seen in consultation within the IR Radiology Interventional Clinic with IR Attending Dr. Vella Redhead. At that time a detailed discussion regarding the Patient's medical condition including but not limited to possible treatment options took place. Following that discussion the Patient and her husband elected to proceed with pulmonary arteriogram and RLL AVM embolization. The Patient presents today for pulmonary arteriogram and RLL AVM embolization with general anesthesia.    Currently without any significant complaints. Patient alert and laying in bed,calm. Denies any fevers, headache, chest pain, SOB, cough, abdominal pain, nausea, vomiting or bleeding. Return precautions and treatment recommendations and follow-up discussed with the patient  who is agreeable with the plan.    Past Medical History:  Diagnosis Date   Allergy    Arthritis    COPD (chronic obstructive pulmonary disease) (HCC)    Coronary artery disease August 2012   Drug eluting stent mid LAD, drug eluting stent mid RCA   Depression    Emphysema    Heart attack [facility]) August 2012   Migraine    Osteoporosis    TIA (transient ischemic attack) 1995    Past Surgical History:  Procedure Laterality Date    APPENDECTOMY  01/1956   CATARACT EXTRACTION     CORONARY ANGIOPLASTY WITH STENT PLACEMENT  2012   FOOT SURGERY Bilateral    HAND SURGERY Left    IR RADIOLOGIST EVAL & MGMT  11/20/2022   LEFT HEART CATH AND CORONARY ANGIOGRAPHY N/A 12/18/2017   Procedure: LEFT HEART CATH AND CORONARY ANGIOGRAPHY;  Surgeon: Kathleene Hazel, MD;  Location: [facility] INVASIVE CV LAB;  Service: Cardiovascular;  Laterality: N/A;   NASAL SEPTUM SURGERY     SHOULDER SURGERY Left    frozen shoulder   TEE WITHOUT CARDIOVERSION N/A 10/22/2022   Procedure: TRANSESOPHAGEAL ECHOCARDIOGRAM;  Surgeon: Little Ishikawa, MD;  Location: [facility] INVASIVE CV LAB;  Service: Cardiovascular;  Laterality: N/A;   THORACOTOMY     TONSILLECTOMY      Allergies: Naproxen, Parabens, Estradiol, and Tramadol hcl  Medications: Prior to Admission medications   Medication Sig Start Date End Date Taking? Authorizing Provider  acetaminophen (TYLENOL) 500 MG tablet Take 500-1,000 mg by mouth every 6 (six) hours as needed (headache.).    [provider]  albuterol (VENTOLIN HFA) 108 (90 Base) MCG/ACT inhaler Inhale 2 puffs into the lungs every 6 (six) hours as needed for wheezing or shortness of breath. 10/01/22   Byrum, Les Pou, MD  atorvastatin (LIPITOR) 40 MG tablet TAKE 1 TABLET BY MOUTH EVERY DAY Patient taking differently: Take 40 mg by mouth every evening. 10/11/19   Kathleene Hazel, MD  Cholecalciferol (VITAMIN D) 2000 UNITS CAPS Take 4,000 Units by mouth in the morning.    [provider]  clopidogrel (PLAVIX) 75 MG tablet Take 1 tablet (75  mg total) by mouth daily. 09/19/22   Kathleene Hazel, MD  denosumab (PROLIA) 60 MG/ML SOLN injection Inject 60 mg into the skin every 6 (six) months. Administer in upper arm, thigh, or abdomen    [provider]  loratadine (CLARITIN) 10 MG tablet Take 10 mg by mouth in the morning. 02/14/19   [provider]  nitroGLYCERIN (NITROSTAT) 0.4 MG SL  tablet PLACE 1 TABLET (0.4 MG TOTAL) UNDER THE TONGUE EVERY 5 (FIVE) MINUTES AS NEEDED FOR CHEST PAIN. 03/19/22   Kathleene Hazel, MD  potassium chloride SA (KLOR-CON M) 20 MEQ tablet Take 2 tablets (40 mEq total) by mouth daily. 10/31/22   Dunn, Tacey Ruiz, PA-C  sertraline (ZOLOFT) 100 MG tablet Take 50 mg by mouth in the morning. 04/06/16   [provider]  topiramate (TOPAMAX) 100 MG tablet Take 100 mg by mouth every evening.    [provider]  zolpidem (AMBIEN) 10 MG tablet Take 10 mg by mouth at bedtime as needed for sleep.     [provider]     Family History  Problem Relation Age of Onset   Heart disease Paternal Grandmother    Heart disease Paternal Grandfather    Rheum arthritis Maternal Grandmother    Diabetes Maternal Grandmother    Rheum arthritis Maternal Grandfather    Lung cancer Maternal Uncle    Diabetes Mother    Diabetes Maternal Aunt     Social History   Socioeconomic History   Marital status: Married    Spouse name: Not on file   Number of children: 2   Years of education: Not on file   Highest education level: Not on file  Occupational History   Occupation: retired  Tobacco Use   Smoking status: Former    Current packs/day: 0.00    Average packs/day: 1 pack/day for 10.0 years (10.0 ttl pk-yrs)    Types: Cigarettes    Start date: 05/05/1961    Quit date: 05/06/1971    Years since quitting: 51.6   Smokeless tobacco: Never  Vaping Use   Vaping status: Never Used  Substance and Sexual Activity   Alcohol use: No   Drug use: No   Sexual activity: Not on file  Other Topics Concern   Not on file  Social History Narrative   Not on file   Social Determinants of Health   Financial Resource Strain: Not on file  Food Insecurity: Not on file  Transportation Needs: Not on file  Physical Activity: Not on file  Stress: Not on file  Social Connections: Unknown (09/17/2021)   [facility]   Social  Network    Social Network: Not on file     Review of Systems: A 12 point ROS discussed and pertinent positives are indicated in the HPI above.  All other systems are negative.  Review of Systems  Constitutional:  Negative for fatigue and fever.  HENT:  Negative for congestion.   Respiratory:  Negative for cough and shortness of breath.   Gastrointestinal:  Negative for abdominal pain, diarrhea, nausea and vomiting.    Vital Signs: BP (!) 146/85   Pulse 72   Temp 97.6 F (36.4 C) (Oral)   Resp 18   Ht 5\' 2"  (1.575 m)   Wt 110 lb (49.9 kg)   SpO2 92%   BMI 20.12 kg/m   Advance Care Plan: The advanced care plan/surrogate decision maker was discussed at the time of visit and  documented in the medical record.  husband   Physical Exam Vitals and nursing note reviewed.  Constitutional:      Appearance: She is well-developed.  HENT:     Head: Normocephalic and atraumatic.  Eyes:     Conjunctiva/sclera: Conjunctivae normal.  Cardiovascular:     Rate and Rhythm: Normal rate.  Pulmonary:     Effort: Pulmonary effort is normal.  Musculoskeletal:        General: Normal range of motion.     Cervical back: Normal range of motion.  Skin:    General: Skin is warm and dry.  Neurological:     General: No focal deficit present.     Mental Status: She is alert and oriented to person, place, and time.  Psychiatric:        Mood and Affect: Mood normal.        Behavior: Behavior normal.        Thought Content: Thought content normal.        Judgment: Judgment normal.     Imaging: CT Angio Chest Pulmonary Embolism (PE) W or WO Contrast  Result Date: 11/21/2022 CLINICAL DATA:  Pulmonary AVM EXAM: CT ANGIOGRAPHY CHEST WITH CONTRAST TECHNIQUE: Multidetector CT imaging of the chest was performed using the standard protocol during bolus administration of intravenous contrast. Multiplanar CT image reconstructions and MIPs were obtained to evaluate the vascular anatomy. RADIATION DOSE  REDUCTION: This exam was performed according to the departmental dose-optimization program which includes automated exposure control, adjustment of the mA and/or kV according to patient size and/or use of iterative reconstruction technique. CONTRAST:  75mL ISOVUE-370 IOPAMIDOL (ISOVUE-370) INJECTION 76% COMPARISON:  08/15/2022 and previous FINDINGS: Cardiovascular: SVC patent. Heart size normal. No pericardial effusion. The RV is nondilated. Satisfactory opacification of pulmonary arteries noted, and there is no evidence of pulmonary emboli. Medial right lower lobe AVM nidus measuring approximately 1.1 x 0.8 cm. Solitary feeding pulmonary arterial branch measures 4-5 mm diameter. Solitary draining vein into the inferior right pulmonary vein. Right and left coronary calcifications. Adequate contrast opacification of the thoracic aorta with no evidence of dissection, aneurysm, or stenosis. There is classic 3-vessel brachiocephalic arch anatomy without proximal stenosis. Scattered calcified atheromatous plaque in the descending thoracic aorta. Mediastinum/Nodes: No mass or adenopathy. Lungs/Pleura: No pleural effusion. No pneumothorax. Pulmonary emphysema. Chronic biapical pleuroparenchymal scarring relatively stable since 04/11/2009. Stable right Upper Abdomen: Hepatic and left renal cysts.  No acute findings. Musculoskeletal: No chest wall abnormality. No acute or significant osseous findings. Review of the MIP images confirms the above findings. IMPRESSION: 1. Stable 1.1 cm right lower lobe pulmonary AVM, with solitary 5 mm feeding pulmonary arterial branch. 2. Coronary and aortic Atherosclerosis (ICD10-I70.0) 3. Emphysema (ICD10-J43.9). Electronically Signed   By: Corlis Leak M.D.   On: 11/21/2022 13:56   IR Radiologist Eval & Mgmt  Result Date: 11/20/2022 EXAM: NEW PATIENT OFFICE VISIT CHIEF COMPLAINT: SEE EPIC NOTE HISTORY OF PRESENT ILLNESS: SEE EPIC NOTE REVIEW OF SYSTEMS: SEE EPIC NOTE PHYSICAL EXAMINATION:  SEE EPIC NOTE ASSESSMENT AND PLAN: SEE EPIC NOTE Electronically Signed   By: Malachy Moan M.D.   On: 11/20/2022 11:19    Labs:  CBC: Recent Labs    10/20/22 1602 12/15/22 0754  WBC 7.9 6.7  HGB 13.6 14.3  HCT 42.2 44.8  PLT 324 283    COAGS: No results for input(s): "INR", "APTT" in the last 8760 hours.  BMP: Recent Labs    10/20/22 1602 10/30/22 1512 11/10/22 1214  NA 140  140 139  K 3.5 3.9 4.1  CL 107* 106 105  CO2 22 21 20   GLUCOSE 78 70 86  BUN 15 17 19   CALCIUM 9.4 9.2 9.6  CREATININE 0.82 0.97 0.88   Assessment and Plan:  77 y.o. female outpatient. History of TIA, Migraine, Emphysema, CAD, MI. 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 The Patient and her husband was seen in consultation within the IR Radiology Interventional Clinic with IR Attending Dr. Vella Redhead. At that time a detailed discussion regarding the Patient's medical condition including but not limited to possible treatment options took place. Following that discussion the Patient and her husband elected to proceed with pulmonary arteriogram and RLL AVM embolization The Patient presents today for pulmonary arteriogram and RLL AVM embolization with general anesthesia.   Patient is on Plavix. Last dose on Tuesday. Labs pending. Al other medications are within acceptable parameters. Allergies include NSAID. Patient has been NPO since midnight  The Risks and benefits of embolization were discussed with the patient including, but not limited to bleeding, infection, vascular injury, post operative pain, or contrast induced renal failure.  This procedure involves the use of X-rays and because of the nature of the planned  procedure, it is possible that we will have prolonged use of X-ray fluoroscopy.  Potential radiation risks to you include (but are not limited to) the following: - A slightly elevated risk for cancer several years later in life. This risk is typically less than 0.5% percent. This risk is low in comparison to the normal incidence of human cancer, which is 33% for women and 50% for men according to the [employer] Society. - Radiation induced injury can include skin redness, resembling a rash, tissue breakdown / ulcers and hair loss (which can be temporary or permanent).   The likelihood of either of these occurring depends on the difficulty of the procedure and whether you are sensitive to radiation due to previous procedures, disease, or genetic conditions.   IF your procedure requires a prolonged use of radiation, you will be notified and given written instructions for further action.  It is your responsibility to monitor the irradiated area for the 2 weeks following the procedure and to notify your physician if you are concerned that you have suffered a radiation induced injury.    All of the patient's questions were answered, patient is agreeable to proceed. Consent signed and in chart.   Thank you for this interesting consult.  I greatly enjoyed meeting [name] and look forward to participating in their care.  A copy of this report was sent to the requesting provider on this date.  Electronically Signed: Alene Mires, NP 12/15/2022, 8:07 AM   I spent a total of    15 Minutes in face to face in clinical consultation, greater than 50% of which was counseling/coordinating care for RLL Pulmonary AVM embolization

## 2022-12-15 ENCOUNTER — Other Ambulatory Visit: Payer: Self-pay

## 2022-12-15 ENCOUNTER — Encounter: Payer: Self-pay | Admitting: Radiology

## 2022-12-15 ENCOUNTER — Other Ambulatory Visit: Payer: Self-pay | Admitting: Radiology

## 2022-12-15 ENCOUNTER — Encounter: Payer: Self-pay | Admitting: Anesthesiology

## 2022-12-15 ENCOUNTER — Ambulatory Visit
Admission: RE | Admit: 2022-12-15 | Discharge: 2022-12-15 | Disposition: A | Discharge status: Home / Self Care | Payer: Medicare Other | Source: Ambulatory Visit | Attending: Interventional Radiology | Admitting: Interventional Radiology

## 2022-12-15 DIAGNOSIS — Q2572 Congenital pulmonary arteriovenous malformation: Secondary | ICD-10-CM

## 2022-12-15 DIAGNOSIS — Q2730 Arteriovenous malformation, site unspecified: Secondary | ICD-10-CM

## 2022-12-15 DIAGNOSIS — Z8673 Personal history of transient ischemic attack (TIA), and cerebral infarction without residual deficits: Secondary | ICD-10-CM | POA: Insufficient documentation

## 2022-12-15 DIAGNOSIS — J439 Emphysema, unspecified: Secondary | ICD-10-CM | POA: Diagnosis not present

## 2022-12-15 DIAGNOSIS — I252 Old myocardial infarction: Secondary | ICD-10-CM | POA: Insufficient documentation

## 2022-12-15 DIAGNOSIS — I2510 Atherosclerotic heart disease of native coronary artery without angina pectoris: Secondary | ICD-10-CM | POA: Diagnosis not present

## 2022-12-15 DIAGNOSIS — J9601 Acute respiratory failure with hypoxia: Secondary | ICD-10-CM | POA: Diagnosis not present

## 2022-12-15 DIAGNOSIS — Z87891 Personal history of nicotine dependence: Secondary | ICD-10-CM | POA: Diagnosis not present

## 2022-12-15 DIAGNOSIS — Z955 Presence of coronary angioplasty implant and graft: Secondary | ICD-10-CM | POA: Diagnosis not present

## 2022-12-15 DIAGNOSIS — R0902 Hypoxemia: Secondary | ICD-10-CM | POA: Diagnosis not present

## 2022-12-15 DIAGNOSIS — Z7902 Long term (current) use of antithrombotics/antiplatelets: Secondary | ICD-10-CM | POA: Insufficient documentation

## 2022-12-15 DIAGNOSIS — Z01812 Encounter for preprocedural laboratory examination: Secondary | ICD-10-CM

## 2022-12-15 HISTORY — PX: IR EMBO VENOUS NOT HEMORR HEMANG  INC GUIDE ROADMAPPING: IMG5447

## 2022-12-15 LAB — CBC
HCT: 44.8 % (ref 36.0–46.0)
Hemoglobin: 14.3 g/dL (ref 12.0–15.0)
MCH: 28.6 pg (ref 26.0–34.0)
MCHC: 31.9 g/dL (ref 30.0–36.0)
MCV: 89.6 fL (ref 80.0–100.0)
Platelets: 283 10*3/uL (ref 150–400)
RBC: 5 MIL/uL (ref 3.87–5.11)
RDW: 14.7 % (ref 11.5–15.5)
WBC: 6.7 10*3/uL (ref 4.0–10.5)
nRBC: 0 % (ref 0.0–0.2)

## 2022-12-15 LAB — BASIC METABOLIC PANEL
Anion gap: 10 (ref 5–15)
BUN: 18 mg/dL (ref 8–23)
CO2: 21 mmol/L — ABNORMAL LOW (ref 22–32)
Calcium: 9 mg/dL (ref 8.9–10.3)
Chloride: 110 mmol/L (ref 98–111)
Creatinine, Ser: 0.82 mg/dL (ref 0.44–1.00)
GFR, Estimated: >60 mL/min (ref >60)
Glucose, Bld: 93 mg/dL (ref 70–99)
Potassium: 3.5 mmol/L (ref 3.5–5.1)
Sodium: 141 mmol/L (ref 135–145)

## 2022-12-15 LAB — PROTIME-INR
INR: 1.1 (ref 0.8–1.2)
Prothrombin Time: 14 seconds (ref 11.4–15.2)

## 2022-12-15 MED ORDER — EPHEDRINE SULFATE (PRESSORS) 50 MG/ML IJ SOLN
INTRAMUSCULAR | Status: DC | PRN
  Administered 2022-12-15: 10 mg via INTRAVENOUS

## 2022-12-15 MED ORDER — CEFAZOLIN SODIUM-DEXTROSE 2-4 GM/100ML-% IV SOLN
2.0000 g | Freq: Once | INTRAVENOUS | Status: AC
  Administered 2022-12-15: 2 g via INTRAVENOUS

## 2022-12-15 MED ORDER — LIDOCAINE HCL 1 % IJ SOLN
1.0000 mL | Freq: Once | INTRAMUSCULAR | Status: AC
  Administered 2022-12-15: 1 mL via INTRADERMAL

## 2022-12-15 MED ORDER — PROPOFOL 10 MG/ML IV BOLUS
INTRAVENOUS | Status: AC
  Filled 2022-12-15: qty 40

## 2022-12-15 MED ORDER — FENTANYL CITRATE (PF) 100 MCG/2ML IJ SOLN
INTRAMUSCULAR | Status: DC | PRN
  Administered 2022-12-15: 50 ug via INTRAVENOUS

## 2022-12-15 MED ORDER — ROCURONIUM BROMIDE 10 MG/ML (PF) SYRINGE
PREFILLED_SYRINGE | INTRAVENOUS | Status: AC
  Filled 2022-12-15: qty 10

## 2022-12-15 MED ORDER — SUGAMMADEX SODIUM 200 MG/2ML IV SOLN
INTRAVENOUS | Status: DC | PRN
  Administered 2022-12-15: 200 mg via INTRAVENOUS

## 2022-12-15 MED ORDER — PROPOFOL 10 MG/ML IV BOLUS
INTRAVENOUS | Status: DC | PRN
Start: 2022-12-15 — End: 2022-12-15
  Administered 2022-12-15: 110 mg via INTRAVENOUS

## 2022-12-15 MED ORDER — FENTANYL CITRATE (PF) 100 MCG/2ML IJ SOLN
INTRAMUSCULAR | Status: AC
  Filled 2022-12-15: qty 2

## 2022-12-15 MED ORDER — DEXAMETHASONE SODIUM PHOSPHATE 10 MG/ML IJ SOLN
INTRAMUSCULAR | Status: DC | PRN
  Administered 2022-12-15: 10 mg via INTRAVENOUS

## 2022-12-15 MED ORDER — PHENYLEPHRINE HCL-NACL 20-0.9 MG/250ML-% IV SOLN
INTRAVENOUS | Status: DC | PRN
  Administered 2022-12-15: 30 ug/min via INTRAVENOUS

## 2022-12-15 MED ORDER — HEPARIN SODIUM (PORCINE) 1000 UNIT/ML IJ SOLN
INTRAMUSCULAR | Status: DC | PRN
Start: 2022-12-15 — End: 2022-12-15
  Administered 2022-12-15: 3000 [IU] via INTRAVENOUS

## 2022-12-15 MED ORDER — IOHEXOL 300 MG/ML  SOLN
92.0000 mL | Freq: Once | INTRAMUSCULAR | Status: AC | PRN
  Administered 2022-12-15: 92 mL via INTRAVENOUS

## 2022-12-15 MED ORDER — SODIUM CHLORIDE 0.9 % IV SOLN: INTRAVENOUS | Status: DC

## 2022-12-15 MED ORDER — PHENYLEPHRINE 80 MCG/ML (10ML) SYRINGE FOR IV PUSH (FOR BLOOD PRESSURE SUPPORT)
PREFILLED_SYRINGE | INTRAVENOUS | Status: DC | PRN
  Administered 2022-12-15 (×2): 160 ug via INTRAVENOUS
  Administered 2022-12-15: 80 ug via INTRAVENOUS

## 2022-12-15 MED ORDER — LIDOCAINE HCL (CARDIAC) PF 100 MG/5ML IV SOSY
PREFILLED_SYRINGE | INTRAVENOUS | Status: DC | PRN
  Administered 2022-12-15: 80 mg via INTRAVENOUS

## 2022-12-15 MED ORDER — ROCURONIUM BROMIDE 100 MG/10ML IV SOLN
INTRAVENOUS | Status: DC | PRN
  Administered 2022-12-15: 40 mg via INTRAVENOUS
  Administered 2022-12-15: 10 mg via INTRAVENOUS

## 2022-12-15 MED ORDER — CEFAZOLIN SODIUM-DEXTROSE 2-4 GM/100ML-% IV SOLN
INTRAVENOUS | Status: AC
  Filled 2022-12-15: qty 100

## 2022-12-15 MED ORDER — ONDANSETRON HCL 4 MG/2ML IJ SOLN
INTRAMUSCULAR | Status: DC | PRN
  Administered 2022-12-15: 4 mg via INTRAVENOUS

## 2022-12-15 MED ORDER — PHENYLEPHRINE HCL-NACL 20-0.9 MG/250ML-% IV SOLN
INTRAVENOUS | Status: AC
  Filled 2022-12-15: qty 250

## 2022-12-15 MED ORDER — LIDOCAINE HCL 1 % IJ SOLN
INTRAMUSCULAR | Status: AC
  Filled 2022-12-15: qty 20

## 2022-12-15 MED ORDER — LIDOCAINE HCL (PF) 2 % IJ SOLN
INTRAMUSCULAR | Status: AC
  Filled 2022-12-15: qty 5

## 2022-12-15 NOTE — Anesthesia Postprocedure Evaluation (Signed)
Anesthesia Post Note  Patient: [name]  Procedure(s) Performed: IR EMBO VENOUS NOT HEMORR HEMANG  INC GUIDE ROADMAPPING  Patient location during evaluation: PACU Anesthesia Type: General Level of consciousness: awake and alert Pain management: pain level controlled Vital Signs Assessment: post-procedure vital signs reviewed and stable Respiratory status: spontaneous breathing, nonlabored ventilation and respiratory function stable Cardiovascular status: blood pressure returned to baseline and stable Postop Assessment: no apparent nausea or vomiting Anesthetic complications: no   No notable events documented.   Last Vitals:  Vitals:   12/15/22 1145 12/15/22 1200  BP: (!) 111/58 (!) 107/58  Pulse: 80 83  Resp: 18 16  Temp:    SpO2: 97% 96%    Last Pain:  Vitals:   12/15/22 1200  TempSrc:   PainSc: 0-No pain                 Foye Deer

## 2022-12-15 NOTE — Anesthesia Procedure Notes (Signed)
Procedure Name: Intubation Date/Time: 12/15/2022 8:44 AM  Performed by: Katherine Basset, CRNAPre-anesthesia Checklist: Patient identified, Emergency Drugs available, Suction available and Patient being monitored Patient Re-evaluated:Patient Re-evaluated prior to induction Oxygen Delivery Method: Circle system utilized Preoxygenation: Pre-oxygenation with 100% oxygen Induction Type: IV induction Ventilation: Mask ventilation without difficulty Laryngoscope Size: Miller and 2 Grade View: Grade I Tube type: Oral Tube size: 7.0 mm Number of attempts: 1 Airway Equipment and Method: Stylet, Oral airway and Bite block Placement Confirmation: ETT inserted through vocal cords under direct vision, positive ETCO2 and breath sounds checked- equal and bilateral Secured at: 21 cm Tube secured with: Tape Dental Injury: Teeth and Oropharynx as per pre-operative assessment

## 2022-12-15 NOTE — Procedures (Signed)
Interventional Radiology Procedure Note  Procedure: Embolization of pulmonary AVM  Complications: None  Estimated Blood Loss: None  Recommendations: - Bedrest x 2 hrs - DC home   Signed,  Sterling Big, MD

## 2022-12-15 NOTE — Transfer of Care (Signed)
Immediate Anesthesia Transfer of Care Note  Patient: [name]  Procedure(s) Performed: IR EMBO VENOUS NOT HEMORR HEMANG  INC GUIDE ROADMAPPING  Patient Location: PACU  Anesthesia Type:General  Level of Consciousness: awake, alert , and oriented  Airway & Oxygen Therapy: Patient Spontanous Breathing and Patient connected to nasal cannula oxygen  Post-op Assessment: Report given to RN, Post -op Vital signs reviewed and stable, and Patient moving all extremities  Post vital signs: Reviewed and stable  Last Vitals:  Vitals Value Taken Time  BP 105/55 12/15/22 1015  Temp 36.2 C 12/15/22 1011  Pulse 81 12/15/22 1016  Resp 19 12/15/22 1016  SpO2 100 % 12/15/22 1016  Vitals shown include unfiled device data.  Last Pain:  Vitals:   12/15/22 0751  TempSrc: Oral  PainSc: 0-No pain         Complications: No notable events documented.

## 2022-12-15 NOTE — Anesthesia Preprocedure Evaluation (Addendum)
Anesthesia Evaluation  Patient identified by MRN, date of birth, ID band Patient awake    Reviewed: Allergy & Precautions, H&P , NPO status , Patient's Chart, lab work & pertinent test results  Airway Mallampati: II  TM Distance: >3 FB Neck ROM: full    Dental  (+) Implants   Pulmonary shortness of breath, with exertion and Long-Term Oxygen Therapy, asthma , COPD, former smoker Recent onset hypoxemic respiratory failure 3L home O2 with exertion  RLL AVM  H/o SPONTANEOUS PNEUMOTHORAX   Pulmonary exam normal        Cardiovascular + CAD, + Past MI and + Cardiac Stents (2012 DES mid LAD and mid RCA)  Normal cardiovascular exam+ dysrhythmias (PACs, PVCs, SVT - one PAC noted on EKG. Asymptomatic)   ECHO TEE: IMPRESSIONS  Markedly positive bubble study. No ASD seen. Repeating bubble study  while visualizing right upper pulmonary vein shows bubbles coming into  left atrium through pulmonary vein. Had prior CT chest showing connection  between right pulmonary artery and  pulmonary vein, suspect this is the source of patient's right to left  shunt    1. Left ventricular ejection fraction, by estimation, is 60 to 65%. The  left ventricle has normal function. The left ventricle has no regional  wall motion abnormalities.   2. Right ventricular systolic function is normal. The right ventricular  size is normal.   3. No left atrial/left atrial appendage thrombus was detected.   4. The mitral valve is normal in structure. Trivial mitral valve  regurgitation.   5. The aortic valve is tricuspid. Aortic valve regurgitation is not  visualized. No aortic stenosis is present.   6. Agitated saline contrast bubble study was positive with shunting  observed within 3-6 cardiac cycles.   MPS 2019:  Nuclear stress EF: 78%. No wall motion abnormalities.  There was no ST segment deviation noted during stress.  Defect 1: There is a small defect of  mild severity present in the basal anterolateral location. There is an apical anterior defect seen at rest, not stress.  Overall low risk nuclear stress test with no areas of significant ischemia identified. 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 Pt here for pulmonary arteriogram and RLL AVM embolization.  Past Medical History: No date: Allergy No date: Arthritis No date: COPD (chronic obstructive pulmonary disease) (HCC) August 2012: Coronary artery disease     Comment:  Drug eluting stent mid LAD, drug eluting stent mid RCA No date: Depression No date: Emphysema August 2012: Heart attack (HCC) No date: Migraine No date: Osteoporosis 1995: TIA (transient ischemic attack)  Past Surgical History: 01/1956: APPENDECTOMY No date: CATARACT EXTRACTION 2012: CORONARY ANGIOPLASTY WITH STENT PLACEMENT No date: FOOT SURGERY; Bilateral No date: HAND SURGERY; Left 11/20/2022: IR RADIOLOGIST EVAL & MGMT 12/18/2017: LEFT HEART CATH AND CORONARY ANGIOGRAPHY; N/A     Comment:  Procedure: LEFT HEART CATH AND CORONARY ANGIOGRAPHY;                Surgeon: Verne Carrow  D, MD;  Location: [facility]               INVASIVE CV LAB;  Service: Cardiovascular;  Laterality:               N/A; No date: NASAL SEPTUM SURGERY No date: SHOULDER SURGERY; Left     Comment:  frozen shoulder 10/22/2022: TEE WITHOUT CARDIOVERSION; N/A     Comment:  Procedure: TRANSESOPHAGEAL ECHOCARDIOGRAM;  Surgeon:               Little Ishikawa, MD;  Location: [facility] INVASIVE CV                LAB;  Service: Cardiovascular;  Laterality: N/A; No date: THORACOTOMY No date: TONSILLECTOMY     Reproductive/Obstetrics negative OB ROS                              Anesthesia Physical Anesthesia Plan  ASA: 3  Anesthesia Plan: General   Post-op Pain Management: Minimal or no pain anticipated   Induction: Intravenous  PONV Risk Score and Plan: 2 and Ondansetron, Dexamethasone and Propofol infusion  Airway Management Planned: Oral ETT  Additional Equipment:   Intra-op Plan:   Post-operative Plan: Extubation in OR  Informed Consent: I have reviewed the patients History and Physical, chart, labs and discussed the procedure including the risks, benefits and alternatives for the proposed anesthesia with the patient or authorized representative who has indicated his/her understanding and acceptance.     Dental Advisory Given  Plan Discussed with: CRNA and Surgeon  Anesthesia Plan Comments:          Anesthesia Quick Evaluation

## 2022-12-22 DIAGNOSIS — M810 Age-related osteoporosis without current pathological fracture: Secondary | ICD-10-CM | POA: Diagnosis not present

## 2022-12-22 DIAGNOSIS — I2510 Atherosclerotic heart disease of native coronary artery without angina pectoris: Secondary | ICD-10-CM | POA: Diagnosis not present

## 2022-12-25 DIAGNOSIS — M810 Age-related osteoporosis without current pathological fracture: Secondary | ICD-10-CM | POA: Diagnosis not present

## 2023-01-12 DIAGNOSIS — J4520 Mild intermittent asthma, uncomplicated: Secondary | ICD-10-CM | POA: Diagnosis not present

## 2023-01-13 ENCOUNTER — Ambulatory Visit
Admission: RE | Admit: 2023-01-13 | Discharge: 2023-01-13 | Disposition: A | Discharge status: Home / Self Care | Payer: Medicare Other | Source: Ambulatory Visit | Attending: Radiology | Admitting: Radiology

## 2023-01-13 DIAGNOSIS — Q2572 Congenital pulmonary arteriovenous malformation: Secondary | ICD-10-CM | POA: Diagnosis not present

## 2023-01-13 HISTORY — PX: IR RADIOLOGIST EVAL & MGMT: IMG5224

## 2023-01-13 NOTE — Progress Notes (Signed)
Chief Complaint: Patient was consulted remotely today (TeleHealth) for pulmonary AVM at the request of Omohundro,Jennifer C.    Referring Physician(s): Omohundro,Jennifer C  History of Present Illness: [name] is a 77 y.o. 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  She underwent embolization utilizing a single microvascular plug on 12/15/2022.  We spoke over the phone today for her 1 month follow-up evaluation.  She states that she is feeling great.  She is no longer using home oxygen.  On room air, her oxygen saturation is now consistently in the high 90s when it was in the 80s previously.  Her shortness of breath and dyspnea are significantly improved.  She has been able to resume a more normal level of activity.  She reports that her right groin access site is completely healed and she has no pain or other issues at this site.  Overall, she could not be more pleased with how things have gone.  Past Medical History:  Diagnosis Date   Allergy    Arthritis    COPD (chronic obstructive pulmonary disease) (HCC)    Coronary artery disease August 2012   Drug eluting stent mid LAD, drug eluting stent mid RCA   Depression    Emphysema    Heart attack [facility]) August 2012   Migraine    Osteoporosis    TIA (transient ischemic attack) 1995    Past Surgical History:  Procedure Laterality Date   APPENDECTOMY  01/1956   CATARACT EXTRACTION     CORONARY ANGIOPLASTY WITH STENT PLACEMENT  2012   FOOT SURGERY Bilateral    HAND SURGERY Left    IR EMBO VENOUS NOT HEMORR HEMANG  INC GUIDE ROADMAPPING  12/15/2022   IR RADIOLOGIST EVAL & MGMT  11/20/2022   LEFT HEART CATH AND CORONARY ANGIOGRAPHY N/A 12/18/2017   Procedure: LEFT HEART CATH AND CORONARY ANGIOGRAPHY;  Surgeon: Kathleene Hazel, MD;  Location: [facility] INVASIVE CV LAB;  Service: Cardiovascular;  Laterality: N/A;   NASAL SEPTUM  SURGERY     SHOULDER SURGERY Left    frozen shoulder   TEE WITHOUT CARDIOVERSION N/A 10/22/2022   Procedure: TRANSESOPHAGEAL ECHOCARDIOGRAM;  Surgeon: Little Ishikawa, MD;  Location: [facility] INVASIVE CV LAB;  Service: Cardiovascular;  Laterality: N/A;   THORACOTOMY     TONSILLECTOMY      Allergies: Naproxen, Parabens, Estradiol, and Tramadol hcl  Medications: Prior to Admission medications   Medication Sig Start Date End Date Taking? Authorizing Provider  acetaminophen (TYLENOL) 500 MG tablet Take 500-1,000 mg by mouth every 6 (six) hours as needed (headache.).    [provider]  albuterol (VENTOLIN HFA) 108 (90 Base) MCG/ACT inhaler Inhale 2 puffs into the lungs every 6 (six) hours as needed for wheezing or shortness of breath. 10/01/22   Byrum, Les Pou, MD  atorvastatin (LIPITOR) 40 MG tablet TAKE 1 TABLET BY MOUTH EVERY DAY Patient taking differently: Take 40 mg by mouth every evening. 10/11/19   Kathleene Hazel, MD  Cholecalciferol (VITAMIN D) 2000 UNITS CAPS Take 4,000 Units by mouth in the morning.    [provider]  clopidogrel (PLAVIX) 75 MG tablet Take 1 tablet (75 mg total) by mouth daily. 09/19/22   Kathleene Hazel, MD  denosumab (PROLIA) 60 MG/ML SOLN injection Inject 60 mg into the skin every 6 (six) months. Administer in upper arm, thigh, or abdomen    [provider]  loratadine (CLARITIN)  10 MG tablet Take 10 mg by mouth in the morning. 02/14/19   [provider]  nitroGLYCERIN (NITROSTAT) 0.4 MG SL tablet PLACE 1 TABLET (0.4 MG TOTAL) UNDER THE TONGUE EVERY 5 (FIVE) MINUTES AS NEEDED FOR CHEST PAIN. 03/19/22   Kathleene Hazel, MD  potassium chloride SA (KLOR-CON M) 20 MEQ tablet Take 2 tablets (40 mEq total) by mouth daily. 10/31/22   Dunn, Tacey Ruiz, PA-C  sertraline (ZOLOFT) 100 MG tablet Take 50 mg by mouth in the morning. 04/06/16   [provider]  topiramate (TOPAMAX) 100 MG tablet Take 100 mg by mouth  every evening.    [provider]  zolpidem (AMBIEN) 10 MG tablet Take 10 mg by mouth at bedtime as needed for sleep.     [provider]     Family History  Problem Relation Age of Onset   Heart disease Paternal Grandmother    Heart disease Paternal Grandfather    Rheum arthritis Maternal Grandmother    Diabetes Maternal Grandmother    Rheum arthritis Maternal Grandfather    Lung cancer Maternal Uncle    Diabetes Mother    Diabetes Maternal Aunt     Social History   Socioeconomic History   Marital status: Married    Spouse name: Not on file   Number of children: 2   Years of education: Not on file   Highest education level: Not on file  Occupational History   Occupation: retired  Tobacco Use   Smoking status: Former    Current packs/day: 0.00    Average packs/day: 1 pack/day for 10.0 years (10.0 ttl pk-yrs)    Types: Cigarettes    Start date: 05/05/1961    Quit date: 05/06/1971    Years since quitting: 51.7   Smokeless tobacco: Never  Vaping Use   Vaping status: Never Used  Substance and Sexual Activity   Alcohol use: No   Drug use: No   Sexual activity: Not on file  Other Topics Concern   Not on file  Social History Narrative   Not on file   Social Determinants of Health   Financial Resource Strain: Not on file  Food Insecurity: Not on file  Transportation Needs: Not on file  Physical Activity: Not on file  Stress: Not on file  Social Connections: Unknown (09/17/2021)   Received from [facility]   Social Network    Social Network: Not on file    Review of Systems  Review of Systems: A 12 point ROS discussed and pertinent positives are indicated in the HPI above.  All other systems are negative.  Advance Care Plan: The advanced care plan/surrogate decision maker was discussed at the time of visit and documented in the medical record.    Physical Exam No direct physical exam was performed (except for noted visual exam  findings with Video Visits).    Vital Signs: There were no vitals taken for this visit.  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 The feeding artery ranges in size from 5-6 mm which poses a risk for paradoxical embolization. Additionally, patient is suffering from new onset hypoxic respiratory failure without other identified source. She presents for embolization of this arteriovenous malformation. EXAM: IR EMBO VENOUS NOT HEMORR HEMANG  INC GUIDE ROADMAPPING COMPARISON:  None Available. MEDICATIONS: None. ANESTHESIA/SEDATION: General anesthesia provided by the anesthesiology service. FLUOROSCOPY  TIME:  Radiation exposure index: 168 mGy air kerma COMPLICATIONS: None immediate. TECHNIQUE: Informed written consent was obtained from the patient after a thorough discussion of the procedural risks, benefits and alternatives. All questions were addressed. Maximal Sterile Barrier Technique was utilized including caps, mask, sterile gowns, sterile gloves, sterile drape, hand hygiene and skin antiseptic. A timeout was performed prior to the initiation of the procedure. The right common femoral vein was interrogated with ultrasound and found to be widely patent. An image was obtained and stored for the medical record. Local anesthesia was attained by infiltration with 1% lidocaine. A small dermatotomy was made. Under real-time sonographic guidance, the vessel was punctured with a 21 gauge micropuncture needle. Using standard technique, the initial micro needle was exchanged over a 0.018 micro wire for a transitional 4 [place] micro sheath. The micro sheath was then exchanged over a 0.035 wire for a 70 cm 5 French hydrophilic sheath. The sheath was advanced over the wire into the intrahepatic inferior vena cava. A 5 French angled pigtail catheter was then advanced through  the sheath over a wire and used to navigate through the right heart and into the main pulmonary outflow tract. A pulmonary arteriogram was performed. Large right lower lobe pulmonary AVM readily identified. There is significant flow through the arteriovenous malformation. The pigtail catheter was further advanced out into the right main pulmonary artery. A right pulmonary arteriogram was performed in multiple obliquities. The right lower lobe pulmonary AVM was further evaluated. There is a single inflow vessel ranging in size from 4.8-5.8 mm. The artery transitions almost immediately into the draining outflow vein. No significant nidus. The pigtail catheter was exchanged over an Amplatz wire. The 5 French sheath was advanced over the wire and into the right main pulmonary artery. A 100 cm 5 [place] vert catheter was then advanced over a Glidewire and used to carefully selected the right lower lobe segmental pulmonary artery branch leading to the pulmonary AVM. Arteriography was performed confirming that the catheter was indeed in the distal AVM. Measurements were again obtained confirming vessel size. Ultimately, a Medtronic MVP 7 device was selected. The device was successfully deployed in the feeding artery. Follow-up arteriography demonstrates cessation of flow. The device was then detached. This was performed under real-time fluoroscopy. No distal migration of the device. The 5 French catheter was removed. Pulmonary arteriography from the right main pulmonary vein performed through the 5 French sheath. Confirmed complete occlusion of the pulmonary AVM with no further filling of the abnormality. The catheter was removed. Hemostasis was attained with manual pressure. FINDINGS: Moderately large simple pulmonary arteriovenous malformation arising from a segmental branch of the right lower lobe pulmonary artery. The feeding artery ranges in size from 4.8-5.8 mm. IMPRESSION: Successful embolization of right lower lobe  pulmonary AVM utilizing a single MVP 7 device. Electronically Signed   By: Malachy Moan M.D.   On: 12/15/2022 13:18    Labs:  CBC: Recent Labs    10/20/22 1602 12/15/22 0754  WBC 7.9 6.7  HGB 13.6 14.3  HCT 42.2 44.8  PLT 324 283    COAGS: Recent Labs    12/15/22 0754  INR 1.1    BMP: Recent Labs    10/20/22 1602 10/30/22 1512 11/10/22 1214 12/15/22 0754  NA 140 140 139 141  K 3.5 3.9 4.1 3.5  CL 107* 106 105 110  CO2 22 21 20  21*  GLUCOSE 78 70 86 93  BUN 15 17 19 18   CALCIUM 9.4 9.2  9.6 9.0  CREATININE 0.82 0.97 0.88 0.82  GFRNONAA  --   --   --  >60    LIVER FUNCTION TESTS: No results for input(s): "BILITOT", "AST", "ALT", "ALKPHOS", "PROT", "ALBUMIN" in the last 8760 hours.  TUMOR MARKERS: No results for input(s): "AFPTM", "CEA", "CA199", "CHROMGRNA" in the last 8760 hours.  Assessment and Plan:  Extremely pleasant 77 year old female with a history of right lower lobe pulmonary arteriovenous malformation and new onset hypoxic respiratory failure.  She is now 1 month status post endovascular embolization of the pulmonary arteriovenous malformation and is doing incredibly well.  Hypoxia and dyspnea on exertion are resolved.  She is feeling great.  We will assess her pulmonary AVM at 6 months post procedure with CT arteriogram.  I could not be happier for her and her amazing result.  1.) CTA CHEST PE PROTOCOL in February 2025 followed by clinic visit.      Electronically Signed: Sterling Big 01/13/2023, 12:09 PM   I spent a total of 15 Minutes in remote  clinical consultation, greater than 50% of which was counseling/coordinating care for pulmonary AVM.    Visit type: Audio only (telephone). Audio (no video) only due to patient preference. Alternative for in-person consultation at [facility], [address] [place], [place], [place]. This visit type was conducted due to national recommendations for restrictions regarding the COVID-19  Pandemic (e.g. social distancing).  This format is felt to be most appropriate for this patient at this time.  All issues noted in this document were discussed and addressed.

## 2023-01-14 ENCOUNTER — Ambulatory Visit: Payer: Medicare Other | Admitting: Emergency Medicine

## 2023-01-14 ENCOUNTER — Encounter: Payer: Self-pay | Admitting: Emergency Medicine

## 2023-01-14 VITALS — BP 108/62 | HR 76 | Temp 97.7°F | Ht 62.5 in | Wt 111.6 lb

## 2023-01-14 DIAGNOSIS — Q2572 Congenital pulmonary arteriovenous malformation: Secondary | ICD-10-CM | POA: Diagnosis not present

## 2023-01-14 DIAGNOSIS — R0600 Dyspnea, unspecified: Secondary | ICD-10-CM

## 2023-01-14 DIAGNOSIS — J4520 Mild intermittent asthma, uncomplicated: Secondary | ICD-10-CM | POA: Diagnosis not present

## 2023-01-14 NOTE — Progress Notes (Signed)
Subjective:    Patient ID: [name], female    DOB: 04/25/1946, 77 y.o.   MRN: [id number]  HPI Comments:   ROV 10/01/22 --follow-up visit for 77 year old woman with mild intermittent asthma, rhinitis and chronic cough.  We have been evaluating her most recently for progressive exertional dyspnea and exertional hypoxemia that have been out of proportion to her asthma.  Initial echocardiogram showed normal RV size and function.  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  This prompted me to obtain an echo bubble study which was done on 09/24/2022  Echocardiogram with bubble study 09/24/2022 report reviewed by me showed a positive saline study with agitated bubbles evident at 3-6 cardiac cycles.  Consider intra-atrial shunting, ASD.  ROV 11/19/22 --follow-up visit 77 year old woman with dyspnea.  She has mild intermittent asthma, chronic rhinitis with some associated chronic cough.  We have been evaluating hypoxemia and dyspnea on proportion to her obstructive lung disease.  She has normal RV size and function but bubble study was positive for shunting.  Her TEE was done 10/22/2022.  There was no ASD.  They were able to visualize the right upper pulmonary vein that showed bubbles coming into the left atrium through the pulmonary vein which appears to be the source of her shunt.  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 She is using 2.5L/min w exertion.  Her functional capacity is much improved, she is not feeling SOB. She is off O2.  Planning for a repeat CT chest in 6 months and follow-up with Dr. Archer Asa in interventional radiology. On loratadine, has albuterol but never needs it.     Objective:   Physical Exam Vitals:   01/14/23 0844  BP: 108/62  Pulse: 76  Temp: 97.7 F (36.5 C)  SpO2: 98%     Gen: Pleasant, thin, in no distress,  normal affect  ENT: No lesions,  mouth clear,  oropharynx  clear, no postnasal drip  Neck: No JVD, no stridor  Lungs: No use of accessory muscles, clear without rales or rhonchi  Cardiovascular: RRR, heart sounds normal, no murmur or gallops, no peripheral edema  Musculoskeletal: No deformities, no cyanosis or clubbing  Neuro: alert, non focal  Skin: Warm, no lesions or rash     Assessment & Plan:  Pulmonary arteriovenous malformation She had significant dyspnea and also hypoxemia out of proportion to her known obstructive lung disease.  Since she has gone for embolization and closure with IR she is remarkably improved.  She is interested in trying to come off of oxygen and we will walk her today to see if she desaturates.  I am so glad that you are feeling better after your IR procedure. We will perform a walking oximetry today to ensure that you do not have low oxygen levels.  If not then we will send an order for you to come off of supplemental oxygen. Follow with Dr. Delton Coombes in 12 months or sooner if you have any problems.   Mild intermittent asthma Mild intermittent asthma, well-controlled at this time.  Continue same plan.  Albuterol as needed         Levy Pupa, MD, PhD 01/22/2023, 2:57 PM [facility] [phone] or if no answer [phone]

## 2023-01-14 NOTE — Progress Notes (Signed)
  Subjective:    Patient ID: [name], female    DOB: Sep 13, 1945, 77 y.o.   MRN: [id number]  HPI Comments:   ROV 08/21/22 --[name] is 14 with history of mild intermittent asthma, chronic cough and chronic rhinitis.  She has been experiencing progressive dyspnea and exertional hypoxemia that seem to be out of a portion to her obstructive lung disease.  Echocardiogram showed normal RV size and function but the tricuspid regurgitation signal was inadequate for assessing her PA pressures.  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  She has some chronic posterior biapical bandlike scarring and some varicoid bronchiectatic change and volume loss.  ROV 10/01/22 --follow-up visit for 77 year old woman with mild intermittent asthma, rhinitis and chronic cough.  We have been evaluating her most recently for progressive exertional dyspnea and exertional hypoxemia that have been out of proportion to her asthma.  Initial echocardiogram showed normal RV size and function.  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  This prompted me to obtain an echo bubble study which was done on 09/24/2022  Echocardiogram with bubble study 09/24/2022 report reviewed by me showed a positive saline study with agitated bubbles evident at 3-6 cardiac cycles.  Consider intra-atrial shunting, ASD.  ROV 11/19/22 --follow-up visit 77 year old woman with dyspnea.  She has mild intermittent asthma, chronic rhinitis with some associated chronic cough.  We have been evaluating hypoxemia and dyspnea on proportion to her obstructive lung disease.  She has normal RV size and function but bubble study was positive for shunting.  Her TEE was done 10/22/2022.  There was no ASD.  They were able to visualize the right upper pulmonary vein that showed bubbles coming into the left atrium through  the pulmonary vein which appears to be the source of her shunt.  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 She is using 2.5L/min w exertion.   ROV 01/14/2023 --Ms. [name] is 89 with a history of mild intermittent asthma, chronic rhinitis and some associated chronic cough.  She also has hypoxemia that was out of proportion to her obstructive lung disease.  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  She went for interventional occlusion in IR.  She reports that now    Objective:   Physical Exam Vitals:   01/14/23 0844  BP: 108/62  Pulse: 76  Temp: 97.7 F (36.5 C)  SpO2: 98%     Gen: Pleasant, thin, in no distress,  normal affect  ENT: No lesions,  mouth clear,  oropharynx clear, no postnasal drip  Neck: No JVD, no stridor  Lungs: No use of accessory muscles, clear without rales or rhonchi  Cardiovascular: RRR, heart sounds normal, no murmur or gallops, no peripheral edema  Musculoskeletal: No deformities, no cyanosis or clubbing  Neuro: alert, non focal  Skin: Warm, no lesions or rash     Assessment & Plan:  No problem-specific Assessment & Plan notes found for this encounter.         Levy Pupa, MD, PhD 01/14/2023, 9:10 AM [facility] [phone] or if no answer [phone]

## 2023-01-14 NOTE — Patient Instructions (Signed)
I am so glad that you are feeling better after your IR procedure. We will perform a walking oximetry today to ensure that you do not have low oxygen levels.  If not then we will send an order for you to come off of supplemental oxygen. Keep your albuterol available to use 2 puffs when needed for shortness breath, chest tightness, wheezing Follow with Dr. Delton Coombes in 12 months or sooner if you have any problems.

## 2023-01-15 NOTE — Progress Notes (Signed)
ml LV SV MOD A4C:     64.9 ml LV SV MOD BP:      32.0 ml  RIGHT VENTRICLE RV S prime:     8.74 cm/s TAPSE (M-mode): 1.7 cm  LEFT ATRIUM             Index        RIGHT ATRIUM           Index LA diam:        2.40 cm 1.60 cm/m   RA Area:     10.80 cm LA Vol (A2C):   22.3 ml 14.86 ml/m  RA Volume:   24.50 ml  16.32 ml/m LA Vol (A4C):   13.6 ml 9.06 ml/m LA Biplane Vol: 17.4 ml 11.59 ml/m AORTIC VALVE LVOT Vmax:   98.10 cm/s LVOT Vmean:  67.300 cm/s LVOT VTI:    0.215 m  AORTA Ao Root diam: 2.90 cm Ao Asc diam:  3.00 cm  MITRAL VALVE               TRICUSPID VALVE MV Area (PHT): 3.60 cm    TR Peak grad:   9.9 mmHg MV Decel Time: 211 msec    TR Vmax:        157.00 cm/s MV E velocity: 64.90 cm/s MV A velocity: 47.20 cm/s  SHUNTS MV E/A ratio:  1.38        Systemic VTI:  0.22 m Systemic Diam: 1.80 cm  Olga Millers MD Electronically signed by Olga Millers MD Signature Date/Time: 09/24/2022/5:09:37 PM    Final   TEE  ECHO TEE 10/22/2022  Narrative TRANSESOPHOGEAL ECHO REPORT    Patient Name:   [name] Date of Exam: 10/22/2022 Medical Rec #:  [id number]       Height:       62.0 in Accession #:    [id number]      Weight:       111.0 lb Date of Birth:  01-20-1946       BSA:          1.488 m Patient Age:    76 years        BP:           137/75 mmHg Patient Gender: F               HR:           70 bpm. Exam Location:  Inpatient  Procedure: Transesophageal Echo, Color Doppler, Cardiac Doppler and Saline Contrast Bubble Study  Indications:     R06.9 DOE  History:         Patient has prior history of Echocardiogram  examinations, most recent 09/24/2022. CAD, COPD; Risk Factors:Dyslipidemia.  Sonographer:     Irving Burton Senior RDCS Referring Phys:  [id number] Tacey Ruiz DUNN Diagnosing Phys: Epifanio Lesches MD  PROCEDURE: After discussion of the risks and benefits of a TEE, an informed consent was obtained from the patient. The transesophogeal probe was passed without difficulty through the esophogus of the patient. Sedation performed by different physician. The patient was monitored while under deep sedation. Anesthestetic sedation was provided intravenously by Anesthesiology: 208mg  of Propofol. The patient developed no complications during the procedure.  IMPRESSIONS   1. Left ventricular ejection fraction, by estimation, is 60 to 65%. The left ventricle has normal function. The left ventricle has no regional wall motion abnormalities. 2. Right ventricular systolic function is normal. The right ventricular size is normal. 3. No left atrial/left atrial appendage thrombus  ml LV SV MOD A4C:     64.9 ml LV SV MOD BP:      32.0 ml  RIGHT VENTRICLE RV S prime:     8.74 cm/s TAPSE (M-mode): 1.7 cm  LEFT ATRIUM             Index        RIGHT ATRIUM           Index LA diam:        2.40 cm 1.60 cm/m   RA Area:     10.80 cm LA Vol (A2C):   22.3 ml 14.86 ml/m  RA Volume:   24.50 ml  16.32 ml/m LA Vol (A4C):   13.6 ml 9.06 ml/m LA Biplane Vol: 17.4 ml 11.59 ml/m AORTIC VALVE LVOT Vmax:   98.10 cm/s LVOT Vmean:  67.300 cm/s LVOT VTI:    0.215 m  AORTA Ao Root diam: 2.90 cm Ao Asc diam:  3.00 cm  MITRAL VALVE               TRICUSPID VALVE MV Area (PHT): 3.60 cm    TR Peak grad:   9.9 mmHg MV Decel Time: 211 msec    TR Vmax:        157.00 cm/s MV E velocity: 64.90 cm/s MV A velocity: 47.20 cm/s  SHUNTS MV E/A ratio:  1.38        Systemic VTI:  0.22 m Systemic Diam: 1.80 cm  Olga Millers MD Electronically signed by Olga Millers MD Signature Date/Time: 09/24/2022/5:09:37 PM    Final   TEE  ECHO TEE 10/22/2022  Narrative TRANSESOPHOGEAL ECHO REPORT    Patient Name:   [name] Date of Exam: 10/22/2022 Medical Rec #:  [id number]       Height:       62.0 in Accession #:    [id number]      Weight:       111.0 lb Date of Birth:  01-20-1946       BSA:          1.488 m Patient Age:    76 years        BP:           137/75 mmHg Patient Gender: F               HR:           70 bpm. Exam Location:  Inpatient  Procedure: Transesophageal Echo, Color Doppler, Cardiac Doppler and Saline Contrast Bubble Study  Indications:     R06.9 DOE  History:         Patient has prior history of Echocardiogram  examinations, most recent 09/24/2022. CAD, COPD; Risk Factors:Dyslipidemia.  Sonographer:     Irving Burton Senior RDCS Referring Phys:  [id number] Tacey Ruiz DUNN Diagnosing Phys: Epifanio Lesches MD  PROCEDURE: After discussion of the risks and benefits of a TEE, an informed consent was obtained from the patient. The transesophogeal probe was passed without difficulty through the esophogus of the patient. Sedation performed by different physician. The patient was monitored while under deep sedation. Anesthestetic sedation was provided intravenously by Anesthesiology: 208mg  of Propofol. The patient developed no complications during the procedure.  IMPRESSIONS   1. Left ventricular ejection fraction, by estimation, is 60 to 65%. The left ventricle has normal function. The left ventricle has no regional wall motion abnormalities. 2. Right ventricular systolic function is normal. The right ventricular size is normal. 3. No left atrial/left atrial appendage thrombus  ml LV SV MOD A4C:     64.9 ml LV SV MOD BP:      32.0 ml  RIGHT VENTRICLE RV S prime:     8.74 cm/s TAPSE (M-mode): 1.7 cm  LEFT ATRIUM             Index        RIGHT ATRIUM           Index LA diam:        2.40 cm 1.60 cm/m   RA Area:     10.80 cm LA Vol (A2C):   22.3 ml 14.86 ml/m  RA Volume:   24.50 ml  16.32 ml/m LA Vol (A4C):   13.6 ml 9.06 ml/m LA Biplane Vol: 17.4 ml 11.59 ml/m AORTIC VALVE LVOT Vmax:   98.10 cm/s LVOT Vmean:  67.300 cm/s LVOT VTI:    0.215 m  AORTA Ao Root diam: 2.90 cm Ao Asc diam:  3.00 cm  MITRAL VALVE               TRICUSPID VALVE MV Area (PHT): 3.60 cm    TR Peak grad:   9.9 mmHg MV Decel Time: 211 msec    TR Vmax:        157.00 cm/s MV E velocity: 64.90 cm/s MV A velocity: 47.20 cm/s  SHUNTS MV E/A ratio:  1.38        Systemic VTI:  0.22 m Systemic Diam: 1.80 cm  Olga Millers MD Electronically signed by Olga Millers MD Signature Date/Time: 09/24/2022/5:09:37 PM    Final   TEE  ECHO TEE 10/22/2022  Narrative TRANSESOPHOGEAL ECHO REPORT    Patient Name:   [name] Date of Exam: 10/22/2022 Medical Rec #:  [id number]       Height:       62.0 in Accession #:    [id number]      Weight:       111.0 lb Date of Birth:  01-20-1946       BSA:          1.488 m Patient Age:    76 years        BP:           137/75 mmHg Patient Gender: F               HR:           70 bpm. Exam Location:  Inpatient  Procedure: Transesophageal Echo, Color Doppler, Cardiac Doppler and Saline Contrast Bubble Study  Indications:     R06.9 DOE  History:         Patient has prior history of Echocardiogram  examinations, most recent 09/24/2022. CAD, COPD; Risk Factors:Dyslipidemia.  Sonographer:     Irving Burton Senior RDCS Referring Phys:  [id number] Tacey Ruiz DUNN Diagnosing Phys: Epifanio Lesches MD  PROCEDURE: After discussion of the risks and benefits of a TEE, an informed consent was obtained from the patient. The transesophogeal probe was passed without difficulty through the esophogus of the patient. Sedation performed by different physician. The patient was monitored while under deep sedation. Anesthestetic sedation was provided intravenously by Anesthesiology: 208mg  of Propofol. The patient developed no complications during the procedure.  IMPRESSIONS   1. Left ventricular ejection fraction, by estimation, is 60 to 65%. The left ventricle has normal function. The left ventricle has no regional wall motion abnormalities. 2. Right ventricular systolic function is normal. The right ventricular size is normal. 3. No left atrial/left atrial appendage thrombus  ml LV SV MOD A4C:     64.9 ml LV SV MOD BP:      32.0 ml  RIGHT VENTRICLE RV S prime:     8.74 cm/s TAPSE (M-mode): 1.7 cm  LEFT ATRIUM             Index        RIGHT ATRIUM           Index LA diam:        2.40 cm 1.60 cm/m   RA Area:     10.80 cm LA Vol (A2C):   22.3 ml 14.86 ml/m  RA Volume:   24.50 ml  16.32 ml/m LA Vol (A4C):   13.6 ml 9.06 ml/m LA Biplane Vol: 17.4 ml 11.59 ml/m AORTIC VALVE LVOT Vmax:   98.10 cm/s LVOT Vmean:  67.300 cm/s LVOT VTI:    0.215 m  AORTA Ao Root diam: 2.90 cm Ao Asc diam:  3.00 cm  MITRAL VALVE               TRICUSPID VALVE MV Area (PHT): 3.60 cm    TR Peak grad:   9.9 mmHg MV Decel Time: 211 msec    TR Vmax:        157.00 cm/s MV E velocity: 64.90 cm/s MV A velocity: 47.20 cm/s  SHUNTS MV E/A ratio:  1.38        Systemic VTI:  0.22 m Systemic Diam: 1.80 cm  Olga Millers MD Electronically signed by Olga Millers MD Signature Date/Time: 09/24/2022/5:09:37 PM    Final   TEE  ECHO TEE 10/22/2022  Narrative TRANSESOPHOGEAL ECHO REPORT    Patient Name:   [name] Date of Exam: 10/22/2022 Medical Rec #:  [id number]       Height:       62.0 in Accession #:    [id number]      Weight:       111.0 lb Date of Birth:  01-20-1946       BSA:          1.488 m Patient Age:    76 years        BP:           137/75 mmHg Patient Gender: F               HR:           70 bpm. Exam Location:  Inpatient  Procedure: Transesophageal Echo, Color Doppler, Cardiac Doppler and Saline Contrast Bubble Study  Indications:     R06.9 DOE  History:         Patient has prior history of Echocardiogram  examinations, most recent 09/24/2022. CAD, COPD; Risk Factors:Dyslipidemia.  Sonographer:     Irving Burton Senior RDCS Referring Phys:  [id number] Tacey Ruiz DUNN Diagnosing Phys: Epifanio Lesches MD  PROCEDURE: After discussion of the risks and benefits of a TEE, an informed consent was obtained from the patient. The transesophogeal probe was passed without difficulty through the esophogus of the patient. Sedation performed by different physician. The patient was monitored while under deep sedation. Anesthestetic sedation was provided intravenously by Anesthesiology: 208mg  of Propofol. The patient developed no complications during the procedure.  IMPRESSIONS   1. Left ventricular ejection fraction, by estimation, is 60 to 65%. The left ventricle has normal function. The left ventricle has no regional wall motion abnormalities. 2. Right ventricular systolic function is normal. The right ventricular size is normal. 3. No left atrial/left atrial appendage thrombus  ml LV SV MOD A4C:     64.9 ml LV SV MOD BP:      32.0 ml  RIGHT VENTRICLE RV S prime:     8.74 cm/s TAPSE (M-mode): 1.7 cm  LEFT ATRIUM             Index        RIGHT ATRIUM           Index LA diam:        2.40 cm 1.60 cm/m   RA Area:     10.80 cm LA Vol (A2C):   22.3 ml 14.86 ml/m  RA Volume:   24.50 ml  16.32 ml/m LA Vol (A4C):   13.6 ml 9.06 ml/m LA Biplane Vol: 17.4 ml 11.59 ml/m AORTIC VALVE LVOT Vmax:   98.10 cm/s LVOT Vmean:  67.300 cm/s LVOT VTI:    0.215 m  AORTA Ao Root diam: 2.90 cm Ao Asc diam:  3.00 cm  MITRAL VALVE               TRICUSPID VALVE MV Area (PHT): 3.60 cm    TR Peak grad:   9.9 mmHg MV Decel Time: 211 msec    TR Vmax:        157.00 cm/s MV E velocity: 64.90 cm/s MV A velocity: 47.20 cm/s  SHUNTS MV E/A ratio:  1.38        Systemic VTI:  0.22 m Systemic Diam: 1.80 cm  Olga Millers MD Electronically signed by Olga Millers MD Signature Date/Time: 09/24/2022/5:09:37 PM    Final   TEE  ECHO TEE 10/22/2022  Narrative TRANSESOPHOGEAL ECHO REPORT    Patient Name:   [name] Date of Exam: 10/22/2022 Medical Rec #:  [id number]       Height:       62.0 in Accession #:    [id number]      Weight:       111.0 lb Date of Birth:  01-20-1946       BSA:          1.488 m Patient Age:    76 years        BP:           137/75 mmHg Patient Gender: F               HR:           70 bpm. Exam Location:  Inpatient  Procedure: Transesophageal Echo, Color Doppler, Cardiac Doppler and Saline Contrast Bubble Study  Indications:     R06.9 DOE  History:         Patient has prior history of Echocardiogram  examinations, most recent 09/24/2022. CAD, COPD; Risk Factors:Dyslipidemia.  Sonographer:     Irving Burton Senior RDCS Referring Phys:  [id number] Tacey Ruiz DUNN Diagnosing Phys: Epifanio Lesches MD  PROCEDURE: After discussion of the risks and benefits of a TEE, an informed consent was obtained from the patient. The transesophogeal probe was passed without difficulty through the esophogus of the patient. Sedation performed by different physician. The patient was monitored while under deep sedation. Anesthestetic sedation was provided intravenously by Anesthesiology: 208mg  of Propofol. The patient developed no complications during the procedure.  IMPRESSIONS   1. Left ventricular ejection fraction, by estimation, is 60 to 65%. The left ventricle has normal function. The left ventricle has no regional wall motion abnormalities. 2. Right ventricular systolic function is normal. The right ventricular size is normal. 3. No left atrial/left atrial appendage thrombus  a moderate ostial stenosis due to jailing of the branch by the LAD stent. 2. The Circumflex is a small caliber vessel with a small distal obtuse marginal branch. The obtuse marginal branch has a 99% stenosis. This is too small for PCI and unchanged from her cath in 2012. 3. The RCA is a large dominant vessel with a patent mid vessel stent. The stent has no restenosis. The  distal RCA has mild plaque. 4. Normal LV systolic function  Recommendations: Continue medical therapy.  Findings Coronary Findings Diagnostic  Dominance: Right  Left Anterior Descending Vessel is large. Prox LAD lesion is 10% stenosed. The lesion was previously treated using a drug eluting stent over 2 years ago.  First Diagonal Branch Vessel is moderate in size. Ost 1st Diag lesion is 60% stenosed.  Left Circumflex Vessel is small.  Second Obtuse Marginal Branch Vessel is small in size. Ost 2nd Mrg lesion is 99% stenosed.  Right Coronary Artery Vessel is large. Previously placed Prox RCA to Mid RCA stent (unknown type) is widely patent. Mid RCA lesion is 20% stenosed.  Intervention  No interventions have been documented.   STRESS TESTS  MYOCARDIAL PERFUSION IMAGING 05/08/2017  Narrative  Nuclear stress EF: 78%. No wall motion abnormalities.  There was no ST segment deviation noted during stress.  Defect 1: There is a small defect of mild severity present in the basal anterolateral location. There is an apical anterior defect seen at rest, not stress.  Overall low risk nuclear stress test with no areas of significant ischemia identified. It is possible that the basal anterolateral defect is artifactual.  Donato Schultz, MD   ECHOCARDIOGRAM  ECHOCARDIOGRAM COMPLETE BUBBLE STUDY 09/24/2022  Narrative ECHOCARDIOGRAM REPORT    Patient Name:   LATERIA PLAUT Date of Exam: 09/24/2022 Medical Rec #:  [id number]       Height:       62.5 in Accession #:    [id number]      Weight:       111.6 lb Date of Birth:  [birth year]       BSA:          1.501 m Patient Age:    76 years        BP:           122/76 mmHg Patient Gender: F               HR:           70 bpm. Exam Location:  Outpatient  Procedure: 2D Echo, Cardiac Doppler, Color Doppler, Saline Contrast Bubble Study and Strain Analysis  Indications:    Dyspnea [213086]  History:        Patient has prior history  of Echocardiogram examinations, most recent 07/14/2022. Previous Myocardial Infarction and CAD, TIA and COPD; Signs/Symptoms:Dyspnea.  Sonographer:    Eulah Pont RDCS Referring Phys: 10 ROBERT S BYRUM   Sonographer Comments: Global longitudinal strain was attempted. IMPRESSIONS   1. Markedly positive saline microcavitation study; suggest TEE to RO ASD. 2. Left ventricular ejection fraction, by estimation, is 60 to 65%. The left ventricle has normal function. The left ventricle has no regional wall motion abnormalities. Left ventricular diastolic parameters are consistent with Grade I diastolic dysfunction (impaired relaxation). The average left ventricular global longitudinal strain is -16.3 %. The global longitudinal strain is normal. 3. Right ventricular systolic function is normal. The right ventricular size is normal. Tricuspid regurgitation signal is inadequate for assessing PA pressure. 4. The mitral valve

## 2023-01-16 ENCOUNTER — Ambulatory Visit: Payer: Medicare Other | Attending: Physician Assistant | Admitting: Cardiology

## 2023-01-16 ENCOUNTER — Encounter: Payer: Self-pay | Admitting: Cardiology

## 2023-01-16 VITALS — BP 108/54 | HR 83 | Ht 62.5 in | Wt 110.0 lb

## 2023-01-16 DIAGNOSIS — I2510 Atherosclerotic heart disease of native coronary artery without angina pectoris: Secondary | ICD-10-CM | POA: Diagnosis not present

## 2023-01-16 DIAGNOSIS — J9611 Chronic respiratory failure with hypoxia: Secondary | ICD-10-CM

## 2023-01-16 DIAGNOSIS — I493 Ventricular premature depolarization: Secondary | ICD-10-CM

## 2023-01-16 DIAGNOSIS — I471 Supraventricular tachycardia, unspecified: Secondary | ICD-10-CM

## 2023-01-16 DIAGNOSIS — I491 Atrial premature depolarization: Secondary | ICD-10-CM | POA: Diagnosis not present

## 2023-01-16 NOTE — Patient Instructions (Signed)
Medication Instructions:  Your physician recommends that you continue on your current medications as directed. Please refer to the Current Medication list given to you today.  *If you need a refill on your cardiac medications before your next appointment, please call your pharmacy*  Lab Work: None ordered today. If you have labs (blood work) drawn today and your tests are completely normal, you will receive your results only by: MyChart Message (if you have MyChart) OR A paper copy in the mail If you have any lab test that is abnormal or we need to change your treatment, we will call you to review the results.  Testing/Procedures: None ordered today.  Follow-Up: At [facility], you and your health needs are our priority.  As part of our continuing mission to provide you with exceptional heart care, we have created designated Provider Care Teams.  These Care Teams include your primary Cardiologist (physician) and Advanced Practice Providers (APPs -  Physician Assistants and Nurse Practitioners) who all work together to provide you with the care you need, when you need it.  Your next appointment:   3 month(s)  The format for your next appointment:   In Person  Provider:   Verne Carrow, MD {

## 2023-01-22 NOTE — Assessment & Plan Note (Signed)
She had significant dyspnea and also hypoxemia out of proportion to her known obstructive lung disease.  Since she has gone for embolization and closure with IR she is remarkably improved.  She is interested in trying to come off of oxygen and we will walk her today to see if she desaturates.  I am so glad that you are feeling better after your IR procedure. We will perform a walking oximetry today to ensure that you do not have low oxygen levels.  If not then we will send an order for you to come off of supplemental oxygen. Follow with Dr. Delton Coombes in 12 months or sooner if you have any problems.

## 2023-01-22 NOTE — Assessment & Plan Note (Signed)
Mild intermittent asthma, well-controlled at this time.  Continue same plan.  Albuterol as needed

## 2023-02-02 DIAGNOSIS — I700 Atherosclerosis of aorta: Secondary | ICD-10-CM | POA: Diagnosis not present

## 2023-02-02 DIAGNOSIS — Z23 Encounter for immunization: Secondary | ICD-10-CM | POA: Diagnosis not present

## 2023-02-02 DIAGNOSIS — E46 Unspecified protein-calorie malnutrition: Secondary | ICD-10-CM | POA: Diagnosis not present

## 2023-02-02 DIAGNOSIS — Z0000 Encounter for general adult medical examination without abnormal findings: Secondary | ICD-10-CM | POA: Diagnosis not present

## 2023-02-02 DIAGNOSIS — J479 Bronchiectasis, uncomplicated: Secondary | ICD-10-CM | POA: Diagnosis not present

## 2023-02-02 DIAGNOSIS — J449 Chronic obstructive pulmonary disease, unspecified: Secondary | ICD-10-CM | POA: Diagnosis not present

## 2023-02-23 ENCOUNTER — Other Ambulatory Visit: Payer: Self-pay

## 2023-02-23 DIAGNOSIS — I2510 Atherosclerotic heart disease of native coronary artery without angina pectoris: Secondary | ICD-10-CM

## 2023-02-23 MED ORDER — CLOPIDOGREL BISULFATE 75 MG PO TABS
75.0000 mg | ORAL_TABLET | Freq: Every day | ORAL | 3 refills | Status: DC
Start: 2023-02-23 — End: 2024-02-02

## 2023-03-25 NOTE — Addendum Note (Signed)
Encounter addended by: Elsworth Soho on: 03/25/2023 9:57 AM  Actions taken: Imaging Exam ended

## 2023-04-06 ENCOUNTER — Other Ambulatory Visit: Payer: Self-pay | Admitting: Physician Assistant

## 2023-04-06 NOTE — Progress Notes (Unsigned)
No chief complaint on file.  History of Present Illness: 77 yo female with history of CAD, chronic respiratory failure secondary to COPD, right lower lobe pulmonary AV malformation, PACs, PVCs, SVT, asthma, remote TIA and GERD here today for cardiac follow up. She had a NSTEMI in 2012 and was found to have severe disease in the mid LAD and mid RCA. A drug eluting stent was placed in the mid LAD and a drug eluting stent was placed in the mid RCA. Echo February 2018 with normal LV size and function. She called our office with c/o palpitations 10/07/16. Cardiac monitor June 2018 with sinus rhythm, rare PVCs, frequent PACs with several 6 beat runs of SVT. Nuclear stress test in January 2019 with no ischemia. This was arranged given c/o of dyspnea and fatigue. Her fatigue did not resolve off of Crestor. She is now on Lipitor. She followed up in the pulmonary office and is felt to have COPD. Cardiac cath August 2019 with patent LAD and RCA stents, severe stenosis in a small caliber obtuse marginal branch that was too small for PCI. She did not tolerate Toprol or Imdur. She has been followed by Dr. Delton Coombes for chronic hypoxemic respiratory failure out of proportion to her asthma.  Echocardiogram March 2024 with LVEF=60-65%, no evidence of pulmonary HTN.  V/Q in 07/2022 was negative for thromboembolic disease.  High-resolution CT in 08/2022 showed question of large pulmonary AVM and scarring with bronchiectasis as well as coronary/aortic atherosclerosis.  Echo with bubble study May 2024 with LVEF=60-65%, grade 1 DD, normal RV function and positive bubble study. Dr. Delton Coombes recommended TEE to rule out ASD which showed LVEF of 60 to 65%, normal LV function, no evidence of ASD, + bubble study. She went to have CTA that confirmed pulmonary AVM with 5mm feeding artery. On 12/15/2022 she underwent embolization of the pulmonary arteriovenous malformation with a single microvascular plug with vascular and interventional radiology  specialists.  Her dyspnea resolved following this procedure.   She is here today for follow up. The patient denies any chest pain, dyspnea, palpitations, lower extremity edema, orthopnea, PND, dizziness, near syncope or syncope.   Primary Care Physician: Joycelyn Rua, MD  Past Medical History:  Diagnosis Date   Allergy    Arthritis    COPD (chronic obstructive pulmonary disease) (HCC)    Coronary artery disease August 2012   Drug eluting stent mid LAD, drug eluting stent mid RCA   Depression    Emphysema    Heart attack [facility]) August 2012   Migraine    Osteoporosis    TIA (transient ischemic attack) 1995    Past Surgical History:  Procedure Laterality Date   APPENDECTOMY  01/1956   CATARACT EXTRACTION     CORONARY ANGIOPLASTY WITH STENT PLACEMENT  2012   FOOT SURGERY Bilateral    HAND SURGERY Left    IR EMBO VENOUS NOT HEMORR HEMANG  INC GUIDE ROADMAPPING  12/15/2022   IR RADIOLOGIST EVAL & MGMT  11/20/2022   IR RADIOLOGIST EVAL & MGMT  01/13/2023   LEFT HEART CATH AND CORONARY ANGIOGRAPHY N/A 12/18/2017   Procedure: LEFT HEART CATH AND CORONARY ANGIOGRAPHY;  Surgeon: Kathleene Hazel, MD;  Location: [facility] INVASIVE CV LAB;  Service: Cardiovascular;  Laterality: N/A;   NASAL SEPTUM SURGERY     SHOULDER SURGERY Left    frozen shoulder   TEE WITHOUT CARDIOVERSION N/A 10/22/2022   Procedure: TRANSESOPHAGEAL ECHOCARDIOGRAM;  Surgeon: Little Ishikawa, MD;  Location: [facility] INVASIVE CV  LAB;  Service: Cardiovascular;  Laterality: N/A;   THORACOTOMY     TONSILLECTOMY      Current Outpatient Medications  Medication Sig Dispense Refill   acetaminophen (TYLENOL) 500 MG tablet Take 500-1,000 mg by mouth every 6 (six) hours as needed (headache.).     albuterol (VENTOLIN HFA) 108 (90 Base) MCG/ACT inhaler Inhale 2 puffs into the lungs every 6 (six) hours as needed for wheezing or shortness of breath. 8 g 6   aspirin 81 MG chewable tablet Chew 81 mg by mouth daily.      atorvastatin (LIPITOR) 40 MG tablet TAKE 1 TABLET BY MOUTH EVERY DAY (Patient taking differently: Take 40 mg by mouth every evening.) 90 tablet 1   Cholecalciferol (VITAMIN D) 2000 UNITS CAPS Take 4,000 Units by mouth in the morning.     clopidogrel (PLAVIX) 75 MG tablet Take 1 tablet (75 mg total) by mouth daily. 90 tablet 3   denosumab (PROLIA) 60 MG/ML SOLN injection Inject 60 mg into the skin every 6 (six) months. Administer in upper arm, thigh, or abdomen     loratadine (CLARITIN) 10 MG tablet Take 10 mg by mouth in the morning.     nitroGLYCERIN (NITROSTAT) 0.4 MG SL tablet PLACE 1 TABLET (0.4 MG TOTAL) UNDER THE TONGUE EVERY 5 (FIVE) MINUTES AS NEEDED FOR CHEST PAIN. 25 tablet 5   potassium chloride SA (KLOR-CON M) 20 MEQ tablet Take 2 tablets (40 mEq total) by mouth daily. 180 tablet 1   sertraline (ZOLOFT) 100 MG tablet Take 50 mg by mouth in the morning.     topiramate (TOPAMAX) 100 MG tablet Take 100 mg by mouth every evening.     zolpidem (AMBIEN) 10 MG tablet Take 10 mg by mouth at bedtime as needed for sleep.      No current facility-administered medications for this visit.    Allergies  Allergen Reactions   Naproxen Hives   Parabens Other (See Comments)    PT IS NOT SURE OF REACTION   Estradiol Hives   Tramadol Hcl Nausea And Vomiting    Social History   Socioeconomic History   Marital status: Married    Spouse name: Not on file   Number of children: 2   Years of education: Not on file   Highest education level: Not on file  Occupational History   Occupation: retired  Tobacco Use   Smoking status: Former    Current packs/day: 0.00    Average packs/day: 1 pack/day for 10.0 years (10.0 ttl pk-yrs)    Types: Cigarettes    Start date: 05/05/1961    Quit date: 05/06/1971    Years since quitting: 51.9   Smokeless tobacco: Never  Vaping Use   Vaping status: Never Used  Substance and Sexual Activity   Alcohol use: No   Drug use: No   Sexual activity: Not on file   Other Topics Concern   Not on file  Social History Narrative   Not on file   Social Determinants of Health   Financial Resource Strain: Not on file  Food Insecurity: Not on file  Transportation Needs: Not on file  Physical Activity: Not on file  Stress: Not on file  Social Connections: Unknown (09/17/2021)   Received from [facility], [facility]   Social Network    Social Network: Not on file  Intimate Partner Violence: Unknown (08/09/2021)   Received from [facility], [facility]   HITS    Physically Hurt: Not on file  Insult or Talk Down To: Not on file    Threaten Physical Harm: Not on file    Scream or Curse: Not on file    Family History  Problem Relation Age of Onset   Heart disease Paternal Grandmother    Heart disease Paternal Grandfather    Rheum arthritis Maternal Grandmother    Diabetes Maternal Grandmother    Rheum arthritis Maternal Grandfather    Lung cancer Maternal Uncle    Diabetes Mother    Diabetes Maternal Aunt     Review of Systems:  As stated in the HPI and otherwise negative.   There were no vitals taken for this visit.  Physical Examination: General: Well developed, well nourished, NAD  HEENT: OP clear, mucus membranes moist  SKIN: warm, dry. No rashes. Neuro: No focal deficits  Musculoskeletal: Muscle strength 5/5 all ext  Psychiatric: Mood and affect normal  Neck: No JVD, no carotid bruits, no thyromegaly, no lymphadenopathy.  Lungs:Clear bilaterally, no wheezes, rhonci, crackles Cardiovascular: Regular rate and rhythm. No murmurs, gallops or rubs. Abdomen:Soft. Bowel sounds present. Non-tender.  Extremities: No lower extremity edema. Pulses are 2 + in the bilateral DP/PT.  EKG:  EKG is *** ordered today. The ekg ordered today demonstrates   Recent Labs: 10/20/2022: TSH 3.290 12/15/2022: BUN 18; Creatinine, Ser 0.82; Hemoglobin 14.3; Platelets 283; Potassium 3.5; Sodium 141   Lipid Panel Primary care:    Wt Readings from  Last 3 Encounters:  01/16/23 49.9 kg  01/14/23 50.6 kg  12/15/22 49.9 kg    Assessment and Plan:   1. CAD without angina:  No chest pain suggestive of angina. She did not tolerate Toprol due to fatigue and did not tolerate Imdur. Continue ASA, Plavix and statin. Lipids well controlled per pt in primary care. LDL *** .     2. Premature atrial contractions/SVT: No palpitations.   Labs/ tests ordered today include:   No orders of the defined types were placed in this encounter.  Disposition:   F/U with me in one year  Signed, Verne Carrow, MD 04/06/2023 5:15 PM    [facility] [address] [place], [place], [place]  [address] Phone: [phone]; Fax: [phone]

## 2023-04-07 ENCOUNTER — Encounter: Payer: Self-pay | Admitting: Cardiovascular Disease

## 2023-04-07 ENCOUNTER — Ambulatory Visit: Payer: Medicare Other | Attending: Cardiovascular Disease | Admitting: Cardiovascular Disease

## 2023-04-07 VITALS — BP 116/62 | HR 72 | Ht 62.5 in | Wt 114.0 lb

## 2023-04-07 DIAGNOSIS — I491 Atrial premature depolarization: Secondary | ICD-10-CM | POA: Diagnosis not present

## 2023-04-07 DIAGNOSIS — I2510 Atherosclerotic heart disease of native coronary artery without angina pectoris: Secondary | ICD-10-CM

## 2023-04-07 MED ORDER — POTASSIUM CHLORIDE CRYS ER 20 MEQ PO TBCR: 40.0000 meq | EXTENDED_RELEASE_TABLET | Freq: Every day | ORAL | 3 refills | Status: DC

## 2023-04-07 MED ORDER — NITROGLYCERIN 0.4 MG SL SUBL: SUBLINGUAL_TABLET | SUBLINGUAL | 3 refills | Status: DC

## 2023-04-07 NOTE — Patient Instructions (Signed)
Medication Instructions:  No changes *If you need a refill on your cardiac medications before your next appointment, please call your pharmacy*   Lab Work: none   Testing/Procedures: none   Follow-Up: At [facility], you and your health needs are our priority.  As part of our continuing mission to provide you with exceptional heart care, we have created designated Provider Care Teams.  These Care Teams include your primary Cardiologist (physician) and Advanced Practice Providers (APPs -  Physician Assistants and Nurse Practitioners) who all work together to provide you with the care you need, when you need it.   Your next appointment:   12 month(s)  Provider:   Christopher McAlhany, MD   

## 2023-04-13 DIAGNOSIS — M8588 Other specified disorders of bone density and structure, other site: Secondary | ICD-10-CM | POA: Diagnosis not present

## 2023-05-11 DIAGNOSIS — M810 Age-related osteoporosis without current pathological fracture: Secondary | ICD-10-CM | POA: Diagnosis not present

## 2023-05-19 DIAGNOSIS — K080 Exfoliation of teeth due to systemic causes: Secondary | ICD-10-CM | POA: Diagnosis not present

## 2023-05-22 NOTE — Addendum Note (Signed)
Encounter addended by: Zettie Cooley, RT on: 05/22/2023 7:43 AM  Actions taken: Imaging Exam ended

## 2023-06-18 DIAGNOSIS — H52222 Regular astigmatism, left eye: Secondary | ICD-10-CM | POA: Diagnosis not present

## 2023-07-27 ENCOUNTER — Other Ambulatory Visit: Payer: Self-pay | Admitting: Interventional Radiology

## 2023-07-27 DIAGNOSIS — Q2730 Arteriovenous malformation, site unspecified: Secondary | ICD-10-CM

## 2023-07-31 ENCOUNTER — Ambulatory Visit
Admission: RE | Admit: 2023-07-31 | Discharge: 2023-07-31 | Disposition: A | Discharge status: Home / Self Care | Source: Ambulatory Visit | Attending: Interventional Radiology | Admitting: Interventional Radiology

## 2023-07-31 DIAGNOSIS — Q2572 Congenital pulmonary arteriovenous malformation: Secondary | ICD-10-CM | POA: Diagnosis not present

## 2023-07-31 DIAGNOSIS — Q2730 Arteriovenous malformation, site unspecified: Secondary | ICD-10-CM

## 2023-07-31 DIAGNOSIS — Z9889 Other specified postprocedural states: Secondary | ICD-10-CM | POA: Diagnosis not present

## 2023-07-31 MED ORDER — IOPAMIDOL (ISOVUE-370) INJECTION 76%
75.0000 mL | Freq: Once | INTRAVENOUS | Status: AC | PRN
  Administered 2023-07-31: 75 mL via INTRAVENOUS

## 2023-08-13 ENCOUNTER — Ambulatory Visit
Admission: RE | Admit: 2023-08-13 | Discharge: 2023-08-13 | Disposition: A | Discharge status: Home / Self Care | Source: Ambulatory Visit | Attending: Interventional Radiology | Admitting: Interventional Radiology

## 2023-08-13 DIAGNOSIS — Q2730 Arteriovenous malformation, site unspecified: Secondary | ICD-10-CM

## 2023-08-13 DIAGNOSIS — Q2572 Congenital pulmonary arteriovenous malformation: Secondary | ICD-10-CM | POA: Diagnosis not present

## 2023-08-13 HISTORY — PX: IR RADIOLOGIST EVAL & MGMT: IMG5224

## 2023-08-13 NOTE — Progress Notes (Signed)
 This encounter was conducted via the [employer] providing interactive audio and visual communication.  The patient provided verbal consent to conduct a virtual appointment.  The patient was located at their primary residence during this encounter.  Chief Complaint: Patient was seen in consultation today for pulmonary AVM at the request of [name]  Referring Physician(s): Oyuki Hogan Oconnor  History of Present Illness: [name] is a 78 y.o. 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  She underwent embolization utilizing a single microvascular plug on 12/15/2022.  We spoke over the phone today for her 6 month follow-up evaluation.   She states that she is feeling great.  Baseline O2 sats are now 98-100%.  Her fatigue and dyspnea remain resolved.   CTA 07/31/23: Interval embolization of right lower lobe pulmonary AVM, with occlusion and involution of the nidus and draining vein.  Past Medical History:  Diagnosis Date   Allergy    Arthritis    COPD (chronic obstructive pulmonary disease) (HCC)    Coronary artery disease August 2012   Drug eluting stent mid LAD, drug eluting stent mid RCA   Depression    Emphysema    Heart attack [facility]) August 2012   Migraine    Osteoporosis    TIA (transient ischemic attack) 1995    Past Surgical History:  Procedure Laterality Date   APPENDECTOMY  01/1956   CATARACT EXTRACTION     CORONARY ANGIOPLASTY WITH STENT PLACEMENT  2012   FOOT SURGERY Bilateral    HAND SURGERY Left    IR EMBO VENOUS NOT HEMORR HEMANG  INC GUIDE ROADMAPPING  12/15/2022   IR RADIOLOGIST EVAL & MGMT  11/20/2022   IR RADIOLOGIST EVAL & MGMT  01/13/2023   IR RADIOLOGIST EVAL & MGMT  08/13/2023   LEFT HEART CATH AND CORONARY ANGIOGRAPHY N/A 12/18/2017   Procedure: LEFT HEART CATH AND CORONARY ANGIOGRAPHY;  Surgeon: Kathleene Hazel, MD;  Location: [facility]  INVASIVE CV LAB;  Service: Cardiovascular;  Laterality: N/A;   NASAL SEPTUM SURGERY     SHOULDER SURGERY Left    frozen shoulder   TEE WITHOUT CARDIOVERSION N/A 10/22/2022   Procedure: TRANSESOPHAGEAL ECHOCARDIOGRAM;  Surgeon: Little Ishikawa, MD;  Location: [facility] INVASIVE CV LAB;  Service: Cardiovascular;  Laterality: N/A;   THORACOTOMY     TONSILLECTOMY      Allergies: Naproxen, Parabens, Estradiol, and Tramadol hcl  Medications: Prior to Admission medications   Medication Sig Start Date End Date Taking? Authorizing Provider  acetaminophen (TYLENOL) 500 MG tablet Take 500-1,000 mg by mouth every 6 (six) hours as needed (headache.).    [provider]  albuterol (VENTOLIN HFA) 108 (90 Base) MCG/ACT inhaler Inhale 2 puffs into the lungs every 6 (six) hours as needed for wheezing or shortness of breath. 10/01/22   Byrum, Les Pou, MD  aspirin 81 MG chewable tablet Chew 81 mg by mouth daily.    [provider]  atorvastatin (LIPITOR) 40 MG tablet TAKE 1 TABLET BY MOUTH EVERY DAY Patient taking differently: Take 40 mg by mouth every evening. 10/11/19   Kathleene Hazel, MD  Cholecalciferol (VITAMIN D) 2000 UNITS CAPS Take 4,000 Units by mouth in the morning.    [provider]  clopidogrel (PLAVIX) 75 MG tablet Take 1 tablet (75 mg total) by mouth daily. 02/23/23   Reather Littler D, NP  denosumab (PROLIA) 60 MG/ML SOLN injection Inject 60 mg into the skin  every 6 (six) months. Administer in upper arm, thigh, or abdomen    [provider]  loratadine (CLARITIN) 10 MG tablet Take 10 mg by mouth in the morning. 02/14/19   [provider]  nitroGLYCERIN (NITROSTAT) 0.4 MG SL tablet PLACE 1 TABLET (0.4 MG TOTAL) UNDER THE TONGUE EVERY 5 (FIVE) MINUTES AS NEEDED FOR CHEST PAIN. 04/07/23   Kathleene Hazel, MD  potassium chloride SA (KLOR-CON M) 20 MEQ tablet Take 2 tablets (40 mEq total) by mouth daily. 04/07/23   Kathleene Hazel, MD   sertraline (ZOLOFT) 100 MG tablet Take 50 mg by mouth in the morning. 04/06/16   [provider]  topiramate (TOPAMAX) 100 MG tablet Take 100 mg by mouth every evening.    [provider]  zolpidem (AMBIEN) 10 MG tablet Take 10 mg by mouth at bedtime as needed for sleep.     [provider]     Family History  Problem Relation Age of Onset   Heart disease Paternal Grandmother    Heart disease Paternal Grandfather    Rheum arthritis Maternal Grandmother    Diabetes Maternal Grandmother    Rheum arthritis Maternal Grandfather    Lung cancer Maternal Uncle    Diabetes Mother    Diabetes Maternal Aunt     Social History   Socioeconomic History   Marital status: Married    Spouse name: Not on file   Number of children: 2   Years of education: Not on file   Highest education level: Not on file  Occupational History   Occupation: retired  Tobacco Use   Smoking status: Former    Current packs/day: 0.00    Average packs/day: 1 pack/day for 10.0 years (10.0 ttl pk-yrs)    Types: Cigarettes    Start date: 05/05/1961    Quit date: 05/06/1971    Years since quitting: 52.3   Smokeless tobacco: Never  Vaping Use   Vaping status: Never Used  Substance and Sexual Activity   Alcohol use: No   Drug use: No   Sexual activity: Not on file  Other Topics Concern   Not on file  Social History Narrative   Not on file   Social Drivers of Health   Financial Resource Strain: Not on file  Food Insecurity: Not on file  Transportation Needs: Not on file  Physical Activity: Not on file  Stress: Not on file  Social Connections: Unknown (09/17/2021)   Received from [facility], [facility]   Social Network    Social Network: Not on file   Review of Systems: A 12 point ROS discussed and pertinent positives are indicated in the HPI above.  All other systems are negative.  Review of Systems  Vital Signs: There were no vitals taken for this visit.  Advance Care  Plan: The advanced care plan/surrogate decision maker was discussed at the time of visit and the patient did not wish to discuss or was not able to name a surrogate decision maker or provide an advance care plan.    Physical Exam Constitutional:      General: She is not in acute distress.    Appearance: Normal appearance. She is normal weight.  HENT:     Head: Normocephalic and atraumatic.  Eyes:     General: No scleral icterus. Pulmonary:     Effort: Pulmonary effort is normal.  Neurological:     Mental Status: She is alert and oriented to person, place, and time.  Psychiatric:  Behavior: Behavior normal.       Imaging: IR Radiologist Eval & Mgmt Result Date: 08/13/2023 EXAM: ESTABLISHED PATIENT OFFICE VISIT CHIEF COMPLAINT: SEE OFFICE NOTE IN EPIC HISTORY OF PRESENT ILLNESS: SEE OFFICE NOTE IN EPIC REVIEW OF SYSTEMS: SEE OFFICE NOTE IN EPIC PHYSICAL EXAMINATION: SEE OFFICE NOTE IN EPIC ASSESSMENT AND PLAN: SEE OFFICE NOTE IN EPIC Electronically Signed   By: Malachy Moan M.D.   On: 08/13/2023 15:54   CT Angio Chest Pulmonary Embolism (PE) W or WO Contrast Result Date: 07/31/2023 CLINICAL DATA:  Right lower lobe pulmonary AVM, status post transarterial embolization EXAM: CT ANGIOGRAPHY CHEST WITH CONTRAST TECHNIQUE: Multidetector CT imaging of the chest was performed using the standard protocol during bolus administration of intravenous contrast. Multiplanar CT image reconstructions and MIPs were obtained to evaluate the vascular anatomy. RADIATION DOSE REDUCTION: This exam was performed according to the departmental dose-optimization program which includes automated exposure control, adjustment of the mA and/or kV according to patient size and/or use of iterative reconstruction technique. CONTRAST:  75mL ISOVUE-370 IOPAMIDOL (ISOVUE-370) INJECTION 76% COMPARISON:  Preop 11/21/2022 FINDINGS: Cardiovascular: Heart size normal. No pericardial effusion. The RV is nondilated.  Satisfactory opacification of pulmonary arteries noted, and there is no evidence of pulmonary emboli. Interval deployment of embolization device in the proximal aspect of a medial branch of the anterior basal segment right lower lobe pulmonary artery, with complete occlusion. There has been dramatic involution of the previously noted nidus and ectatic draining vein, with only linear residual scar. No continued supply across the AVM. Remaining pulmonary arterial and venous branches unremarkable. LAD coronary calcifications. Adequate contrast opacification of the thoracic aorta with no evidence of dissection, aneurysm, or stenosis. There is classic 3-vessel brachiocephalic arch anatomy without proximal stenosis. Scattered calcified aortic plaque. Mediastinum/Nodes: No mass or adenopathy. Lungs/Pleura: No pleural effusion. No pneumothorax. Stable biapical pleuroparenchymal scarring, with some hilar retraction. Pulmonary emphysema. Upper Abdomen: No acute findings. Musculoskeletal: No chest wall abnormality. No acute or significant osseous findings. Review of the MIP images confirms the above findings. IMPRESSION: Interval embolization of right lower lobe pulmonary AVM, with occlusion and involution of the nidus and draining vein. Electronically Signed   By: Corlis Leak M.D.   On: 07/31/2023 13:32    Labs:  CBC: Recent Labs    10/20/22 1602 12/15/22 0754  WBC 7.9 6.7  HGB 13.6 14.3  HCT 42.2 44.8  PLT 324 283    COAGS: Recent Labs    12/15/22 0754  INR 1.1    BMP: Recent Labs    10/20/22 1602 10/30/22 1512 11/10/22 1214 12/15/22 0754  NA 140 140 139 141  Oconnor 3.5 3.9 4.1 3.5  CL 107* 106 105 110  CO2 22 21 20  21*  GLUCOSE 78 70 86 93  BUN 15 17 19 18   CALCIUM 9.4 9.2 9.6 9.0  CREATININE 0.82 0.97 0.88 0.82  GFRNONAA  --   --   --  >60    LIVER FUNCTION TESTS: No results for input(s): "BILITOT", "AST", "ALT", "ALKPHOS", "PROT", "ALBUMIN" in the last 8760 hours.  TUMOR MARKERS: No  results for input(s): "AFPTM", "CEA", "CA199", "CHROMGRNA" in the last 8760 hours.  Assessment and Plan:  78 year-old female with history of RLL pAVM now successfully treated.  The AVM is essentially completely resolved on CTA.  No further follow-up needed.  Mrs. Belnap knows to reach out if she ever has recurrent symptoms or any need for [place] in the future.     Electronically Signed:  Kandis Cocking Eisen Robenson 08/13/2023, 3:58 PM   I spent a total of  15 Minutes in face to face in clinical consultation, greater than 50% of which was counseling/coordinating care for RLL pAVM, subsequent encounter.

## 2023-09-29 DIAGNOSIS — Z1231 Encounter for screening mammogram for malignant neoplasm of breast: Secondary | ICD-10-CM | POA: Diagnosis not present

## 2023-09-29 DIAGNOSIS — Z01419 Encounter for gynecological examination (general) (routine) without abnormal findings: Secondary | ICD-10-CM | POA: Diagnosis not present

## 2023-09-29 DIAGNOSIS — F419 Anxiety disorder, unspecified: Secondary | ICD-10-CM | POA: Diagnosis not present

## 2023-10-12 DIAGNOSIS — L720 Epidermal cyst: Secondary | ICD-10-CM | POA: Diagnosis not present

## 2023-10-12 DIAGNOSIS — D225 Melanocytic nevi of trunk: Secondary | ICD-10-CM | POA: Diagnosis not present

## 2023-10-12 DIAGNOSIS — L821 Other seborrheic keratosis: Secondary | ICD-10-CM | POA: Diagnosis not present

## 2023-11-10 DIAGNOSIS — M810 Age-related osteoporosis without current pathological fracture: Secondary | ICD-10-CM | POA: Diagnosis not present

## 2024-02-02 ENCOUNTER — Other Ambulatory Visit: Payer: Self-pay | Admitting: Cardiology

## 2024-02-02 DIAGNOSIS — I2510 Atherosclerotic heart disease of native coronary artery without angina pectoris: Secondary | ICD-10-CM

## 2024-02-08 ENCOUNTER — Other Ambulatory Visit (HOSPITAL_COMMUNITY): Payer: Self-pay

## 2024-02-08 ENCOUNTER — Telehealth: Payer: Self-pay | Admitting: Physician Assistant

## 2024-02-08 MED ORDER — POTASSIUM CHLORIDE CRYS ER 20 MEQ PO TBCR
40.0000 meq | EXTENDED_RELEASE_TABLET | Freq: Every day | ORAL | 0 refills | Status: DC
  Filled 2024-02-08: qty 180, 90d supply, fill #0

## 2024-02-08 NOTE — Telephone Encounter (Signed)
 Pt c/o medication issue:  1. Name of Medication: potassium chloride  SA (KLOR-CON  M) 20 MEQ tablet   2. How are you currently taking this medication (dosage and times per day)? Take 2 tablets (40 mEq total) by mouth daily.   3. Are you having a reaction (difficulty breathing--STAT)? No  4. What is your medication issue? Pt calling to state Rx is on backorder with her pharmacy. She has checked with other local pharmacies and they are on back order as well. She would like to know what she should do if she cannot have her Rx refilled. Please advise.

## 2024-02-08 NOTE — Telephone Encounter (Signed)
 Called pt advised can send prescription to the pharmacy in our building.  Pt is agreeable script sent to [facility] outpatient pharmacy [address]   Provided pt with pharmacy telephone number to call to see if insurance will cover med.  All questions answered.

## 2024-02-12 ENCOUNTER — Other Ambulatory Visit (HOSPITAL_COMMUNITY): Payer: Self-pay

## 2024-02-16 DIAGNOSIS — Z23 Encounter for immunization: Secondary | ICD-10-CM | POA: Diagnosis not present

## 2024-02-16 DIAGNOSIS — G4700 Insomnia, unspecified: Secondary | ICD-10-CM | POA: Diagnosis not present

## 2024-02-16 DIAGNOSIS — M810 Age-related osteoporosis without current pathological fracture: Secondary | ICD-10-CM | POA: Diagnosis not present

## 2024-02-16 DIAGNOSIS — Z1331 Encounter for screening for depression: Secondary | ICD-10-CM | POA: Diagnosis not present

## 2024-02-16 DIAGNOSIS — Z0000 Encounter for general adult medical examination without abnormal findings: Secondary | ICD-10-CM | POA: Diagnosis not present

## 2024-02-16 DIAGNOSIS — E782 Mixed hyperlipidemia: Secondary | ICD-10-CM | POA: Diagnosis not present

## 2024-02-16 DIAGNOSIS — Z131 Encounter for screening for diabetes mellitus: Secondary | ICD-10-CM | POA: Diagnosis not present

## 2024-02-16 DIAGNOSIS — G43109 Migraine with aura, not intractable, without status migrainosus: Secondary | ICD-10-CM | POA: Diagnosis not present

## 2024-04-19 ENCOUNTER — Encounter: Payer: Self-pay | Admitting: Cardiovascular Disease

## 2024-04-19 ENCOUNTER — Ambulatory Visit: Attending: Cardiovascular Disease | Admitting: Cardiovascular Disease

## 2024-04-19 VITALS — BP 124/68 | HR 75 | Ht 62.5 in | Wt 113.8 lb

## 2024-04-19 DIAGNOSIS — I2510 Atherosclerotic heart disease of native coronary artery without angina pectoris: Secondary | ICD-10-CM | POA: Diagnosis not present

## 2024-04-19 DIAGNOSIS — I491 Atrial premature depolarization: Secondary | ICD-10-CM | POA: Diagnosis not present

## 2024-04-19 DIAGNOSIS — I471 Supraventricular tachycardia, unspecified: Secondary | ICD-10-CM | POA: Diagnosis not present

## 2024-04-19 DIAGNOSIS — E7800 Pure hypercholesterolemia, unspecified: Secondary | ICD-10-CM

## 2024-04-19 MED ORDER — NITROGLYCERIN 0.4 MG SL SUBL: SUBLINGUAL_TABLET | SUBLINGUAL | 3 refills | Status: DC

## 2024-04-19 NOTE — Patient Instructions (Signed)
 Medication Instructions:  Your physician recommends that you continue on your current medications as directed. Please refer to the Current Medication list given to you today.  *If you need a refill on your cardiac medications before your next appointment, please call your pharmacy* No lab work needed at this time If you have labs (blood work) drawn today and your tests are completely normal, you will receive your results only by: MyChart Message (if you have MyChart) OR A paper copy in the mail If you have any lab test that is abnormal or we need to change your treatment, we will call you to review the results.  Testing/Procedures: none  Follow-Up: At [facility], you and your health needs are our priority.  As part of our continuing mission to provide you with exceptional heart care, our providers are all part of one team.  This team includes your primary Cardiologist (physician) and Advanced Practice Providers or APPs (Physician Assistants and Nurse Practitioners) who all work together to provide you with the care you need, when you need it.  Your next appointment:   12 month(s)  Provider:   Lonni Cash, MD    We recommend signing up for the patient portal called MyChart.  Sign up information is provided on this After Visit Summary.  MyChart is used to connect with patients for Virtual Visits (Telemedicine).  Patients are able to view lab/test results, encounter notes, upcoming appointments, etc.  Non-urgent messages can be sent to your provider as well.   To learn more about what you can do with MyChart, go to [email or web address].   Other Instructions

## 2024-04-19 NOTE — Progress Notes (Signed)
 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 She had a NSTEMI in 2012 and was found to have severe disease in the mid LAD and mid RCA. A drug eluting stent was placed in the mid LAD and a drug eluting stent was placed in the mid RCA. Echo February 2018 with normal LV size and function. Cardiac monitor June 2018 with sinus rhythm, rare PVCs, frequent PACs with several 6 beat runs of SVT. Nuclear stress test in January 2019 with no ischemia. This was arranged given c/o of dyspnea and fatigue. Her fatigue did not resolve off of Crestor . She is now on Lipitor. She followed up in the pulmonary office and is felt to have COPD. Cardiac cath August 2019 with patent LAD and RCA stents, severe stenosis in a small caliber obtuse marginal branch that was too small for PCI. She did not tolerate Toprol  or Imdur . She has been followed by Dr. Byrum for chronic hypoxemic respiratory failure out of proportion to her asthma.  Echocardiogram March 2024 with LVEF=60-65%, no evidence of pulmonary HTN.  V/Q in 07/2022 was negative for thromboembolic disease.  High-resolution CT in 08/2022 showed question of large pulmonary AVM and scarring with bronchiectasis as well as coronary/aortic atherosclerosis.  Echo with bubble study May 2024 with LVEF=60-65%, grade 1 DD, normal RV function and positive bubble study. Dr. Shelah recommended TEE to rule out ASD. TEE in June 2024 showed LVEF=60 to 65%, no evidence of ASD, + bubble study. She went to have CTA that confirmed pulmonary AVM with 5mm feeding artery. On 12/15/2022 she underwent embolization of the pulmonary arteriovenous malformation with a single microvascular plug with vascular and interventional radiology specialists.  Her dyspnea  resolved following this procedure.   She is here today for follow up. The patient denies any chest pain, dyspnea, palpitations, lower extremity edema, orthopnea, PND, dizziness, near syncope or syncope.   Primary Care Physician: Nanci Senior, MD  Past Medical History:  Diagnosis Date   Allergy    Arthritis    COPD (chronic obstructive pulmonary disease) (HCC)    Coronary artery disease August 2012   Drug eluting stent mid LAD, drug eluting stent mid RCA   Depression    Emphysema    Heart attack [facility]) August 2012   Migraine    Osteoporosis    TIA (transient ischemic attack) 1995    Past Surgical History:  Procedure Laterality Date   APPENDECTOMY  01/1956   CATARACT EXTRACTION     CORONARY ANGIOPLASTY WITH STENT PLACEMENT  2012   FOOT SURGERY Bilateral    HAND SURGERY Left    IR EMBO VENOUS NOT HEMORR HEMANG  INC GUIDE ROADMAPPING  12/15/2022   IR RADIOLOGIST EVAL & MGMT  11/20/2022   IR RADIOLOGIST EVAL & MGMT  01/13/2023   IR RADIOLOGIST EVAL & MGMT  08/13/2023   LEFT HEART CATH AND CORONARY ANGIOGRAPHY N/A 12/18/2017   Procedure: LEFT HEART CATH AND CORONARY ANGIOGRAPHY;  Surgeon: Verlin Lonni BIRCH, MD;  Location: [facility];  Service: Cardiovascular;  Laterality: N/A;   NASAL SEPTUM SURGERY     SHOULDER SURGERY Left    frozen shoulder   TEE WITHOUT CARDIOVERSION N/A 10/22/2022   Procedure: TRANSESOPHAGEAL ECHOCARDIOGRAM;  Surgeon: Kate,  Lonni CROME, MD;  Location: [facility];  Service: Cardiovascular;  Laterality: N/A;   THORACOTOMY     TONSILLECTOMY      Current Outpatient Medications  Medication Sig Dispense Refill   acetaminophen  (TYLENOL ) 500 MG tablet Take 500-1,000 mg by mouth every 6 (six) hours as needed (headache.).     albuterol  (VENTOLIN  HFA) 108 (90 Base) MCG/ACT inhaler Inhale 2 puffs into the lungs every 6 (six) hours as needed for wheezing or shortness of breath. 8 g 6   aspirin  81 MG chewable tablet Chew 81 mg by mouth daily.      atorvastatin  (LIPITOR) 40 MG tablet TAKE 1 TABLET BY MOUTH EVERY DAY (Patient taking differently: Take 40 mg by mouth every evening.) 90 tablet 1   Cholecalciferol (VITAMIN D) 2000 UNITS CAPS Take 4,000 Units by mouth in the morning.     clopidogrel  (PLAVIX ) 75 MG tablet TAKE 1 TABLET BY MOUTH DAILY 90 tablet 0   denosumab  (PROLIA ) 60 MG/ML SOLN injection Inject 60 mg into the skin every 6 (six) months. Administer in upper arm, thigh, or abdomen     loratadine (CLARITIN) 10 MG tablet Take 10 mg by mouth in the morning.     potassium chloride  SA (KLOR-CON  M) 20 MEQ tablet Take 2 tablets (40 mEq total) by mouth daily. 180 tablet 0   sertraline (ZOLOFT) 100 MG tablet Take 50 mg by mouth in the morning.     topiramate (TOPAMAX) 100 MG tablet Take 100 mg by mouth every evening.     zolpidem (AMBIEN) 10 MG tablet Take 10 mg by mouth at bedtime as needed for sleep.      nitroGLYCERIN  (NITROSTAT ) 0.4 MG SL tablet PLACE 1 TABLET (0.4 MG TOTAL) UNDER THE TONGUE EVERY 5 (FIVE) MINUTES AS NEEDED FOR CHEST PAIN. 25 tablet 3   No current facility-administered medications for this visit.    Allergies  Allergen Reactions   Azithromycin     Other Reaction(s): Diarrhea   Naproxen Hives   Parabens Other (See Comments)    PT IS NOT SURE OF REACTION   Estradiol Hives   Tramadol Hcl Nausea And Vomiting    Social History   Socioeconomic History   Marital status: Married    Spouse name: Not on file   Number of children: 2   Years of education: Not on file   Highest education level: Not on file  Occupational History   Occupation: retired  Tobacco Use   Smoking status: Former    Current packs/day: 0.00    Average packs/day: 1 pack/day for 10.0 years (10.0 ttl pk-yrs)    Types: Cigarettes    Start date: 05/05/1961    Quit date: 05/06/1971    Years since quitting: 52.9   Smokeless tobacco: Never  Vaping Use   Vaping status: Never Used  Substance and Sexual Activity   Alcohol use: No   Drug use: No    Sexual activity: Not on file  Other Topics Concern   Not on file  Social History Narrative   Not on file   Social Drivers of Health   Tobacco Use: Medium Risk (04/19/2024)   Patient History    Smoking Tobacco Use: Former    Smokeless Tobacco Use: Never    Passive Exposure: Not on Actuary Strain: Not on file  Food Insecurity: Not on file  Transportation Needs: Not on file  Physical Activity: Not on file  Stress: Not on file  Social Connections: Unknown (  09/17/2021)   [facility]   Social Network    Social Network: Not on file  Intimate Partner Violence: Unknown (08/09/2021)   Received from [facility]   HITS    Physically Hurt: Not on file    Insult or Talk Down To: Not on file    Threaten Physical Harm: Not on file    Scream or Curse: Not on file  Depression (PHQ2-9): Not on file  Alcohol Screen: Not on file  Housing: Not on file  Utilities: Not on file  Health Literacy: Not on file    Family History  Problem Relation Age of Onset   Heart disease Paternal Grandmother    Heart disease Paternal Grandfather    Rheum arthritis Maternal Grandmother    Diabetes Maternal Grandmother    Rheum arthritis Maternal Grandfather    Lung cancer Maternal Uncle    Diabetes Mother    Diabetes Maternal Aunt     Review of Systems:  As stated in the HPI and otherwise negative.   BP 124/68   Pulse 75   Ht 5' 2.5 (1.588 m)   Wt 113 lb 12.8 oz (51.6 kg)   SpO2 97%   BMI 20.48 kg/m   Physical Examination: General: Well developed, well nourished, NAD  HEENT: OP clear, mucus membranes moist  SKIN: warm, dry. No rashes. Neuro: No focal deficits  Musculoskeletal: Muscle strength 5/5 all ext  Psychiatric: Mood and affect normal  Neck: No JVD, no carotid bruits, no thyromegaly, no lymphadenopathy.  Lungs:Clear bilaterally, no wheezes, rhonci, crackles Cardiovascular: Regular rate and rhythm. No murmurs, gallops or rubs. Abdomen:Soft. Bowel  sounds present. Non-tender.  Extremities: No lower extremity edema. Pulses are 2 + in the bilateral DP/PT.  EKG:  EKG is ordered today. The ekg ordered today demonstrates  EKG Interpretation Date/Time:  Tuesday April 19 2024 10:21:17 EST Ventricular Rate:  75 PR Interval:  148 QRS Duration:  92 QT Interval:  384 QTC Calculation: 428 R Axis:   60  Text Interpretation: Normal sinus rhythm Normal ECG Confirmed by Verlin Bruckner [phone]) on 04/19/2024 10:31:10 AM   Recent Labs: No results found for requested labs within last 365 days.   Lipid Panel Primary care:    Wt Readings from Last 3 Encounters:  04/19/24 113 lb 12.8 oz (51.6 kg)  04/07/23 114 lb (51.7 kg)  01/16/23 110 lb (49.9 kg)    Assessment and Plan:   1. CAD without angina:  No chest pain. She did not tolerate Toprol  due to fatigue and did not tolerate Imdur .  -Continue ASA, Plavix  and Lipitor.      2. Premature atrial contractions/SVT: No palpitations.   3. HLD: Lipids followed in primary care. LDL 70 October 2025.  -Continue Lipitor  Labs/ tests ordered today include:  Orders Placed This Encounter  Procedures   EKG 12-Lead   Disposition:   F/U with me in one year  Signed, Bruckner Verlin, MD 04/19/2024 10:43 AM    [facility] [address] [place], [place], [place]  [address] Phone: [phone]; Fax: [phone]

## 2024-04-20 ENCOUNTER — Telehealth: Payer: Self-pay | Admitting: Cardiovascular Disease

## 2024-04-20 MED ORDER — NITROGLYCERIN 0.4 MG SL SUBL: SUBLINGUAL_TABLET | SUBLINGUAL | 3 refills | Status: AC

## 2024-04-20 NOTE — Telephone Encounter (Signed)
 Pt c/o medication issue:  1. Name of Medication: nitroGLYCERIN  (NITROSTAT ) 0.4 MG SL tablet   2. How are you currently taking this medication (dosage and times per day)? As written  3. Are you having a reaction (difficulty breathing--STAT)? No  4. What is your medication issue? Pharmacy would like to know if its okay to inform the pt to call 911 after taking 3 dosages of this medication.

## 2024-04-20 NOTE — Telephone Encounter (Signed)
Prescription updated and sent to pharmacy.

## 2024-04-29 ENCOUNTER — Other Ambulatory Visit (HOSPITAL_COMMUNITY): Payer: Self-pay

## 2024-04-30 ENCOUNTER — Other Ambulatory Visit (HOSPITAL_COMMUNITY): Payer: Self-pay

## 2024-05-02 ENCOUNTER — Other Ambulatory Visit: Payer: Self-pay

## 2024-05-02 DIAGNOSIS — I2510 Atherosclerotic heart disease of native coronary artery without angina pectoris: Secondary | ICD-10-CM

## 2024-05-03 MED ORDER — CLOPIDOGREL BISULFATE 75 MG PO TABS: 75.0000 mg | ORAL_TABLET | Freq: Every day | ORAL | 3 refills | Status: AC

## 2024-06-01 ENCOUNTER — Telehealth: Payer: Self-pay | Admitting: Cardiovascular Disease

## 2024-06-01 MED ORDER — POTASSIUM CHLORIDE CRYS ER 20 MEQ PO TBCR: 40.0000 meq | EXTENDED_RELEASE_TABLET | Freq: Every day | ORAL | 3 refills | Status: AC

## 2024-06-01 NOTE — Telephone Encounter (Signed)
" °*  STAT* If patient is at the pharmacy, call can be transferred to refill team.   1. Which medications need to be refilled? (please list name of each medication and dose if known)   potassium chloride  SA (KLOR-CON  M) 20 MEQ tablet    2. Would you like to learn more about the convenience, safety, & potential cost savings by using the [facility]? no   3. Are you open to using the [facility] (Type [facility]. no   4. Which pharmacy/location (including street and city if local pharmacy) is medication to be sent to?   [email or web address] - [place], [place] - [address]      5. Do they need a 30 day or 90 day supply? 90 day   "

## 2024-06-01 NOTE — Telephone Encounter (Signed)
 Refills has been sent to the pharmacy.
